# Patient Record
Sex: Female | Born: 1935 | Race: White | Hispanic: No | Smoking: Never smoker
Health system: Southern US, Community
[De-identification: ages and names within clinical notes are randomized; demographics above are authoritative.]

## PROBLEM LIST (undated history)

## (undated) DIAGNOSIS — E7800 Pure hypercholesterolemia, unspecified: Secondary | ICD-10-CM

## (undated) DIAGNOSIS — C801 Malignant (primary) neoplasm, unspecified: Secondary | ICD-10-CM

## (undated) DIAGNOSIS — M810 Age-related osteoporosis without current pathological fracture: Secondary | ICD-10-CM

## (undated) DIAGNOSIS — I10 Essential (primary) hypertension: Secondary | ICD-10-CM

## (undated) DIAGNOSIS — E119 Type 2 diabetes mellitus without complications: Secondary | ICD-10-CM

## (undated) HISTORY — PX: ABDOMINAL HYSTERECTOMY: SHX81

## (undated) HISTORY — PX: MASTECTOMY: SHX3

## (undated) HISTORY — PX: JOINT REPLACEMENT: SHX530

## (undated) HISTORY — PX: WRIST SURGERY: SHX841

## (undated) HISTORY — DX: Age-related osteoporosis without current pathological fracture: M81.0

---

## 2017-05-24 LAB — HM DIABETES EYE EXAM

## 2017-06-28 ENCOUNTER — Emergency Department (HOSPITAL_COMMUNITY): Payer: Medicare Other

## 2017-06-28 ENCOUNTER — Inpatient Hospital Stay (HOSPITAL_COMMUNITY)
Admission: EM | Admit: 2017-06-28 | Discharge: 2017-07-04 | DRG: 510 | Disposition: A | Discharge status: Skilled Nursing Facility | Payer: Medicare Other | Attending: Internal Medicine | Admitting: Internal Medicine

## 2017-06-28 ENCOUNTER — Encounter (HOSPITAL_COMMUNITY): Payer: Self-pay | Admitting: Emergency Medicine

## 2017-06-28 ENCOUNTER — Other Ambulatory Visit: Payer: Self-pay

## 2017-06-28 DIAGNOSIS — Z9221 Personal history of antineoplastic chemotherapy: Secondary | ICD-10-CM | POA: Diagnosis not present

## 2017-06-28 DIAGNOSIS — E785 Hyperlipidemia, unspecified: Secondary | ICD-10-CM | POA: Diagnosis not present

## 2017-06-28 DIAGNOSIS — T148XXA Other injury of unspecified body region, initial encounter: Secondary | ICD-10-CM | POA: Diagnosis not present

## 2017-06-28 DIAGNOSIS — Z01818 Encounter for other preprocedural examination: Secondary | ICD-10-CM

## 2017-06-28 DIAGNOSIS — S72112A Displaced fracture of greater trochanter of left femur, initial encounter for closed fracture: Secondary | ICD-10-CM | POA: Diagnosis not present

## 2017-06-28 DIAGNOSIS — Z853 Personal history of malignant neoplasm of breast: Secondary | ICD-10-CM

## 2017-06-28 DIAGNOSIS — Z419 Encounter for procedure for purposes other than remedying health state, unspecified: Secondary | ICD-10-CM

## 2017-06-28 DIAGNOSIS — N179 Acute kidney failure, unspecified: Secondary | ICD-10-CM | POA: Diagnosis not present

## 2017-06-28 DIAGNOSIS — D62 Acute posthemorrhagic anemia: Secondary | ICD-10-CM | POA: Diagnosis not present

## 2017-06-28 DIAGNOSIS — S72002A Fracture of unspecified part of neck of left femur, initial encounter for closed fracture: Secondary | ICD-10-CM

## 2017-06-28 DIAGNOSIS — I10 Essential (primary) hypertension: Secondary | ICD-10-CM | POA: Diagnosis present

## 2017-06-28 DIAGNOSIS — Y92009 Unspecified place in unspecified non-institutional (private) residence as the place of occurrence of the external cause: Secondary | ICD-10-CM

## 2017-06-28 DIAGNOSIS — F419 Anxiety disorder, unspecified: Secondary | ICD-10-CM | POA: Diagnosis present

## 2017-06-28 DIAGNOSIS — S72115A Nondisplaced fracture of greater trochanter of left femur, initial encounter for closed fracture: Secondary | ICD-10-CM | POA: Diagnosis present

## 2017-06-28 DIAGNOSIS — M9702XA Periprosthetic fracture around internal prosthetic left hip joint, initial encounter: Secondary | ICD-10-CM | POA: Diagnosis present

## 2017-06-28 DIAGNOSIS — S52022G Displaced fracture of olecranon process without intraarticular extension of left ulna, subsequent encounter for closed fracture with delayed healing: Secondary | ICD-10-CM | POA: Diagnosis not present

## 2017-06-28 DIAGNOSIS — Z9010 Acquired absence of unspecified breast and nipple: Secondary | ICD-10-CM

## 2017-06-28 DIAGNOSIS — S52022A Displaced fracture of olecranon process without intraarticular extension of left ulna, initial encounter for closed fracture: Secondary | ICD-10-CM | POA: Diagnosis not present

## 2017-06-28 DIAGNOSIS — W010XXA Fall on same level from slipping, tripping and stumbling without subsequent striking against object, initial encounter: Secondary | ICD-10-CM | POA: Diagnosis present

## 2017-06-28 DIAGNOSIS — Z7982 Long term (current) use of aspirin: Secondary | ICD-10-CM

## 2017-06-28 DIAGNOSIS — D649 Anemia, unspecified: Secondary | ICD-10-CM | POA: Diagnosis not present

## 2017-06-28 DIAGNOSIS — E119 Type 2 diabetes mellitus without complications: Secondary | ICD-10-CM | POA: Diagnosis not present

## 2017-06-28 DIAGNOSIS — F329 Major depressive disorder, single episode, unspecified: Secondary | ICD-10-CM | POA: Diagnosis present

## 2017-06-28 DIAGNOSIS — E782 Mixed hyperlipidemia: Secondary | ICD-10-CM

## 2017-06-28 DIAGNOSIS — Z7984 Long term (current) use of oral hypoglycemic drugs: Secondary | ICD-10-CM

## 2017-06-28 HISTORY — DX: Pure hypercholesterolemia, unspecified: E78.00

## 2017-06-28 HISTORY — DX: Type 2 diabetes mellitus without complications: E11.9

## 2017-06-28 HISTORY — DX: Malignant (primary) neoplasm, unspecified: C80.1

## 2017-06-28 HISTORY — DX: Essential (primary) hypertension: I10

## 2017-06-28 LAB — CBC WITH DIFFERENTIAL/PLATELET
BASOS ABS: 0 10*3/uL (ref 0.0–0.1)
Basophils Relative: 0 %
EOS PCT: 2 %
Eosinophils Absolute: 0.1 10*3/uL (ref 0.0–0.7)
HCT: 32.5 % — ABNORMAL LOW (ref 36.0–46.0)
Hemoglobin: 10.2 g/dL — ABNORMAL LOW (ref 12.0–15.0)
LYMPHS PCT: 10 %
Lymphs Abs: 0.7 10*3/uL (ref 0.7–4.0)
MCH: 29.2 pg (ref 26.0–34.0)
MCHC: 31.4 g/dL (ref 30.0–36.0)
MCV: 93.1 fL (ref 78.0–100.0)
MONO ABS: 0.5 10*3/uL (ref 0.1–1.0)
MONOS PCT: 7 %
Neutro Abs: 5.5 10*3/uL (ref 1.7–7.7)
Neutrophils Relative %: 81 %
PLATELETS: 180 10*3/uL (ref 150–400)
RBC: 3.49 MIL/uL — ABNORMAL LOW (ref 3.87–5.11)
RDW: 13.7 % (ref 11.5–15.5)
WBC: 6.8 10*3/uL (ref 4.0–10.5)

## 2017-06-28 LAB — BASIC METABOLIC PANEL
ANION GAP: 11 (ref 5–15)
BUN: 34 mg/dL — AB (ref 6–20)
CALCIUM: 8.7 mg/dL — AB (ref 8.9–10.3)
CO2: 26 mmol/L (ref 22–32)
CREATININE: 0.99 mg/dL (ref 0.44–1.00)
Chloride: 101 mmol/L (ref 101–111)
GFR calc Af Amer: >60 mL/min (ref >60)
GFR, EST NON AFRICAN AMERICAN: 53 mL/min — AB (ref >60)
GLUCOSE: 257 mg/dL — AB (ref 65–99)
Potassium: 4.1 mmol/L (ref 3.5–5.1)
Sodium: 138 mmol/L (ref 135–145)

## 2017-06-28 LAB — CBG MONITORING, ED: Glucose-Capillary: 225 mg/dL — ABNORMAL HIGH (ref 65–99)

## 2017-06-28 LAB — URINALYSIS, ROUTINE W REFLEX MICROSCOPIC
Bilirubin Urine: NEGATIVE
Glucose, UA: >=500 mg/dL — AB
Hgb urine dipstick: NEGATIVE
KETONES UR: NEGATIVE mg/dL
Nitrite: NEGATIVE
PROTEIN: NEGATIVE mg/dL
Specific Gravity, Urine: 1.02 (ref 1.005–1.030)
pH: 5 (ref 5.0–8.0)

## 2017-06-28 LAB — TYPE AND SCREEN
ABO/RH(D): O POS
Antibody Screen: NEGATIVE

## 2017-06-28 MED ORDER — SENNOSIDES-DOCUSATE SODIUM 8.6-50 MG PO TABS: 1.0000 | ORAL_TABLET | Freq: Every evening | ORAL | Status: DC | PRN

## 2017-06-28 MED ORDER — FENTANYL CITRATE (PF) 100 MCG/2ML IJ SOLN
50.0000 ug | Freq: Once | INTRAMUSCULAR | Status: AC
  Administered 2017-06-28: 50 ug via INTRAVENOUS
  Filled 2017-06-28: qty 2

## 2017-06-28 MED ORDER — INSULIN ASPART 100 UNIT/ML ~~LOC~~ SOLN
0.0000 [IU] | SUBCUTANEOUS | Status: DC
  Administered 2017-06-29 (×5): 3 [IU] via SUBCUTANEOUS
  Administered 2017-06-30: 5 [IU] via SUBCUTANEOUS
  Administered 2017-06-30 (×2): 2 [IU] via SUBCUTANEOUS
  Administered 2017-06-30: 3 [IU] via SUBCUTANEOUS
  Administered 2017-07-01: 8 [IU] via SUBCUTANEOUS
  Administered 2017-07-01: 3 [IU] via SUBCUTANEOUS
  Administered 2017-07-01 – 2017-07-02 (×3): 5 [IU] via SUBCUTANEOUS
  Administered 2017-07-02 (×3): 2 [IU] via SUBCUTANEOUS
  Administered 2017-07-03 (×3): 3 [IU] via SUBCUTANEOUS
  Administered 2017-07-03: 2 [IU] via SUBCUTANEOUS
  Administered 2017-07-03: 3 [IU] via SUBCUTANEOUS
  Administered 2017-07-03 – 2017-07-04 (×2): 2 [IU] via SUBCUTANEOUS
  Administered 2017-07-04: 3 [IU] via SUBCUTANEOUS

## 2017-06-28 MED ORDER — ESCITALOPRAM OXALATE 10 MG PO TABS
10.0000 mg | ORAL_TABLET | Freq: Every day | ORAL | Status: DC
  Administered 2017-06-29 – 2017-07-03 (×6): 10 mg via ORAL
  Filled 2017-06-28 (×6): qty 1

## 2017-06-28 MED ORDER — SODIUM CHLORIDE 0.9 % IV SOLN
INTRAVENOUS | Status: AC
  Administered 2017-06-29: 02:00:00 via INTRAVENOUS

## 2017-06-28 MED ORDER — FENTANYL CITRATE (PF) 100 MCG/2ML IJ SOLN
25.0000 ug | INTRAMUSCULAR | Status: AC | PRN
  Administered 2017-06-28 – 2017-06-29 (×3): 25 ug via INTRAVENOUS
  Filled 2017-06-28 (×3): qty 2

## 2017-06-28 MED ORDER — ADULT MULTIVITAMIN W/MINERALS CH
1.0000 | ORAL_TABLET | Freq: Every day | ORAL | Status: DC
  Administered 2017-06-30 – 2017-07-04 (×4): 1 via ORAL
  Filled 2017-06-28 (×4): qty 1

## 2017-06-28 MED ORDER — LORAZEPAM 0.5 MG PO TABS
0.5000 mg | ORAL_TABLET | Freq: Four times a day (QID) | ORAL | Status: DC | PRN
  Administered 2017-06-29 – 2017-07-04 (×5): 0.5 mg via ORAL
  Filled 2017-06-28 (×5): qty 1

## 2017-06-28 MED ORDER — HYDROCODONE-ACETAMINOPHEN 5-325 MG PO TABS
1.0000 | ORAL_TABLET | Freq: Four times a day (QID) | ORAL | Status: DC | PRN
  Administered 2017-06-29 – 2017-07-04 (×16): 2 via ORAL
  Filled 2017-06-28 (×17): qty 2

## 2017-06-28 MED ORDER — SIMVASTATIN 10 MG PO TABS
10.0000 mg | ORAL_TABLET | Freq: Every day | ORAL | Status: DC
  Administered 2017-06-29 – 2017-07-03 (×5): 10 mg via ORAL
  Filled 2017-06-28 (×5): qty 1

## 2017-06-28 MED ORDER — MORPHINE SULFATE (PF) 2 MG/ML IV SOLN
0.5000 mg | INTRAVENOUS | Status: DC | PRN
  Administered 2017-06-29 – 2017-07-02 (×4): 1 mg via INTRAVENOUS
  Filled 2017-06-28 (×4): qty 1

## 2017-06-28 MED ORDER — HYDRALAZINE HCL 20 MG/ML IJ SOLN: 10.0000 mg | INTRAMUSCULAR | Status: DC | PRN

## 2017-06-28 MED ORDER — BISACODYL 5 MG PO TBEC
5.0000 mg | DELAYED_RELEASE_TABLET | Freq: Every day | ORAL | Status: DC | PRN
  Administered 2017-07-03: 5 mg via ORAL
  Filled 2017-06-28: qty 1

## 2017-06-28 NOTE — ED Provider Notes (Signed)
[facility] EMERGENCY DEPARTMENT Provider Note   CSN: [id number] Arrival date & time: 06/28/17  1815     History   Chief Complaint Chief Complaint  Patient presents with  . Fall    HPI Aleesa Sweigert is a 81 y.o. female.  HPI  The patient is a 81 year old female with a known history of breast cancer diabetes high blood cholesterol and high blood pressure.  She is currently taking a daily aspirin.  She presents with family after she had a fall at home when she was walking on an uneven surface and fell forward striking the left side of her body including her left elbow, left hip and the left periorbital region.  This occurred approximately 1-1/2 hours ago, her symptoms were acute in onset, persistent, worse with movement of the left elbow and not associated with any loss of consciousness, headache, blurred vision, nausea, vomiting, changes in vision, chest pain shortness of breath abdominal pain.  She did suffer a slight abrasion to the knuckles of her left hand.  She was brought immediately to the hospital by her family members.  Past Medical History:  Diagnosis Date  . Cancer ([place])    breast  . Diabetes mellitus without complication ([place])   . High blood cholesterol   . Hypertension     Patient Active Problem List   Diagnosis Date Noted  . Diabetes mellitus without complication ([place]) 37/62/8315  . Hypertension 06/28/2017  . Closed left hip fracture, initial encounter ([place]) 06/28/2017  . Olecranon fracture, left, closed, initial encounter 06/28/2017    Past Surgical History:  Procedure Laterality Date  . ABDOMINAL HYSTERECTOMY    . JOINT REPLACEMENT    . MASTECTOMY    . WRIST SURGERY      OB History    No data available       Home Medications    Prior to Admission medications   Medication Sig Start Date End Date Taking? Authorizing Provider  aspirin EC 81 MG tablet Take 81 mg by mouth daily.   Yes [provider]  escitalopram (LEXAPRO) 10 MG tablet  Take 10 mg by mouth at bedtime.   Yes [provider]  glipiZIDE (GLUCOTROL) 10 MG tablet Take 10 mg by mouth 2 (two) times daily.   Yes [provider]  lisinopril-hydrochlorothiazide (PRINZIDE,ZESTORETIC) 20-12.5 MG tablet Take 1 tablet by mouth daily.   Yes [provider]  LORazepam (ATIVAN) 0.5 MG tablet Take 0.5 mg by mouth daily.   Yes [provider]  metFORMIN (GLUCOPHAGE) 1000 MG tablet Take 1,000 mg by mouth 2 (two) times daily.   Yes [provider]  Multiple Vitamin (MULTIVITAMIN WITH MINERALS) TABS tablet Take 1 tablet by mouth daily.   Yes [provider]  Multiple Vitamins-Minerals (PRESERVISION AREDS 2+MULTI VIT) CAPS Take 2 capsules by mouth daily.   Yes [provider]  pioglitazone (ACTOS) 30 MG tablet Take 30 mg by mouth daily.   Yes [provider]  simvastatin (ZOCOR) 10 MG tablet Take 10 mg by mouth daily.   Yes [provider]    Family History History reviewed. No pertinent family history.  Social History Social History   Tobacco Use  . Smoking status: Never Smoker  Substance Use Topics  . Alcohol use: No    Frequency: Never  . Drug use: No     Allergies   Penicillins   Review of Systems Review of Systems  All other systems reviewed and are negative.    Physical  Exam Updated Vital Signs BP (!) 157/79 (BP Location: Right Arm)   Pulse 81   Temp 98.2 F (36.8 C) (Oral)   Resp 20   Ht 5\' 7"  (1.702 m)   Wt 61.2 kg (135 lb)   SpO2 98%   BMI 21.14 kg/m   Physical Exam  Constitutional: She appears well-developed and well-nourished. No distress.  HENT:  Head: Normocephalic.  Mouth/Throat: Oropharynx is clear and moist. No oropharyngeal exudate.  Small hematoma to the left superior lateral periorbital region.  No malocclusion, no raccoon eyes, no battle sign  Eyes: Conjunctivae and EOM are normal. Pupils are equal, round, and reactive to light. Right eye exhibits no  discharge. Left eye exhibits no discharge. No scleral icterus.  Neck: Normal range of motion. Neck supple. No JVD present. No thyromegaly present.  Cardiovascular: Normal rate, regular rhythm, normal heart sounds and intact distal pulses. Exam reveals no gallop and no friction rub.  No murmur heard. Pulmonary/Chest: Effort normal and breath sounds normal. No respiratory distress. She has no wheezes. She has no rales.  Abdominal: Soft. Bowel sounds are normal. She exhibits no distension and no mass. There is no tenderness.  Musculoskeletal: Normal range of motion. She exhibits edema ( Bilateral mild symmetrical edema of the lower extremities), tenderness ( Tender to palpation over the left elbow and the left hip) and deformity ( Left elbow swelling and tenderness especially over the olecranon).  Minimal pain with pronation and supination of the elbow, there is no pain around the left shoulder, no pain at the left hand or wrist, there is tenderness with flexion and extension of the elbow.  She has normal range of motion of the right lower extremity, she has some difficulty flexing at the left hip and some pain with internal rotation though there is no leg length discrepancies.  Lymphadenopathy:    She has no cervical adenopathy.  Neurological: She is alert. Coordination normal.  No numbness or weakness.  Skin: Skin is warm and dry. No rash noted. No erythema.  Abrasion noted to knuckles, no other skin tears or lacerations  Psychiatric: She has a normal mood and affect. Her behavior is normal.  Nursing note and vitals reviewed.    ED Treatments / Results  Labs (all labs ordered are listed, but only abnormal results are displayed) Labs Reviewed  CBC WITH DIFFERENTIAL/PLATELET - Abnormal; Notable for the following components:      Result Value   RBC 3.49 (*)    Hemoglobin 10.2 (*)    HCT 32.5 (*)    All other components within normal limits  BASIC METABOLIC PANEL - Abnormal; Notable for the  following components:   Glucose, Bld 257 (*)    BUN 34 (*)    Calcium 8.7 (*)    GFR calc non Af Amer 53 (*)    All other components within normal limits  CBG MONITORING, ED - Abnormal; Notable for the following components:   Glucose-Capillary 225 (*)    All other components within normal limits  URINALYSIS, ROUTINE W REFLEX MICROSCOPIC  TYPE AND SCREEN     Radiology Dg Elbow Complete Left (3+view)  Result Date: 06/28/2017 CLINICAL DATA:  Fall with left hip and elbow pain. Initial encounter. EXAM: LEFT ELBOW - COMPLETE 3+ VIEW COMPARISON:  None. FINDINGS: Acute fracture of the olecranon process of the proximal left ulna present which is displaced significantly. The olecranon fragment is rotated and posteriorly displaced by nearly 2 cm. Associated significant overlying soft tissue swelling and  joint effusion present. No dislocation. No other fractures are identified. IMPRESSION: Acute fracture of the olecranon process of the proximal left ulna with significant displacement of the olecranon fragment. Electronically Signed   By: Aletta Edouard M.D.   On: 06/28/2017 19:26   Ct Head Wo Contrast  Result Date: 06/28/2017 CLINICAL DATA:  Status post.  Left-sided EXAM: CT HEAD WITHOUT CONTRAST TECHNIQUE: Contiguous axial images were obtained from the base of the skull through the vertex without intravenous contrast. COMPARISON:  None. FINDINGS: Brain: No evidence of acute infarction, hemorrhage, extra-axial collection, ventriculomegaly, or mass effect. Generalized cerebral atrophy. Periventricular white matter low attenuation likely secondary to microangiopathy. Vascular: Cerebrovascular atherosclerotic calcifications are noted. Skull: Negative for fracture or focal lesion. Sinuses/Orbits: Visualized portions of the orbits are unremarkable. Visualized portions of the paranasal sinuses and mastoid air cells are unremarkable. Other: None. IMPRESSION: No acute intracranial pathology. Electronically  Signed   By: Kathreen Devoid   On: 06/28/2017 19:13   Dg Hip Unilat W Or Wo Pelvis 2-3 Views Left  Result Date: 06/28/2017 CLINICAL DATA:  Fall with left hip and elbow pain. Initial encounter. EXAM: DG HIP (WITH OR WITHOUT PELVIS) 2-3V LEFT COMPARISON:  None. FINDINGS: Bipolar arthroplasty of the left hip shows normal alignment. There is a probable nondisplaced fracture extending through the adjacent greater trochanter. The bony pelvis appears intact without fracture or diastasis. IMPRESSION: Suspect nondisplaced fracture of the left greater trochanter adjacent to an indwelling bipolar arthroplasty which shows normal alignment. Electronically Signed   By: Aletta Edouard M.D.   On: 06/28/2017 19:24    Procedures Procedures (including critical care time)  Medications Ordered in ED Medications  fentaNYL (SUBLIMAZE) injection 50 mcg (50 mcg Intravenous Given 06/28/17 1949)     Initial Impression / Assessment and Plan / ED Course  I have reviewed the triage vital signs and the nursing notes.  Pertinent labs & imaging results that were available during my care of the patient were reviewed by me and considered in my medical decision making (see chart for details).     The patient ultimately will need evaluation for fractures of the hip elbow and potential intracranial injury given the hematoma on chronic anticoagulant therapy with aspirin.  She has no cervical spine tenderness, pain medications ordered, check blood sugar as she is a diabetic and has had very little to eat today  D/w Dr. Doreatha Martin on call for Ortho at 7:45 PM - recommends outpt surgery if able - but with 2 extremity injuries, may need inpatient rehab, mobilization and placement with surgery while inpatient if can't ambulate safely.  Greater troch frx to be treated conservatively avoiding abduction  Patient is agreeable to be admitted that she was unable to ambulate safely.  She has minimal hyperglycemia but no acidosis, blood counts  show mild anemia but no leukocytosis and her x-rays confirmed the fractures as above.  Discussed with orthopedics who will consult tomorrow morning, discussed with hospitalist, Dr. Noberto Retort, will coordinate the transfer to [facility].  Final Clinical Impressions(s) / ED Diagnoses   Final diagnoses:  Closed fracture of olecranon process of left ulna, initial encounter  Closed displaced fracture of greater trochanter of left femur, initial encounter [facility])    ED Discharge Orders    None       Noemi Chapel, MD 06/28/17 2053

## 2017-06-28 NOTE — H&P (Addendum)
History and Physical    Saul Dorsi HCW:[id number] DOB: 12/26/1935 DOA: 06/28/2017  PCP: Alanda Amass, MD   Patient coming from: Home  Chief Complaint: Fall with severe left hip and left elbow pain   HPI: Melanie Hughes is a 81 y.o. female with medical history significant for hypertension, type 2 diabetes mellitus, and history of breast cancer, now presenting to the emergency department with severe left hip and elbow pain after a ground-level mechanical fall.  She had been in her usual state of health and was having an uneventful day until she walked over an uneven surface, tripped, falling onto her left side, and experiencing immediate pain at the left elbow, hip, and periorbital region.  There was no loss of consciousness and she denies headache, vision change, nausea, vomiting, or focal numbness or weakness.  No recent chest pain, dyspnea, or cough.  She was brought into the ED by family.  ED Course: Upon arrival to the ED, patient is found to be afebrile, saturating well on room air, and with stable blood pressure.  EKG features a sinus rhythm, chest x-ray is negative for acute cardiopulmonary disease, and noncontrast head CT is negative for acute intracranial abnormality.  Radiographs of the left hip are suspicious for a nondisplaced fracture of the left greater trochanter adjacent to a bipolar density which remains intact.  Radiographs of the left elbow demonstrate an acute fracture of the left olecranon process with significant displacement of the olecranon fragment.  Chemistry panel is notable for a glucose of 257 and elevated BUN to creatinine ratio.  CBC reveals a normocytic 2 and no prior CBC for comparison.  Patient was treated with fentanyl in the ED and orthopedic surgery was consulted.  Orthopedic surgery recommends medical admission to [facility] where they will consult on the patient for possible operative repair.  Review of Systems:  All other systems reviewed and  apart from HPI, are negative.  Past Medical History:  Diagnosis Date  . Cancer ([place])    breast  . Diabetes mellitus without complication ([place])   . High blood cholesterol   . Hypertension     Past Surgical History:  Procedure Laterality Date  . ABDOMINAL HYSTERECTOMY    . JOINT REPLACEMENT    . MASTECTOMY    . WRIST SURGERY       reports that  has never smoked. she has never used smokeless tobacco. She reports that she does not drink alcohol or use drugs.  Allergies  Allergen Reactions  . Penicillins Itching    Family History  Problem Relation Age of Onset  . Obesity Grandchild      Prior to Admission medications   Medication Sig Start Date End Date Taking? Authorizing Provider  aspirin EC 81 MG tablet Take 81 mg by mouth daily.   Yes [provider]  escitalopram (LEXAPRO) 10 MG tablet Take 10 mg by mouth at bedtime.   Yes [provider]  glipiZIDE (GLUCOTROL) 10 MG tablet Take 10 mg by mouth 2 (two) times daily.   Yes [provider]  lisinopril-hydrochlorothiazide (PRINZIDE,ZESTORETIC) 20-12.5 MG tablet Take 1 tablet by mouth daily.   Yes [provider]  LORazepam (ATIVAN) 0.5 MG tablet Take 0.5 mg by mouth daily.   Yes [provider]  metFORMIN (GLUCOPHAGE) 1000 MG tablet Take 1,000 mg by mouth 2 (two) times daily.   Yes [provider]  Multiple Vitamin (MULTIVITAMIN WITH MINERALS) TABS tablet Take 1 tablet by mouth daily.   Yes  [provider]  Multiple Vitamins-Minerals (PRESERVISION AREDS 2+MULTI VIT) CAPS Take 2 capsules by mouth daily.   Yes [provider]  pioglitazone (ACTOS) 30 MG tablet Take 30 mg by mouth daily.   Yes [provider]  simvastatin (ZOCOR) 10 MG tablet Take 10 mg by mouth daily.   Yes [provider]    Physical Exam: Vitals:   06/28/17 1825 06/28/17 2000 06/28/17 2136  BP: (!) 157/79 (!) 118/58 112/63  Pulse: 81 76 71  Resp: 20  (!) 28  Temp:  98.2 F (36.8 C)    TempSrc: Oral    SpO2: 98% 95% 98%  Weight: 61.2 kg (135 lb)    Height: 5\' 7"  (1.702 m)        Constitutional: NAD, calm, in apparent discomfort Eyes: PERTLA, lids and conjunctivae normal ENMT: Mucous membranes are moist. Posterior pharynx clear of any exudate or lesions.   Neck: normal, supple, no masses, no thyromegaly Respiratory: clear to auscultation bilaterally, no wheezing, no crackles. Normal respiratory effort.   Cardiovascular: S1 & S2 heard, regular rate and rhythm. No extremity edema. 2+ pedal pulses. No significant JVD. Abdomen: No distension, no tenderness, no masses palpated. Bowel sounds normal.  Musculoskeletal: Left hip and elbow exquisitely tender and NVI distally. Normal muscle tone.  Skin: Left periorbital ecchymosis. Skin is otherwise warm, dry, well-perfused. Neurologic: CN 2-12 grossly intact. Sensation intact. Strength 5/5 in all 4 limbs.  Psychiatric: Alert and oriented x 3. Pleasant and cooperative.     Labs on Admission: I have personally reviewed following labs and imaging studies  CBC: Recent Labs  Lab 06/28/17 1956  WBC 6.8  NEUTROABS 5.5  HGB 10.2*  HCT 32.5*  MCV 93.1  PLT 299   Basic Metabolic Panel: Recent Labs  Lab 06/28/17 1956  NA 138  K 4.1  CL 101  CO2 26  GLUCOSE 257*  BUN 34*  CREATININE 0.99  CALCIUM 8.7*   GFR: Estimated Creatinine Clearance: 44.5 mL/min (by C-G formula based on SCr of 0.99 mg/dL). Liver Function Tests: No results for input(s): AST, ALT, ALKPHOS, BILITOT, PROT, ALBUMIN in the last 168 hours. No results for input(s): LIPASE, AMYLASE in the last 168 hours. No results for input(s): AMMONIA in the last 168 hours. Coagulation Profile: No results for input(s): INR, PROTIME in the last 168 hours. Cardiac Enzymes: No results for input(s): CKTOTAL, CKMB, CKMBINDEX, TROPONINI in the last 168 hours. BNP (last 3 results) No results for input(s): PROBNP in the last 8760 hours. HbA1C: No  results for input(s): HGBA1C in the last 72 hours. CBG: Recent Labs  Lab 06/28/17 2003  GLUCAP 225*   Lipid Profile: No results for input(s): CHOL, HDL, LDLCALC, TRIG, CHOLHDL, LDLDIRECT in the last 72 hours. Thyroid Function Tests: No results for input(s): TSH, T4TOTAL, FREET4, T3FREE, THYROIDAB in the last 72 hours. Anemia Panel: No results for input(s): VITAMINB12, FOLATE, FERRITIN, TIBC, IRON, RETICCTPCT in the last 72 hours. Urine analysis:    Component Value Date/Time   COLORURINE YELLOW 06/28/2017 2046   APPEARANCEUR HAZY (A) 06/28/2017 2046   LABSPEC 1.020 06/28/2017 2046   PHURINE 5.0 06/28/2017 2046   GLUCOSEU >=500 (A) 06/28/2017 2046   HGBUR NEGATIVE 06/28/2017 2046   BILIRUBINUR NEGATIVE 06/28/2017 2046   KETONESUR NEGATIVE 06/28/2017 2046   PROTEINUR NEGATIVE 06/28/2017 2046   NITRITE NEGATIVE 06/28/2017 2046   LEUKOCYTESUR MODERATE (A) 06/28/2017 2046   Sepsis Labs: @LABRCNTIP (procalcitonin:4,lacticidven:4) )No results found for this or any previous visit (from the past  240 hour(s)).   Radiological Exams on Admission: Dg Elbow Complete Left (3+view)  Result Date: 06/28/2017 CLINICAL DATA:  Fall with left hip and elbow pain. Initial encounter. EXAM: LEFT ELBOW - COMPLETE 3+ VIEW COMPARISON:  None. FINDINGS: Acute fracture of the olecranon process of the proximal left ulna present which is displaced significantly. The olecranon fragment is rotated and posteriorly displaced by nearly 2 cm. Associated significant overlying soft tissue swelling and joint effusion present. No dislocation. No other fractures are identified. IMPRESSION: Acute fracture of the olecranon process of the proximal left ulna with significant displacement of the olecranon fragment. Electronically Signed   By: Aletta Edouard M.D.   On: 06/28/2017 19:26   Ct Head Wo Contrast  Result Date: 06/28/2017 CLINICAL DATA:  Status post.  Left-sided EXAM: CT HEAD WITHOUT CONTRAST TECHNIQUE: Contiguous  axial images were obtained from the base of the skull through the vertex without intravenous contrast. COMPARISON:  None. FINDINGS: Brain: No evidence of acute infarction, hemorrhage, extra-axial collection, ventriculomegaly, or mass effect. Generalized cerebral atrophy. Periventricular white matter low attenuation likely secondary to microangiopathy. Vascular: Cerebrovascular atherosclerotic calcifications are noted. Skull: Negative for fracture or focal lesion. Sinuses/Orbits: Visualized portions of the orbits are unremarkable. Visualized portions of the paranasal sinuses and mastoid air cells are unremarkable. Other: None. IMPRESSION: No acute intracranial pathology. Electronically Signed   By: Kathreen Devoid   On: 06/28/2017 19:13   Dg Chest Port 1 View  Result Date: 06/28/2017 CLINICAL DATA:  Hip fracture. EXAM: PORTABLE CHEST 1 VIEW COMPARISON:  None. FINDINGS: Mild cardiomegaly is noted. No acute pulmonary disease is noted. No pneumothorax or pleural effusion is noted. Status post left shoulder arthroplasty. IMPRESSION: No acute cardiopulmonary abnormality seen. Electronically Signed   By: Marijo Conception, M.D.   On: 06/28/2017 21:19   Dg Hip Unilat W Or Wo Pelvis 2-3 Views Left  Result Date: 06/28/2017 CLINICAL DATA:  Fall with left hip and elbow pain. Initial encounter. EXAM: DG HIP (WITH OR WITHOUT PELVIS) 2-3V LEFT COMPARISON:  None. FINDINGS: Bipolar arthroplasty of the left hip shows normal alignment. There is a probable nondisplaced fracture extending through the adjacent greater trochanter. The bony pelvis appears intact without fracture or diastasis. IMPRESSION: Suspect nondisplaced fracture of the left greater trochanter adjacent to an indwelling bipolar arthroplasty which shows normal alignment. Electronically Signed   By: Aletta Edouard M.D.   On: 06/28/2017 19:24    EKG: Independently reviewed. Sinus rhythm.   Assessment/Plan  1. Closed left greater trochanter and left olecranon  fractures - Presents with severe pain in left hip and left elbow after a ground-level mechanical fall at home - Radiographs demonstrate fractures involving left olecranon process and left greater trochanter; head CT negative  - She is unable to bear weight in ED  - Orthopedic surgery is consulting and much appreciated  - Pt typically enjoys good health, is active and independent, and ascends a flight of stairs without difficulty prior to injury - Based on available data, Ms. Clyne presents an estimated 0.3% risk probability for perioperative MI or cardiac arrest per Melburn Hake al; this is low-risk and no further testing recommended prior to surgery   - Plan to keep NPO after midnight, provide IVF hydration, prn analgesia, supportive care   2. Type II DM  - No A1c on file  - Managed at home with glipizide, metformin, and Actos, held on admission  - Check CBG's and start a SSI with Novolog   3. Hypertension  -  BP at goal  - Hold lisinopril and HCTZ, use hydralazine IVP's prn overnight   4. Anxiety  - Stable  - Continue Lexapro and Ativan    5. Anemia  - Hgb 10.2 on admission with no prior CBC on record  - No bleeding is evident  - Type and screen, repeat CBC in am    6. Breast cancer  - Status-post remote mastectomy and chemotherapy  - Continues follow-up with no recurrence    DVT prophylaxis: SCD's Code Status: Full  Family Communication: Family updated at bedside Disposition Plan: Admit to med-surg at [facility] Consults called: Orthopedic surgery Admission status: Inpatient    Vianne Bulls, MD [facility] Pager [phone]  If 7PM-7AM, please contact night-coverage [email or web address] Password [facility]  06/28/2017, 11:25 PM

## 2017-06-28 NOTE — ED Notes (Signed)
Attempted to ambulate pt with [place], Therapist, sports. Pt did not tolerate ambulation. MD Aware.

## 2017-06-28 NOTE — ED Notes (Signed)
Pt transported to xray/CT

## 2017-06-28 NOTE — Progress Notes (Signed)
Case discussed with Dr. Sabra Heck. Recommend nonoperative treatment ofleft greater trochanter fracture. She may be WBAT with limited abduction. Will need definitive ORIF of her olecranon which can be done on outpatient basis. I am concerned regarding polyextremity injuries in elderly patient and ability to mobilize in ER to discharge. Discussed my concerns with Dr. Sabra Heck and patient will be mobilized in ER and if unable to adequately do this she will be admitted and I will likely perform her ORIF during her hospitalization. If she discharges, I recommend follow up in my office for discussion of ORIF.  Shona Needles, MD [facility] [phone] (phone)

## 2017-06-28 NOTE — ED Triage Notes (Signed)
Pt stepped off of the sidewalk at family dinner and fell on left side in grass.  C/o left leg/hip pain, left elbow pain, and hematoma to left eye.

## 2017-06-29 DIAGNOSIS — E785 Hyperlipidemia, unspecified: Secondary | ICD-10-CM

## 2017-06-29 DIAGNOSIS — E782 Mixed hyperlipidemia: Secondary | ICD-10-CM

## 2017-06-29 DIAGNOSIS — Z853 Personal history of malignant neoplasm of breast: Secondary | ICD-10-CM

## 2017-06-29 DIAGNOSIS — D649 Anemia, unspecified: Secondary | ICD-10-CM

## 2017-06-29 DIAGNOSIS — S52022G Displaced fracture of olecranon process without intraarticular extension of left ulna, subsequent encounter for closed fracture with delayed healing: Secondary | ICD-10-CM

## 2017-06-29 LAB — MAGNESIUM: MAGNESIUM: 1.5 mg/dL — AB (ref 1.7–2.4)

## 2017-06-29 LAB — COMPREHENSIVE METABOLIC PANEL
ALK PHOS: 50 U/L (ref 38–126)
ALT: 11 U/L — ABNORMAL LOW (ref 14–54)
ANION GAP: 10 (ref 5–15)
AST: 16 U/L (ref 15–41)
Albumin: 3.1 g/dL — ABNORMAL LOW (ref 3.5–5.0)
BILIRUBIN TOTAL: 0.8 mg/dL (ref 0.3–1.2)
BUN: 25 mg/dL — ABNORMAL HIGH (ref 6–20)
CALCIUM: 8.4 mg/dL — AB (ref 8.9–10.3)
CO2: 27 mmol/L (ref 22–32)
Chloride: 103 mmol/L (ref 101–111)
Creatinine, Ser: 0.74 mg/dL (ref 0.44–1.00)
GFR calc Af Amer: >60 mL/min (ref >60)
Glucose, Bld: 92 mg/dL (ref 65–99)
POTASSIUM: 3.7 mmol/L (ref 3.5–5.1)
Sodium: 140 mmol/L (ref 135–145)
TOTAL PROTEIN: 5.7 g/dL — AB (ref 6.5–8.1)

## 2017-06-29 LAB — GLUCOSE, CAPILLARY
GLUCOSE-CAPILLARY: 165 mg/dL — AB (ref 65–99)
GLUCOSE-CAPILLARY: 199 mg/dL — AB (ref 65–99)
GLUCOSE-CAPILLARY: 108 mg/dL — AB (ref 65–99)
Glucose-Capillary: 101 mg/dL — ABNORMAL HIGH (ref 65–99)
Glucose-Capillary: 168 mg/dL — ABNORMAL HIGH (ref 65–99)
Glucose-Capillary: 175 mg/dL — ABNORMAL HIGH (ref 65–99)

## 2017-06-29 LAB — CBC WITH DIFFERENTIAL/PLATELET
Basophils Absolute: 0 10*3/uL (ref 0.0–0.1)
Basophils Relative: 0 %
Eosinophils Absolute: 0 10*3/uL (ref 0.0–0.7)
Eosinophils Relative: 0 %
HEMATOCRIT: 27.8 % — AB (ref 36.0–46.0)
Hemoglobin: 8.9 g/dL — ABNORMAL LOW (ref 12.0–15.0)
LYMPHS ABS: 0.7 10*3/uL (ref 0.7–4.0)
LYMPHS PCT: 10 %
MCH: 29.3 pg (ref 26.0–34.0)
MCHC: 32 g/dL (ref 30.0–36.0)
MCV: 91.4 fL (ref 78.0–100.0)
MONO ABS: 0.7 10*3/uL (ref 0.1–1.0)
MONOS PCT: 9 %
NEUTROS ABS: 5.8 10*3/uL (ref 1.7–7.7)
Neutrophils Relative %: 81 %
Platelets: 156 10*3/uL (ref 150–400)
RBC: 3.04 MIL/uL — ABNORMAL LOW (ref 3.87–5.11)
RDW: 14.3 % (ref 11.5–15.5)
WBC: 7.3 10*3/uL (ref 4.0–10.5)

## 2017-06-29 LAB — PHOSPHORUS: Phosphorus: 3.6 mg/dL (ref 2.5–4.6)

## 2017-06-29 LAB — SURGICAL PCR SCREEN
MRSA, PCR: NEGATIVE
STAPHYLOCOCCUS AUREUS: NEGATIVE

## 2017-06-29 MED ORDER — ASPIRIN EC 81 MG PO TBEC
81.0000 mg | DELAYED_RELEASE_TABLET | Freq: Every day | ORAL | Status: DC
  Administered 2017-06-29 – 2017-07-04 (×5): 81 mg via ORAL
  Filled 2017-06-29 (×5): qty 1

## 2017-06-29 MED ORDER — MAGNESIUM SULFATE 2 GM/50ML IV SOLN
2.0000 g | Freq: Once | INTRAVENOUS | Status: AC
  Administered 2017-06-29: 2 g via INTRAVENOUS
  Filled 2017-06-29: qty 50

## 2017-06-29 NOTE — Progress Notes (Signed)
PROGRESS NOTE    Melanie Hughes  KXF:[id number] DOB: Jun 06, 1936 DOA: 06/28/2017 PCP: Alanda Amass, MD   Brief Narrative:  Melanie Hughes is a 81 y.o. female with medical history significant for Hypertension, Type 2 Diabetes Mellitus, History of Breast Cancer and other comorbids, now presenting to the emergency department with severe left hip and elbow pain after a ground-level mechanical fall.  She had been in her usual state of health and was having an uneventful day until she walked over an uneven surface, tripped, falling onto her left side, and experiencing immediate pain at the left elbow, hip, and periorbital region.  There was no loss of consciousness and she denies headache, vision change, nausea, vomiting, or focal numbness or weakness. She was brought to the ED  For evaluation and upon arrival to the ED, patient was found to be afebrile, saturating well on room air, and with stable blood pressure.  EKG featured a sinus rhythm, chest x-ray is negative for acute cardiopulmonary disease, and noncontrast head CT is negative for acute intracranial abnormality.    Radiographs of the left hip were suspicious for a nondisplaced fracture of the left greater trochanter adjacent to a bipolar density which remains intact.  Radiographs of the left elbow demonstrated an acute fracture of the left olecranon process with significant displacement of the olecranon fragment. Patient was treated with fentanyl in the ED and Orthopedic Surgery was consulted.  Orthopedic surgery recommended medical admission to [facility] and patient to undergo ORIF of Left Olecranon Fx on 07/01/17 by Dr. Doreatha Martin.  Assessment & Plan:   Principal Problem:   Closed left hip fracture, initial encounter [facility]) Active Problems:   Diabetes mellitus without complication ([place])   Hypertension   Closed fracture of left olecranon process   Anxiety   Normocytic anemia   Hx of breast cancer   Hypomagnesemia    Hyperlipidemia  Closed Left Greater Trochanter and Left Olecranon Fractures - Presented with severe pain in left hip and left elbow after a ground-level mechanical fall at home - Radiographs demonstrate fractures involving left olecranon process and left greater trochanter; Hhead CT negative  - She is unable to bear weight in ED  - Orthopedic surgery is consulting and appreciate additional Recc's; Patient to under go ORIF for Left Olecranon Fx on 07/01/17 and will need to be NPO after Midnight Thursday and NWB and Sling -Per Ortho Left Greater Trochanter Fx does not need intervention and recommends non-operative treatment and can be WBAT with no active Abduction - Pt typically enjoys good health, is active and independent, and ascends a flight of stairs without difficulty prior to injury - Based on available data, Ms. Klonowski presents an estimated 0.3% risk probability for perioperative MI or cardiac arrest per Melburn Hake al; this is low-risk and no further testing recommended prior to surgery   - Plan to keep NPO after midnight after Thursday but interm allow patient a Carb Modified Diet - Analgesia and Pain control with Hydro-codone-Acetaminophen 1-2 tab po q6hprn Moderate Pain and with IV Morphine 0.5-1 mg q2hprn Severe Pain -Bowel Regimen with Senna-Docusate 1 tab po qHSprn for mild Constipation and Bisacodyl EC tablet 5 mg po Dailyprn Moderate Constipation  -Defer VTE Prophylaxis to Orthopedics.   Type II DM  -No A1c on file; Check HbA1c in AM  -Managed at home with Glipizide, Metformin, and Actos, held on admission  -C/w Moderate Novolog Insulin SSI q4h -CBG's ranging from 101-168  Hypertension  -BP at goal at 125/88 -Hold  Home Llisinopril and HCTZ,  -Continue to use Hydralazine 10 mg IV q4hprn for SBP >165 or DBP >90  Depression and Anxiety  -Stable  -Continue Escitalopram 10 mg po qHS and Lorazepam 0.5 mg po q6hprn Anxiety/Sleep   Normocytic Anemia  - Hgb 10.2 on admission  with no prior CBC on record  - No bleeding is evident; Continue to Monitor for S/Sx of Bleeding -Hb/Hct went from 10.2/32.5 -> 8.9/27.8 -Type and screen,  -Repeat CBC in am    Hx of Breast Cancer  -Status-post remote mastectomy and chemotherapy  -Continues follow-up with no recurrence   Hypomagnesemia -Patient's Mag Level was 1.5 -Replete with IV Mag Sulfate 2 grams -Continue to Monitor and Replete as Necessary -Repeat Mag Level in AM  HLD -Check FLP in AM -C/w Simvastatin 10 mg po Daily  DVT prophylaxis: SCDs Code Status: FULL CODE Family Communication: Discussed with son at bedside Disposition Plan: Remain Inpatient for ORIF on 07/01/17  Consultants:  Orthopedic Surgery Dr. Doreatha Martin  Procedures:  None   Antimicrobials:  Anti-infectives (From admission, onward)   None     Subjective: Seen and examined at bedside and states pain was ok. Was hungry and wanted to eat. No CP or SOB. No other concerns or complaints at this time.   Objective: Vitals:   06/28/17 2300 06/28/17 2330 06/29/17 0000 06/29/17 0128  BP: (!) 107/57 (!) 141/67 110/67 125/88  Pulse: 70 76 73 74  Resp: (!) 29 (!) 23 (!) 22 20  Temp:    98.1 F (36.7 C)  TempSrc:    Oral  SpO2: 98% 100% 97% 99%  Weight:      Height:        Intake/Output Summary (Last 24 hours) at 06/29/2017 1235 Last data filed at 06/29/2017 0500 Gross per 24 hour  Intake 435 ml  Output -  Net 435 ml   Filed Weights   06/28/17 1825  Weight: 61.2 kg (135 lb)   Examination: Physical Exam:  Constitutional: WN/WD Caucasian female in NAD and appears calm Eyes: Has a bruise on Left eye. Sclerae anicteric ENMT: External Ears, Nose appear normal. Grossly normal hearing. Mucous membranes are moist.   Neck: Appears normal, supple, no cervical masses, normal ROM, no appreciable thyromegaly Respiratory: Clear to auscultation bilaterally, no wheezing, rales, rhonchi or crackles. Normal respiratory effort and patient is not  tachypenic. No accessory muscle use.  Cardiovascular: RRR, no murmurs / rubs / gallops. S1 and S2 auscultated.  carotid bruits.  Abdomen: Soft, non-tender, non-distended. No masses palpated. No appreciable hepatosplenomegaly. Bowel sounds positive x4.  GU: Deferred. Musculoskeletal: No clubbing / cyanosis of digits/nails. Wearing a Sling in Left Arm.  Skin: No rashes, lesions, ulcers on a limited skin eval. No induration; Warm and dry.  Neurologic: CN 2-12 grossly intact with no focal deficits. Sensation intact in all 4 Extremities. Romberg sign and cerebellar reflexes not assessed.  Psychiatric: Normal judgment and insight. Alert and oriented x 3. Normal mood and appropriate affect.   Data Reviewed: I have personally reviewed following labs and imaging studies  CBC: Recent Labs  Lab 06/28/17 1956 06/29/17 0852  WBC 6.8 7.3  NEUTROABS 5.5 5.8  HGB 10.2* 8.9*  HCT 32.5* 27.8*  MCV 93.1 91.4  PLT 180 244   Basic Metabolic Panel: Recent Labs  Lab 06/28/17 1956 06/29/17 0852  NA 138 140  K 4.1 3.7  CL 101 103  CO2 26 27  GLUCOSE 257* 92  BUN 34* 25*  CREATININE 0.99 0.74  CALCIUM 8.7* 8.4*  MG  --  1.5*  PHOS  --  3.6   GFR: Estimated Creatinine Clearance: 55.1 mL/min (by C-G formula based on SCr of 0.74 mg/dL). Liver Function Tests: Recent Labs  Lab 06/29/17 0852  AST 16  ALT 11*  ALKPHOS 50  BILITOT 0.8  PROT 5.7*  ALBUMIN 3.1*   No results for input(s): LIPASE, AMYLASE in the last 168 hours. No results for input(s): AMMONIA in the last 168 hours. Coagulation Profile: No results for input(s): INR, PROTIME in the last 168 hours. Cardiac Enzymes: No results for input(s): CKTOTAL, CKMB, CKMBINDEX, TROPONINI in the last 168 hours. BNP (last 3 results) No results for input(s): PROBNP in the last 8760 hours. HbA1C: No results for input(s): HGBA1C in the last 72 hours. CBG: Recent Labs  Lab 06/28/17 2003 06/29/17 0518 06/29/17 1106 06/29/17 1214  GLUCAP  225* 168* 101* 165*   Lipid Profile: No results for input(s): CHOL, HDL, LDLCALC, TRIG, CHOLHDL, LDLDIRECT in the last 72 hours. Thyroid Function Tests: No results for input(s): TSH, T4TOTAL, FREET4, T3FREE, THYROIDAB in the last 72 hours. Anemia Panel: No results for input(s): VITAMINB12, FOLATE, FERRITIN, TIBC, IRON, RETICCTPCT in the last 72 hours. Sepsis Labs: No results for input(s): PROCALCITON, LATICACIDVEN in the last 168 hours.  Recent Results (from the past 240 hour(s))  Surgical pcr screen     Status: None   Collection Time: 06/29/17  1:36 AM  Result Value Ref Range Status   MRSA, PCR NEGATIVE NEGATIVE Final   Staphylococcus aureus NEGATIVE NEGATIVE Final    Comment: (NOTE) The Xpert SA Assay (FDA approved for NASAL specimens in patients 3 years of age and older), is one component of a comprehensive surveillance program. It is not intended to diagnose infection nor to guide or monitor treatment.     Radiology Studies: Dg Elbow Complete Left (3+view)  Result Date: 06/28/2017 CLINICAL DATA:  Fall with left hip and elbow pain. Initial encounter. EXAM: LEFT ELBOW - COMPLETE 3+ VIEW COMPARISON:  None. FINDINGS: Acute fracture of the olecranon process of the proximal left ulna present which is displaced significantly. The olecranon fragment is rotated and posteriorly displaced by nearly 2 cm. Associated significant overlying soft tissue swelling and joint effusion present. No dislocation. No other fractures are identified. IMPRESSION: Acute fracture of the olecranon process of the proximal left ulna with significant displacement of the olecranon fragment. Electronically Signed   By: Aletta Edouard M.D.   On: 06/28/2017 19:26   Ct Head Wo Contrast  Result Date: 06/28/2017 CLINICAL DATA:  Status post.  Left-sided EXAM: CT HEAD WITHOUT CONTRAST TECHNIQUE: Contiguous axial images were obtained from the base of the skull through the vertex without intravenous contrast. COMPARISON:   None. FINDINGS: Brain: No evidence of acute infarction, hemorrhage, extra-axial collection, ventriculomegaly, or mass effect. Generalized cerebral atrophy. Periventricular white matter low attenuation likely secondary to microangiopathy. Vascular: Cerebrovascular atherosclerotic calcifications are noted. Skull: Negative for fracture or focal lesion. Sinuses/Orbits: Visualized portions of the orbits are unremarkable. Visualized portions of the paranasal sinuses and mastoid air cells are unremarkable. Other: None. IMPRESSION: No acute intracranial pathology. Electronically Signed   By: Kathreen Devoid   On: 06/28/2017 19:13   Dg Chest Port 1 View  Result Date: 06/28/2017 CLINICAL DATA:  Hip fracture. EXAM: PORTABLE CHEST 1 VIEW COMPARISON:  None. FINDINGS: Mild cardiomegaly is noted. No acute pulmonary disease is noted. No pneumothorax or pleural effusion is noted. Status post left shoulder arthroplasty. IMPRESSION: No acute cardiopulmonary  abnormality seen. Electronically Signed   By: Marijo Conception, M.D.   On: 06/28/2017 21:19   Dg Hip Unilat W Or Wo Pelvis 2-3 Views Left  Result Date: 06/28/2017 CLINICAL DATA:  Fall with left hip and elbow pain. Initial encounter. EXAM: DG HIP (WITH OR WITHOUT PELVIS) 2-3V LEFT COMPARISON:  None. FINDINGS: Bipolar arthroplasty of the left hip shows normal alignment. There is a probable nondisplaced fracture extending through the adjacent greater trochanter. The bony pelvis appears intact without fracture or diastasis. IMPRESSION: Suspect nondisplaced fracture of the left greater trochanter adjacent to an indwelling bipolar arthroplasty which shows normal alignment. Electronically Signed   By: Aletta Edouard M.D.   On: 06/28/2017 19:24   Scheduled Meds: . aspirin EC  81 mg Oral Daily  . escitalopram  10 mg Oral QHS  . insulin aspart  0-15 Units Subcutaneous Q4H  . multivitamin with minerals  1 tablet Oral Daily  . simvastatin  10 mg Oral q1800   Continuous  Infusions: . magnesium sulfate 1 - 4 g bolus IVPB      LOS: 1 day   Kerney Elbe, DO [facility] Pager [phone]  If 7PM-7AM, please contact night-coverage [email or web address] Password [facility] 06/29/2017, 12:35 PM

## 2017-06-29 NOTE — H&P (View-Only) (Signed)
Reason for Consult:Left elbow/hip fxs Referring Physician: [name] is an 81 y.o. female with DM, HTN, and hx/o breast ca. HPI: Melanie Hughes was outside yesterday when she stepped off the driveway and fell, landing on her left side. She had immediate elbow and hip pain and could not get up. She was brought to the ED where x-rays showed a left olecranon fx and left greater troch fx around an arthroplasty. Orthopedic surgery was consulted and recommended transfer to [facility]. She is RHD.  Past Medical History:  Diagnosis Date  . Cancer ([place])    breast  . Diabetes mellitus without complication ([place])   . High blood cholesterol   . Hypertension     Past Surgical History:  Procedure Laterality Date  . ABDOMINAL HYSTERECTOMY    . JOINT REPLACEMENT    . MASTECTOMY    . WRIST SURGERY      Family History  Problem Relation Age of Onset  . Obesity Grandchild     Social History:  reports that  has never smoked. she has never used smokeless tobacco. She reports that she does not drink alcohol or use drugs.  Allergies:  Allergies  Allergen Reactions  . Penicillins Itching    Medications: I have reviewed the patient's current medications.  Results for orders placed or performed during the hospital encounter of 06/28/17 (from the past 48 hour(s))  CBC with Differential/Platelet     Status: Abnormal   Collection Time: 06/28/17  7:56 PM  Result Value Ref Range   WBC 6.8 4.0 - 10.5 K/uL   RBC 3.49 (L) 3.87 - 5.11 MIL/uL   Hemoglobin 10.2 (L) 12.0 - 15.0 g/dL   HCT 32.5 (L) 36.0 - 46.0 %   MCV 93.1 78.0 - 100.0 fL   MCH 29.2 26.0 - 34.0 pg   MCHC 31.4 30.0 - 36.0 g/dL   RDW 13.7 11.5 - 15.5 %   Platelets 180 150 - 400 K/uL   Neutrophils Relative % 81 %   Neutro Abs 5.5 1.7 - 7.7 K/uL   Lymphocytes Relative 10 %   Lymphs Abs 0.7 0.7 - 4.0 K/uL   Monocytes Relative 7 %   Monocytes Absolute 0.5 0.1 - 1.0 K/uL   Eosinophils Relative 2 %   Eosinophils Absolute 0.1 0.0 - 0.7  K/uL   Basophils Relative 0 %   Basophils Absolute 0.0 0.0 - 0.1 K/uL  Basic metabolic panel     Status: Abnormal   Collection Time: 06/28/17  7:56 PM  Result Value Ref Range   Sodium 138 135 - 145 mmol/L   Potassium 4.1 3.5 - 5.1 mmol/L   Chloride 101 101 - 111 mmol/L   CO2 26 22 - 32 mmol/L   Glucose, Bld 257 (H) 65 - 99 mg/dL   BUN 34 (H) 6 - 20 mg/dL   Creatinine, Ser 0.99 0.44 - 1.00 mg/dL   Calcium 8.7 (L) 8.9 - 10.3 mg/dL   GFR calc non Af Amer 53 (L) >60 mL/min   GFR calc Af Amer >60 >60 mL/min    Comment: (NOTE) The eGFR has been calculated using the CKD EPI equation. This calculation has not been validated in all clinical situations. eGFR's persistently <60 mL/min signify possible Chronic Kidney Disease.    Anion gap 11 5 - 15  Type and screen     Status: None   Collection Time: 06/28/17  7:56 PM  Result Value Ref Range   ABO/RH(D) O POS  Antibody Screen NEG    Sample Expiration 07/01/2017   CBG monitoring, ED     Status: Abnormal   Collection Time: 06/28/17  8:03 PM  Result Value Ref Range   Glucose-Capillary 225 (H) 65 - 99 mg/dL   Comment 1 Notify RN    Comment 2 Document in Chart   Urinalysis, Routine w reflex microscopic     Status: Abnormal   Collection Time: 06/28/17  8:46 PM  Result Value Ref Range   Color, Urine YELLOW YELLOW   APPearance HAZY (A) CLEAR   Specific Gravity, Urine 1.020 1.005 - 1.030   pH 5.0 5.0 - 8.0   Glucose, UA >=500 (A) NEGATIVE mg/dL   Hgb urine dipstick NEGATIVE NEGATIVE   Bilirubin Urine NEGATIVE NEGATIVE   Ketones, ur NEGATIVE NEGATIVE mg/dL   Protein, ur NEGATIVE NEGATIVE mg/dL   Nitrite NEGATIVE NEGATIVE   Leukocytes, UA MODERATE (A) NEGATIVE   RBC / HPF 0-5 0 - 5 RBC/hpf   WBC, UA 6-30 0 - 5 WBC/hpf   Bacteria, UA RARE (A) NONE SEEN   Squamous Epithelial / LPF 0-5 (A) NONE SEEN  Surgical pcr screen     Status: None   Collection Time: 06/29/17  1:36 AM  Result Value Ref Range   MRSA, PCR NEGATIVE NEGATIVE    Staphylococcus aureus NEGATIVE NEGATIVE    Comment: (NOTE) The Xpert SA Assay (FDA approved for NASAL specimens in patients 13 years of age and older), is one component of a comprehensive surveillance program. It is not intended to diagnose infection nor to guide or monitor treatment.   Glucose, capillary     Status: Abnormal   Collection Time: 06/29/17  5:18 AM  Result Value Ref Range   Glucose-Capillary 168 (H) 65 - 99 mg/dL    Dg Elbow Complete Left (3+view)  Result Date: 06/28/2017 CLINICAL DATA:  Fall with left hip and elbow pain. Initial encounter. EXAM: LEFT ELBOW - COMPLETE 3+ VIEW COMPARISON:  None. FINDINGS: Acute fracture of the olecranon process of the proximal left ulna present which is displaced significantly. The olecranon fragment is rotated and posteriorly displaced by nearly 2 cm. Associated significant overlying soft tissue swelling and joint effusion present. No dislocation. No other fractures are identified. IMPRESSION: Acute fracture of the olecranon process of the proximal left ulna with significant displacement of the olecranon fragment. Electronically Signed   By: Aletta Edouard M.D.   On: 06/28/2017 19:26   Ct Head Wo Contrast  Result Date: 06/28/2017 CLINICAL DATA:  Status post.  Left-sided EXAM: CT HEAD WITHOUT CONTRAST TECHNIQUE: Contiguous axial images were obtained from the base of the skull through the vertex without intravenous contrast. COMPARISON:  None. FINDINGS: Brain: No evidence of acute infarction, hemorrhage, extra-axial collection, ventriculomegaly, or mass effect. Generalized cerebral atrophy. Periventricular white matter low attenuation likely secondary to microangiopathy. Vascular: Cerebrovascular atherosclerotic calcifications are noted. Skull: Negative for fracture or focal lesion. Sinuses/Orbits: Visualized portions of the orbits are unremarkable. Visualized portions of the paranasal sinuses and mastoid air cells are unremarkable. Other: None.  IMPRESSION: No acute intracranial pathology. Electronically Signed   By: Kathreen Devoid   On: 06/28/2017 19:13   Dg Chest Port 1 View  Result Date: 06/28/2017 CLINICAL DATA:  Hip fracture. EXAM: PORTABLE CHEST 1 VIEW COMPARISON:  None. FINDINGS: Mild cardiomegaly is noted. No acute pulmonary disease is noted. No pneumothorax or pleural effusion is noted. Status post left shoulder arthroplasty. IMPRESSION: No acute cardiopulmonary abnormality seen. Electronically Signed   By: Jeneen Rinks  Murlean Caller, M.D.   On: 06/28/2017 21:19   Dg Hip Unilat W Or Wo Pelvis 2-3 Views Left  Result Date: 06/28/2017 CLINICAL DATA:  Fall with left hip and elbow pain. Initial encounter. EXAM: DG HIP (WITH OR WITHOUT PELVIS) 2-3V LEFT COMPARISON:  None. FINDINGS: Bipolar arthroplasty of the left hip shows normal alignment. There is a probable nondisplaced fracture extending through the adjacent greater trochanter. The bony pelvis appears intact without fracture or diastasis. IMPRESSION: Suspect nondisplaced fracture of the left greater trochanter adjacent to an indwelling bipolar arthroplasty which shows normal alignment. Electronically Signed   By: Aletta Edouard M.D.   On: 06/28/2017 19:24    Review of Systems  Constitutional: Negative for weight loss.  HENT: Negative for ear discharge, ear pain, hearing loss and tinnitus.   Eyes: Negative for blurred vision, double vision, photophobia and pain.  Respiratory: Negative for cough, sputum production and shortness of breath.   Cardiovascular: Negative for chest pain.  Gastrointestinal: Negative for abdominal pain, nausea and vomiting.  Genitourinary: Negative for dysuria, flank pain, frequency and urgency.  Musculoskeletal: Positive for joint pain (Left elbow/hand and left hip/thigh). Negative for back pain, falls, myalgias and neck pain.  Neurological: Negative for dizziness, tingling, sensory change, focal weakness, loss of consciousness and headaches.  Endo/Heme/Allergies:  Does not bruise/bleed easily.  Psychiatric/Behavioral: Negative for depression, memory loss and substance abuse. The patient is not nervous/anxious.    Blood pressure 125/88, pulse 74, temperature 98.1 F (36.7 C), temperature source Oral, resp. rate 20, height _0  (1.702 m), weight 61.2 kg (135 lb), SpO2 99 %. Physical Exam  Constitutional: She appears well-developed and well-nourished. No distress.  HENT:  Head: Normocephalic.  Eyes: Conjunctivae are normal. Right eye exhibits no discharge. Left eye exhibits no discharge. No scleral icterus.  Neck: Normal range of motion.  Cardiovascular: Normal rate and regular rhythm.  Respiratory: Effort normal. No respiratory distress.  Musculoskeletal:  Left shoulder, elbow, wrist, digits- no skin wounds, TTP elbow, in sling  Sens  Ax/R/M/U intact  Mot   Ax/ R/ PIN/ M/ AIN/ U intact  Rad 2+  LLE No traumatic wounds, ecchymosis, or rash  TTP hip  No knee or ankle effusion  Knee stable to varus/ valgus and anterior/posterior stress  Sens DPN, SPN, TN intact  Motor EHL, ext, flex, evers 5/5  DP 2+, PT 2+, No significant edema  Neurological: She is alert.  Skin: Skin is warm and dry. She is not diaphoretic.  Psychiatric: She has a normal mood and affect. Her behavior is normal.    Assessment/Plan: Fall Left olecranon fx -- For ORIF Friday by Dr. Doreatha Martin. NPO after MN. NWB and sling. Left greater trochanter fx -- Can be WBAT with no active abduction Multiple medical problems -- per primary team    Lisette Abu, PA-C Orthopedic Surgery [phone] 06/29/2017, 9:20 AM

## 2017-06-29 NOTE — Progress Notes (Signed)
Orthopedic Tech Progress Note Patient Details:  Melanie Hughes 03/22/1936 [id number]  Ortho Devices Type of Ortho Device: Ace wrap, Long arm splint Ortho Device/Splint Location: LUE Ortho Device/Splint Interventions: Ordered, Application   Post Interventions Patient Tolerated: Well Instructions Provided: Care of device   Braulio Bosch 06/29/2017, 5:10 PM

## 2017-06-29 NOTE — Progress Notes (Signed)
Initial Nutrition Assessment  DOCUMENTATION CODES:   Not applicable  INTERVENTION:   Provide snacks BID  Encouraged adequate PO intake   NUTRITION DIAGNOSIS:   Increased nutrient needs related to hip fracture as evidenced by estimated needs.  GOAL:   Patient will meet greater than or equal to 90% of their needs  MONITOR:   PO intake, Supplement acceptance, Weight trends, I & O's  REASON FOR ASSESSMENT:   Consult Hip fracture protocol  ASSESSMENT:   Pt with PMH of DM, HTN, hx of breast cancer presents with closed left hip fracture and left olecranon fracture. Pt for ORIF Friday 12/28   Spoke with pt and her son at bedside.   Pt reports a fair to good appetite at home consuming 3 meals per day. Will typically consuming cereal with whole milk and toast with a spread for breakfast, corn-dog or sandwich for lunch, and her son often times brings her dinner consisting of meat and sides.   Pt consumed 100% of breakfast this morning consisting of eggs, bacon and toast.   Pt reports intentionally trying to lose weight for ~3 years stating she weighed 185 in 2015. No recent weight history available in chart.   Pt denies any nutritional supplements but is amenable to snacks during admission.   Labs reviewed; CBG 101-225, BUN 25, Magnesium 1.5, Albumin 3.1, Hemoglobin 8.9 Medication reviewed; sliding scale insulin, multivitamin, magnesium sulfate  NUTRITION - FOCUSED PHYSICAL EXAM:    Most Recent Value  Orbital Region  Mild depletion  Upper Arm Region  No depletion  Thoracic and Lumbar Region  No depletion  Buccal Region  No depletion  Temple Region  Mild depletion  Clavicle Bone Region  Moderate depletion  Clavicle and Acromion Bone Region  Moderate depletion  Scapular Bone Region  No depletion  Dorsal Hand  Moderate depletion  Patellar Region  No depletion  Anterior Thigh Region  No depletion  Posterior Calf Region  No depletion  Edema (RD Assessment)  Mild       Diet Order:  Diet Carb Modified Fluid consistency: Thin; Room service appropriate? Yes  EDUCATION NEEDS:   No education needs have been identified at this time  Skin:  Skin Assessment: Reviewed RN Assessment  Last BM:  Unknown BM date  Height:   Ht Readings from Last 1 Encounters:  06/28/17 5\' 7"  (1.702 m)    Weight:   Wt Readings from Last 1 Encounters:  06/28/17 135 lb (61.2 kg)    Ideal Body Weight:  61.4 kg  BMI:  Body mass index is 21.14 kg/m.  Estimated Nutritional Needs:    Kcal:  1550-1750  Protein:  75-85 grams  Fluid:  >/= 1.5 L/d  Parks Ranger, MS, RDN, LDN 06/29/2017 12:38 PM

## 2017-06-29 NOTE — Consult Note (Signed)
Reason for Consult:Left elbow/hip fxs Referring Physician: [name] is an 81 y.o. female with DM, HTN, and hx/o breast ca. HPI: Melanie Hughes was outside yesterday when she stepped off the driveway and fell, landing on her left side. She had immediate elbow and hip pain and could not get up. She was brought to the ED where x-rays showed a left olecranon fx and left greater troch fx around an arthroplasty. Orthopedic surgery was consulted and recommended transfer to [facility]. She is RHD.  Past Medical History:  Diagnosis Date  . Cancer ([place])    breast  . Diabetes mellitus without complication ([place])   . High blood cholesterol   . Hypertension     Past Surgical History:  Procedure Laterality Date  . ABDOMINAL HYSTERECTOMY    . JOINT REPLACEMENT    . MASTECTOMY    . WRIST SURGERY      Family History  Problem Relation Age of Onset  . Obesity Grandchild     Social History:  reports that  has never smoked. she has never used smokeless tobacco. She reports that she does not drink alcohol or use drugs.  Allergies:  Allergies  Allergen Reactions  . Penicillins Itching    Medications: I have reviewed the patient's current medications.  Results for orders placed or performed during the hospital encounter of 06/28/17 (from the past 48 hour(s))  CBC with Differential/Platelet     Status: Abnormal   Collection Time: 06/28/17  7:56 PM  Result Value Ref Range   WBC 6.8 4.0 - 10.5 K/uL   RBC 3.49 (L) 3.87 - 5.11 MIL/uL   Hemoglobin 10.2 (L) 12.0 - 15.0 g/dL   HCT 32.5 (L) 36.0 - 46.0 %   MCV 93.1 78.0 - 100.0 fL   MCH 29.2 26.0 - 34.0 pg   MCHC 31.4 30.0 - 36.0 g/dL   RDW 13.7 11.5 - 15.5 %   Platelets 180 150 - 400 K/uL   Neutrophils Relative % 81 %   Neutro Abs 5.5 1.7 - 7.7 K/uL   Lymphocytes Relative 10 %   Lymphs Abs 0.7 0.7 - 4.0 K/uL   Monocytes Relative 7 %   Monocytes Absolute 0.5 0.1 - 1.0 K/uL   Eosinophils Relative 2 %   Eosinophils Absolute 0.1 0.0 - 0.7  K/uL   Basophils Relative 0 %   Basophils Absolute 0.0 0.0 - 0.1 K/uL  Basic metabolic panel     Status: Abnormal   Collection Time: 06/28/17  7:56 PM  Result Value Ref Range   Sodium 138 135 - 145 mmol/L   Potassium 4.1 3.5 - 5.1 mmol/L   Chloride 101 101 - 111 mmol/L   CO2 26 22 - 32 mmol/L   Glucose, Bld 257 (H) 65 - 99 mg/dL   BUN 34 (H) 6 - 20 mg/dL   Creatinine, Ser 0.99 0.44 - 1.00 mg/dL   Calcium 8.7 (L) 8.9 - 10.3 mg/dL   GFR calc non Af Amer 53 (L) >60 mL/min   GFR calc Af Amer >60 >60 mL/min    Comment: (NOTE) The eGFR has been calculated using the CKD EPI equation. This calculation has not been validated in all clinical situations. eGFR's persistently <60 mL/min signify possible Chronic Kidney Disease.    Anion gap 11 5 - 15  Type and screen     Status: None   Collection Time: 06/28/17  7:56 PM  Result Value Ref Range   ABO/RH(D) O POS  Antibody Screen NEG    Sample Expiration 07/01/2017   CBG monitoring, ED     Status: Abnormal   Collection Time: 06/28/17  8:03 PM  Result Value Ref Range   Glucose-Capillary 225 (H) 65 - 99 mg/dL   Comment 1 Notify RN    Comment 2 Document in Chart   Urinalysis, Routine w reflex microscopic     Status: Abnormal   Collection Time: 06/28/17  8:46 PM  Result Value Ref Range   Color, Urine YELLOW YELLOW   APPearance HAZY (A) CLEAR   Specific Gravity, Urine 1.020 1.005 - 1.030   pH 5.0 5.0 - 8.0   Glucose, UA >=500 (A) NEGATIVE mg/dL   Hgb urine dipstick NEGATIVE NEGATIVE   Bilirubin Urine NEGATIVE NEGATIVE   Ketones, ur NEGATIVE NEGATIVE mg/dL   Protein, ur NEGATIVE NEGATIVE mg/dL   Nitrite NEGATIVE NEGATIVE   Leukocytes, UA MODERATE (A) NEGATIVE   RBC / HPF 0-5 0 - 5 RBC/hpf   WBC, UA 6-30 0 - 5 WBC/hpf   Bacteria, UA RARE (A) NONE SEEN   Squamous Epithelial / LPF 0-5 (A) NONE SEEN  Surgical pcr screen     Status: None   Collection Time: 06/29/17  1:36 AM  Result Value Ref Range   MRSA, PCR NEGATIVE NEGATIVE    Staphylococcus aureus NEGATIVE NEGATIVE    Comment: (NOTE) The Xpert SA Assay (FDA approved for NASAL specimens in patients 13 years of age and older), is one component of a comprehensive surveillance program. It is not intended to diagnose infection nor to guide or monitor treatment.   Glucose, capillary     Status: Abnormal   Collection Time: 06/29/17  5:18 AM  Result Value Ref Range   Glucose-Capillary 168 (H) 65 - 99 mg/dL    Dg Elbow Complete Left (3+view)  Result Date: 06/28/2017 CLINICAL DATA:  Fall with left hip and elbow pain. Initial encounter. EXAM: LEFT ELBOW - COMPLETE 3+ VIEW COMPARISON:  None. FINDINGS: Acute fracture of the olecranon process of the proximal left ulna present which is displaced significantly. The olecranon fragment is rotated and posteriorly displaced by nearly 2 cm. Associated significant overlying soft tissue swelling and joint effusion present. No dislocation. No other fractures are identified. IMPRESSION: Acute fracture of the olecranon process of the proximal left ulna with significant displacement of the olecranon fragment. Electronically Signed   By: Aletta Edouard M.D.   On: 06/28/2017 19:26   Ct Head Wo Contrast  Result Date: 06/28/2017 CLINICAL DATA:  Status post.  Left-sided EXAM: CT HEAD WITHOUT CONTRAST TECHNIQUE: Contiguous axial images were obtained from the base of the skull through the vertex without intravenous contrast. COMPARISON:  None. FINDINGS: Brain: No evidence of acute infarction, hemorrhage, extra-axial collection, ventriculomegaly, or mass effect. Generalized cerebral atrophy. Periventricular white matter low attenuation likely secondary to microangiopathy. Vascular: Cerebrovascular atherosclerotic calcifications are noted. Skull: Negative for fracture or focal lesion. Sinuses/Orbits: Visualized portions of the orbits are unremarkable. Visualized portions of the paranasal sinuses and mastoid air cells are unremarkable. Other: None.  IMPRESSION: No acute intracranial pathology. Electronically Signed   By: Kathreen Devoid   On: 06/28/2017 19:13   Dg Chest Port 1 View  Result Date: 06/28/2017 CLINICAL DATA:  Hip fracture. EXAM: PORTABLE CHEST 1 VIEW COMPARISON:  None. FINDINGS: Mild cardiomegaly is noted. No acute pulmonary disease is noted. No pneumothorax or pleural effusion is noted. Status post left shoulder arthroplasty. IMPRESSION: No acute cardiopulmonary abnormality seen. Electronically Signed   By: Jeneen Rinks  Murlean Caller, M.D.   On: 06/28/2017 21:19   Dg Hip Unilat W Or Wo Pelvis 2-3 Views Left  Result Date: 06/28/2017 CLINICAL DATA:  Fall with left hip and elbow pain. Initial encounter. EXAM: DG HIP (WITH OR WITHOUT PELVIS) 2-3V LEFT COMPARISON:  None. FINDINGS: Bipolar arthroplasty of the left hip shows normal alignment. There is a probable nondisplaced fracture extending through the adjacent greater trochanter. The bony pelvis appears intact without fracture or diastasis. IMPRESSION: Suspect nondisplaced fracture of the left greater trochanter adjacent to an indwelling bipolar arthroplasty which shows normal alignment. Electronically Signed   By: Aletta Edouard M.D.   On: 06/28/2017 19:24    Review of Systems  Constitutional: Negative for weight loss.  HENT: Negative for ear discharge, ear pain, hearing loss and tinnitus.   Eyes: Negative for blurred vision, double vision, photophobia and pain.  Respiratory: Negative for cough, sputum production and shortness of breath.   Cardiovascular: Negative for chest pain.  Gastrointestinal: Negative for abdominal pain, nausea and vomiting.  Genitourinary: Negative for dysuria, flank pain, frequency and urgency.  Musculoskeletal: Positive for joint pain (Left elbow/hand and left hip/thigh). Negative for back pain, falls, myalgias and neck pain.  Neurological: Negative for dizziness, tingling, sensory change, focal weakness, loss of consciousness and headaches.  Endo/Heme/Allergies:  Does not bruise/bleed easily.  Psychiatric/Behavioral: Negative for depression, memory loss and substance abuse. The patient is not nervous/anxious.    Blood pressure 125/88, pulse 74, temperature 98.1 F (36.7 C), temperature source Oral, resp. rate 20, height _0  (1.702 m), weight 61.2 kg (135 lb), SpO2 99 %. Physical Exam  Constitutional: She appears well-developed and well-nourished. No distress.  HENT:  Head: Normocephalic.  Eyes: Conjunctivae are normal. Right eye exhibits no discharge. Left eye exhibits no discharge. No scleral icterus.  Neck: Normal range of motion.  Cardiovascular: Normal rate and regular rhythm.  Respiratory: Effort normal. No respiratory distress.  Musculoskeletal:  Left shoulder, elbow, wrist, digits- no skin wounds, TTP elbow, in sling  Sens  Ax/R/M/U intact  Mot   Ax/ R/ PIN/ M/ AIN/ U intact  Rad 2+  LLE No traumatic wounds, ecchymosis, or rash  TTP hip  No knee or ankle effusion  Knee stable to varus/ valgus and anterior/posterior stress  Sens DPN, SPN, TN intact  Motor EHL, ext, flex, evers 5/5  DP 2+, PT 2+, No significant edema  Neurological: She is alert.  Skin: Skin is warm and dry. She is not diaphoretic.  Psychiatric: She has a normal mood and affect. Her behavior is normal.    Assessment/Plan: Fall Left olecranon fx -- For ORIF Friday by Dr. Doreatha Martin. NPO after MN. NWB and sling. Left greater trochanter fx -- Can be WBAT with no active abduction Multiple medical problems -- per primary team    Lisette Abu, PA-C Orthopedic Surgery [phone] 06/29/2017, 9:20 AM

## 2017-06-30 DIAGNOSIS — Z01818 Encounter for other preprocedural examination: Secondary | ICD-10-CM

## 2017-06-30 DIAGNOSIS — N179 Acute kidney failure, unspecified: Secondary | ICD-10-CM

## 2017-06-30 LAB — COMPREHENSIVE METABOLIC PANEL
ALBUMIN: 3.3 g/dL — AB (ref 3.5–5.0)
ALT: 11 U/L — ABNORMAL LOW (ref 14–54)
AST: 16 U/L (ref 15–41)
Alkaline Phosphatase: 56 U/L (ref 38–126)
Anion gap: 8 (ref 5–15)
BILIRUBIN TOTAL: 0.9 mg/dL (ref 0.3–1.2)
BUN: 30 mg/dL — AB (ref 6–20)
CO2: 26 mmol/L (ref 22–32)
Calcium: 8.5 mg/dL — ABNORMAL LOW (ref 8.9–10.3)
Chloride: 104 mmol/L (ref 101–111)
Creatinine, Ser: 1.03 mg/dL — ABNORMAL HIGH (ref 0.44–1.00)
GFR calc Af Amer: 58 mL/min — ABNORMAL LOW (ref >60)
GFR calc non Af Amer: 50 mL/min — ABNORMAL LOW (ref >60)
GLUCOSE: 156 mg/dL — AB (ref 65–99)
POTASSIUM: 3.7 mmol/L (ref 3.5–5.1)
Sodium: 138 mmol/L (ref 135–145)
TOTAL PROTEIN: 5.8 g/dL — AB (ref 6.5–8.1)

## 2017-06-30 LAB — CBC WITH DIFFERENTIAL/PLATELET
BASOS ABS: 0 10*3/uL (ref 0.0–0.1)
BASOS PCT: 0 %
Eosinophils Absolute: 0.1 10*3/uL (ref 0.0–0.7)
Eosinophils Relative: 1 %
HEMATOCRIT: 31.2 % — AB (ref 36.0–46.0)
HEMOGLOBIN: 9.9 g/dL — AB (ref 12.0–15.0)
Lymphocytes Relative: 7 %
Lymphs Abs: 0.6 10*3/uL — ABNORMAL LOW (ref 0.7–4.0)
MCH: 29 pg (ref 26.0–34.0)
MCHC: 31.7 g/dL (ref 30.0–36.0)
MCV: 91.5 fL (ref 78.0–100.0)
Monocytes Absolute: 0.8 10*3/uL (ref 0.1–1.0)
Monocytes Relative: 9 %
NEUTROS ABS: 7.4 10*3/uL (ref 1.7–7.7)
NEUTROS PCT: 83 %
Platelets: 171 10*3/uL (ref 150–400)
RBC: 3.41 MIL/uL — AB (ref 3.87–5.11)
RDW: 14.4 % (ref 11.5–15.5)
WBC: 8.9 10*3/uL (ref 4.0–10.5)

## 2017-06-30 LAB — PHOSPHORUS: Phosphorus: 3.7 mg/dL (ref 2.5–4.6)

## 2017-06-30 LAB — LIPID PANEL
CHOL/HDL RATIO: 2.1 ratio
CHOLESTEROL: 139 mg/dL (ref 0–200)
HDL: 67 mg/dL (ref >40)
LDL Cholesterol: 60 mg/dL (ref 0–99)
Triglycerides: 59 mg/dL (ref <150)
VLDL: 12 mg/dL (ref 0–40)

## 2017-06-30 LAB — GLUCOSE, CAPILLARY
GLUCOSE-CAPILLARY: 128 mg/dL — AB (ref 65–99)
GLUCOSE-CAPILLARY: 112 mg/dL — AB (ref 65–99)
GLUCOSE-CAPILLARY: 169 mg/dL — AB (ref 65–99)
GLUCOSE-CAPILLARY: 236 mg/dL — AB (ref 65–99)
Glucose-Capillary: 145 mg/dL — ABNORMAL HIGH (ref 65–99)

## 2017-06-30 LAB — HEMOGLOBIN A1C
Hgb A1c MFr Bld: 6.7 % — ABNORMAL HIGH (ref 4.8–5.6)
MEAN PLASMA GLUCOSE: 145.59 mg/dL

## 2017-06-30 LAB — MAGNESIUM: Magnesium: 2 mg/dL (ref 1.7–2.4)

## 2017-06-30 MED ORDER — CHLORHEXIDINE GLUCONATE 4 % EX LIQD
60.0000 mL | Freq: Once | CUTANEOUS | Status: AC
  Administered 2017-07-01: 4 via TOPICAL
  Filled 2017-06-30: qty 15

## 2017-06-30 MED ORDER — SODIUM CHLORIDE 0.9 % IV SOLN
INTRAVENOUS | Status: DC
Start: 2017-06-30 — End: 2017-07-01
  Administered 2017-06-30: 18:00:00 via INTRAVENOUS

## 2017-06-30 MED ORDER — CEFAZOLIN SODIUM-DEXTROSE 2-4 GM/100ML-% IV SOLN
2.0000 g | INTRAVENOUS | Status: AC
  Administered 2017-07-01: 2 g via INTRAVENOUS
  Filled 2017-06-30 (×2): qty 100

## 2017-06-30 MED ORDER — POVIDONE-IODINE 10 % EX SWAB: 2.0000 "application " | Freq: Once | CUTANEOUS | Status: DC

## 2017-06-30 NOTE — Progress Notes (Signed)
Physical Therapy Treatment Patient Details Name: Melanie Hughes MRN: [id number] DOB: 09-09-1935 Today's Date: 06/30/2017    History of Present Illness 81 year old female with fall and nondisplaced left greater trochanteric fracture around arthroplasty.  She also has a displaced left olecranon fracture, scheduled for ORIF 12/28. Pertinent PMH includes: DM, HTN, Breast CA, L TKA    PT Comments    Patient is s/p above surgery resulting in functional limitations due to the deficits listed below (see PT Problem List). PTA, pt was independent with all mobility and living alone in 1 story home with ramped entrance. Currently patient presents with pain and generalized weakness limiting mobility. Supervision level for bed mobility, mod A for OOB mobiliy and able to successfully transfer into bedside chair. Given presentation and hx of falling, Discussed short term SNF recommendations, patient and son on board. Patient will benefit from skilled PT to increase their independence and safety with mobility to allow discharge to the venue listed below.      Follow Up Recommendations  SNF;Supervision/Assistance - 24 hour     Equipment Recommendations  None recommended by PT    Recommendations for Other Services       Precautions / Restrictions Precautions Precautions: Fall Precaution Comments: hx of falls, discussed no LLE abduction ROM and NWB LUE Restrictions Weight Bearing Restrictions: Yes LUE Weight Bearing: Non weight bearing LLE Weight Bearing: Weight bearing as tolerated    Mobility  Bed Mobility Overal bed mobility: Needs Assistance Bed Mobility: Supine to Sit     Supine to sit: Supervision     General bed mobility comments: increased time and effort, able to supine to sit without physical assisatnce.Cues for hand placement with bed rail, HOB elevated.  Transfers Overall transfer level: Needs assistance Equipment used: Rolling walker (2 wheeled) Transfers: Sit to/from  [facility] Sit to Stand: Mod assist Stand pivot transfers: Min assist       General transfer comment: Mod A to assist power up into RW. cues for hand placement and WB status.   Ambulation/Gait             General Gait Details: unable to ambulate today.    Stairs            Wheelchair Mobility    Modified Rankin (Stroke Patients Only)       Balance Overall balance assessment: History of Falls                                          Cognition Arousal/Alertness: Awake/alert Behavior During Therapy: WFL for tasks assessed/performed Overall Cognitive Status: Within Functional Limits for tasks assessed                                        Exercises General Exercises - Lower Extremity Ankle Circles/Pumps: AROM;Both;20 reps Quad Sets: AROM;Both;10 reps Long Arc Quad: AROM;Both;10 reps    General Comments        Pertinent Vitals/Pain Pain Assessment: No/denies pain    Home Living Family/patient expects to be discharged to:: Private residence(has preference for [facility]) Living Arrangements: Alone Available Help at Discharge: Neighbor;Family(son and sister retired and avail for 24/7 support ) Type of Home: House Home Access: Stairs to enter;Ramped entrance Entrance Stairs-Rails: Can reach both Home Layout: One level Home Equipment:  Walker - 2 wheels;Tub bench;Grab bars - toilet;Grab bars - tub/shower      Prior Function Level of Independence: Independent      Comments: independent with ADLs   PT Goals (current goals can now be found in the care plan section) Acute Rehab PT Goals Patient Stated Goal: return home but open to short term SNF first. PT Goal Formulation: With patient Time For Goal Achievement: 07/07/17 Potential to Achieve Goals: Good    Frequency    Min 3X/week      PT Plan      Co-evaluation              AM-PAC PT "6 Clicks" Daily Activity  Outcome Measure   Difficulty turning over in bed (including adjusting bedclothes, sheets and blankets)?: A Little Difficulty moving from lying on back to sitting on the side of the bed? : A Little Difficulty sitting down on and standing up from a chair with arms (e.g., wheelchair, bedside commode, etc,.)?: Unable Help needed moving to and from a bed to chair (including a wheelchair)?: A Lot Help needed walking in hospital room?: A Lot Help needed climbing 3-5 steps with a railing? : Total 6 Click Score: 12    End of Session Equipment Utilized During Treatment: Gait belt Activity Tolerance: Patient limited by fatigue Patient left: in chair;with call bell/phone within reach;with family/visitor present Nurse Communication: Mobility status PT Visit Diagnosis: Unsteadiness on feet (R26.81);Other abnormalities of gait and mobility (R26.89);History of falling (Z91.81);Pain Pain - Right/Left: Left Pain - part of body: Arm;Hip     Time: 2876-8115 PT Time Calculation (min) (ACUTE ONLY): 39 min  Charges:  $Therapeutic Activity: 8-22 mins $Self Care/Home Management: 8-22                    G Codes:      Reinaldo Berber, PT, DPT Acute Rehab Services Pager: [phone]     Reinaldo Berber 06/30/2017, 11:03 AM

## 2017-06-30 NOTE — Anesthesia Preprocedure Evaluation (Addendum)
Anesthesia Evaluation  Patient identified by MRN, date of birth, ID band Patient awake    Reviewed: Allergy & Precautions, NPO status , Patient's Chart, lab work & pertinent test results  Airway Mallampati: III  TM Distance: >3 FB Neck ROM: Full    Dental  (+) Partial Lower   Pulmonary neg pulmonary ROS,    Pulmonary exam normal breath sounds clear to auscultation       Cardiovascular hypertension, Pt. on medications Normal cardiovascular exam Rhythm:Regular Rate:Normal  ECG: SR, rate 73   Neuro/Psych Anxiety negative neurological ROS     GI/Hepatic negative GI ROS, Neg liver ROS,   Endo/Other  diabetes, Poorly Controlled, Oral Hypoglycemic Agents  Renal/GU negative Renal ROS     Musculoskeletal negative musculoskeletal ROS (+)   Abdominal   Peds  Hematology  (+) anemia , HLD   Anesthesia Other Findings Motor and sensation intact to left hand  Reproductive/Obstetrics                            Anesthesia Physical Anesthesia Plan  ASA: II  Anesthesia Plan: General   Post-op Pain Management: GA combined w/ Regional for post-op pain   Induction: Intravenous  PONV Risk Score and Plan: 3 and Dexamethasone, Ondansetron and Treatment may vary due to age or medical condition  Airway Management Planned: Oral ETT  Additional Equipment:   Intra-op Plan:   Post-operative Plan: Extubation in OR  Informed Consent: I have reviewed the patients History and Physical, chart, labs and discussed the procedure including the risks, benefits and alternatives for the proposed anesthesia with the patient or authorized representative who has indicated his/her understanding and acceptance.   Dental advisory given  Plan Discussed with: CRNA  Anesthesia Plan Comments: (Possible post-op regional anesthesia discussed)       Anesthesia Quick Evaluation

## 2017-06-30 NOTE — Progress Notes (Signed)
PROGRESS NOTE    Melanie Hughes  ZOX:[id number] DOB: 1935-11-14 DOA: 06/28/2017 PCP: Alanda Amass, MD   Brief Narrative:  Melanie Hughes is a 81 y.o. female with medical history significant for Hypertension, Type 2 Diabetes Mellitus, History of Breast Cancer and other comorbids, now presenting to the emergency department with severe left hip and elbow pain after a ground-level mechanical fall.  She had been in her usual state of health and was having an uneventful day until she walked over an uneven surface, tripped, falling onto her left side, and experiencing immediate pain at the left elbow, hip, and periorbital region.  There was no loss of consciousness and she denies headache, vision change, nausea, vomiting, or focal numbness or weakness. She was brought to the ED  For evaluation and upon arrival to the ED, patient was found to be afebrile, saturating well on room air, and with stable blood pressure.  EKG featured a sinus rhythm, chest x-ray is negative for acute cardiopulmonary disease, and noncontrast head CT is negative for acute intracranial abnormality.    Radiographs of the left hip were suspicious for a nondisplaced fracture of the left greater trochanter adjacent to a bipolar density which remains intact.  Radiographs of the left elbow demonstrated an acute fracture of the left olecranon process with significant displacement of the olecranon fragment. Patient was treated with fentanyl in the ED and Orthopedic Surgery was consulted.  Orthopedic surgery recommended medical admission to [facility] and patient to undergo ORIF of Left Olecranon Fx on 07/01/17 by Dr. Doreatha Martin.  Assessment & Plan:   Principal Problem:   Closed left hip fracture, initial encounter [facility]) Active Problems:   Diabetes mellitus without complication ([place])   Hypertension   Closed fracture of left olecranon process   Anxiety   Normocytic anemia   Hx of breast cancer   Hypomagnesemia    Hyperlipidemia  Closed Left Greater Trochanter and Left Olecranon Fractures - Presented with severe pain in left hip and left elbow after a ground-level mechanical fall at home - Radiographs demonstrate fractures involving left olecranon process and left greater trochanter; Hhead CT negative  - She is unable to bear weight in ED  - Orthopedic surgery is consulting and appreciate additional Recc's; Patient to under go ORIF for Left Olecranon Fx on 07/01/17 and will need to be NPO after Midnight Thursday and NWB and Sling; Had Arm Splint last night  -Per Ortho Left Greater Trochanter Fx does not need intervention and recommends non-operative treatment and can be WBAT with no active Abduction - Pt typically enjoys good health, is active and independent, and ascends a flight of stairs without difficulty prior to injury - Based on available data, Ms. Vanriper presents an estimated 0.3% risk probability for perioperative MI or cardiac arrest per Melburn Hake al; this is low-risk and no further testing recommended prior to surgery   - Plan to keep NPO after midnight tonight  - Analgesia and Pain control with Hydro-codone-Acetaminophen 1-2 tab po q6hprn Moderate Pain and with IV Morphine 0.5-1 mg q2hprn Severe Pain -Bowel Regimen with Senna-Docusate 1 tab po qHSprn for mild Constipation and Bisacodyl EC tablet 5 mg po Dailyprn Moderate Constipation  -Defer VTE Prophylaxis to Orthopedics.  -PT/OT Recommending SNF; Social Work consulted for Placement   Type II DM  -No A1c on file; Checked HbA1c and was 6.7 -Managed at home with Glipizide, Metformin, and Actos, held on admission  -C/w Moderate Novolog Insulin SSI q4h -CBG's ranging from 112-169  Hypertension  -  BP at goal at 116/55 -[place] and HCTZ,  -Continue to use Hydralazine 10 mg IV q4hprn for SBP >165 or DBP >90  Depression and Anxiety  -Stable  -Continue Escitalopram 10 mg po qHS and Lorazepam 0.5 mg po q6hprn Anxiety/Sleep    Normocytic Anemia  - Hgb 10.2 on admission with no prior CBC on record  - No bleeding is evident; Continue to Monitor for S/Sx of Bleeding -Hb/Hct went from 10.2/32.5 -> 8.9/27.8 -> 9.9/31.2 -Type and Screen,  -Repeat CBC in am    Hx of Breast Cancer  -Status-post remote mastectomy and chemotherapy  -Continues follow-up with no recurrence   Hypomagnesemia -Patient's Mag Level was 1.5 and improved to 2.0 -Replete with IV Mag Sulfate 2 grams yesterday -Continue to Monitor and Replete as Necessary -Repeat Mag Level in AM  HLD -Lipid Panel showed Cholesterol of 139, HDL of 67, LDL of 60, TG of 59, VLDL of 12 -C/w Simvastatin 10 mg po Daily  Mild AKI -BUN/Cr went from 25/0.74 -> 30/1.03 -Restart Gentle IVF Rehydration with NS at 50 mL/hr -Avoid Nephrotoxic Medications  -Repeat CMP   DVT prophylaxis: SCDs Code Status: FULL CODE Family Communication: Discussed with son at bedside Disposition Plan: Remain Inpatient for ORIF on 07/01/17  Consultants:  Orthopedic Surgery Dr. Doreatha Martin  Procedures:  None   Antimicrobials:  Anti-infectives (From admission, onward)   Start     Dose/Rate Route Frequency Ordered Stop   07/01/17 0600  ceFAZolin (ANCEF) IVPB 2g/100 mL premix     2 g 200 mL/hr over 30 Minutes Intravenous On call to O.R. 06/30/17 1246 07/02/17 0559     Subjective: Seen and examined and patient was sitting in chair bedside. No complaints today. Denied any nausea or vomiting. Wanting SNF in [place].    Objective: Vitals:   06/29/17 0128 06/29/17 1300 06/29/17 1900 06/30/17 1531  BP: 125/88 (!) 108/56 102/60 (!) 116/55  Pulse: 74 70 70 75  Resp: 20 20 20 20   Temp: 98.1 F (36.7 C) 98.2 F (36.8 C) 98.1 F (36.7 C) 98.2 F (36.8 C)  TempSrc: Oral Oral Oral Oral  SpO2: 99% 99% 96% 97%  Weight:      Height:        Intake/Output Summary (Last 24 hours) at 06/30/2017 1725 Last data filed at 06/30/2017 0530 Gross per 24 hour  Intake -  Output 700 ml   Net -700 ml   Filed Weights   06/28/17 1825  Weight: 61.2 kg (135 lb)   Examination: Physical Exam:  Constitutional: WN/WD Caucasian female in NAD appears calm and comfortable sitting in chair near bedside Eyes: Sclerae anicteric. Has a bruise over Left Eye from fall ENMT: External Ears and nose appear normal Neck: Supple with no JVD Respiratory: Diminished to auscultation. No wheezing/rales/rhonchi.  Cardiovascular: RRR; No appreciable m/r/g. No extremity edema appreciated Abdomen: Soft, NT, ND. Bowel sounds present. GU: Deferred Musculoskeletal: Left Arm splinted and in Sling Skin: No rashes or lesions on a limited skin eval Neurologic: CN 2-12 grossly intact. No appreciable focal deficits; Sensation intact in all 4 extremities. Romberg Sign and Cerebellar reflexes not assessed Psychiatric: Normal mood and affect. Intact judgement and insight  Data Reviewed: I have personally reviewed following labs and imaging studies  CBC: Recent Labs  Lab 06/28/17 1956 06/29/17 0852 06/30/17 1010  WBC 6.8 7.3 8.9  NEUTROABS 5.5 5.8 7.4  HGB 10.2* 8.9* 9.9*  HCT 32.5* 27.8* 31.2*  MCV 93.1 91.4 91.5  PLT 180 156 171  Basic Metabolic Panel: Recent Labs  Lab 06/28/17 1956 06/29/17 0852 06/30/17 1010  NA 138 140 138  K 4.1 3.7 3.7  CL 101 103 104  CO2 26 27 26   GLUCOSE 257* 92 156*  BUN 34* 25* 30*  CREATININE 0.99 0.74 1.03*  CALCIUM 8.7* 8.4* 8.5*  MG  --  1.5* 2.0  PHOS  --  3.6 3.7   GFR: Estimated Creatinine Clearance: 42.8 mL/min (A) (by C-G formula based on SCr of 1.03 mg/dL (H)). Liver Function Tests: Recent Labs  Lab 06/29/17 0852 06/30/17 1010  AST 16 16  ALT 11* 11*  ALKPHOS 50 56  BILITOT 0.8 0.9  PROT 5.7* 5.8*  ALBUMIN 3.1* 3.3*   No results for input(s): LIPASE, AMYLASE in the last 168 hours. No results for input(s): AMMONIA in the last 168 hours. Coagulation Profile: No results for input(s): INR, PROTIME in the last 168 hours. Cardiac  Enzymes: No results for input(s): CKTOTAL, CKMB, CKMBINDEX, TROPONINI in the last 168 hours. BNP (last 3 results) No results for input(s): PROBNP in the last 8760 hours. HbA1C: Recent Labs    06/30/17 1010  HGBA1C 6.7*   CBG: Recent Labs  Lab 06/29/17 2338 06/30/17 0452 06/30/17 0908 06/30/17 1210 06/30/17 1640  GLUCAP 175* 128* 112* 145* 169*   Lipid Profile: Recent Labs    06/30/17 1010  CHOL 139  HDL 67  LDLCALC 60  TRIG 59  CHOLHDL 2.1   Thyroid Function Tests: No results for input(s): TSH, T4TOTAL, FREET4, T3FREE, THYROIDAB in the last 72 hours. Anemia Panel: No results for input(s): VITAMINB12, FOLATE, FERRITIN, TIBC, IRON, RETICCTPCT in the last 72 hours. Sepsis Labs: No results for input(s): PROCALCITON, LATICACIDVEN in the last 168 hours.  Recent Results (from the past 240 hour(s))  Surgical pcr screen     Status: None   Collection Time: 06/29/17  1:36 AM  Result Value Ref Range Status   MRSA, PCR NEGATIVE NEGATIVE Final   Staphylococcus aureus NEGATIVE NEGATIVE Final    Comment: (NOTE) The Xpert SA Assay (FDA approved for NASAL specimens in patients 88 years of age and older), is one component of a comprehensive surveillance program. It is not intended to diagnose infection nor to guide or monitor treatment.     Radiology Studies: Dg Elbow Complete Left (3+view)  Result Date: 06/28/2017 CLINICAL DATA:  Fall with left hip and elbow pain. Initial encounter. EXAM: LEFT ELBOW - COMPLETE 3+ VIEW COMPARISON:  None. FINDINGS: Acute fracture of the olecranon process of the proximal left ulna present which is displaced significantly. The olecranon fragment is rotated and posteriorly displaced by nearly 2 cm. Associated significant overlying soft tissue swelling and joint effusion present. No dislocation. No other fractures are identified. IMPRESSION: Acute fracture of the olecranon process of the proximal left ulna with significant displacement of the olecranon  fragment. Electronically Signed   By: Aletta Edouard M.D.   On: 06/28/2017 19:26   Ct Head Wo Contrast  Result Date: 06/28/2017 CLINICAL DATA:  Status post.  Left-sided EXAM: CT HEAD WITHOUT CONTRAST TECHNIQUE: Contiguous axial images were obtained from the base of the skull through the vertex without intravenous contrast. COMPARISON:  None. FINDINGS: Brain: No evidence of acute infarction, hemorrhage, extra-axial collection, ventriculomegaly, or mass effect. Generalized cerebral atrophy. Periventricular white matter low attenuation likely secondary to microangiopathy. Vascular: Cerebrovascular atherosclerotic calcifications are noted. Skull: Negative for fracture or focal lesion. Sinuses/Orbits: Visualized portions of the orbits are unremarkable. Visualized portions of the paranasal sinuses and  mastoid air cells are unremarkable. Other: None. IMPRESSION: No acute intracranial pathology. Electronically Signed   By: Kathreen Devoid   On: 06/28/2017 19:13   Dg Chest Port 1 View  Result Date: 06/28/2017 CLINICAL DATA:  Hip fracture. EXAM: PORTABLE CHEST 1 VIEW COMPARISON:  None. FINDINGS: Mild cardiomegaly is noted. No acute pulmonary disease is noted. No pneumothorax or pleural effusion is noted. Status post left shoulder arthroplasty. IMPRESSION: No acute cardiopulmonary abnormality seen. Electronically Signed   By: Marijo Conception, M.D.   On: 06/28/2017 21:19   Dg Hip Unilat W Or Wo Pelvis 2-3 Views Left  Result Date: 06/28/2017 CLINICAL DATA:  Fall with left hip and elbow pain. Initial encounter. EXAM: DG HIP (WITH OR WITHOUT PELVIS) 2-3V LEFT COMPARISON:  None. FINDINGS: Bipolar arthroplasty of the left hip shows normal alignment. There is a probable nondisplaced fracture extending through the adjacent greater trochanter. The bony pelvis appears intact without fracture or diastasis. IMPRESSION: Suspect nondisplaced fracture of the left greater trochanter adjacent to an indwelling bipolar arthroplasty  which shows normal alignment. Electronically Signed   By: Aletta Edouard M.D.   On: 06/28/2017 19:24   Scheduled Meds: . aspirin EC  81 mg Oral Daily  . [START ON 07/01/2017] chlorhexidine  60 mL Topical Once  . escitalopram  10 mg Oral QHS  . insulin aspart  0-15 Units Subcutaneous Q4H  . multivitamin with minerals  1 tablet Oral Daily  . [START ON 07/01/2017] povidone-iodine  2 application Topical Once  . simvastatin  10 mg Oral q1800   Continuous Infusions: . [START ON 07/01/2017]  ceFAZolin (ANCEF) IV      LOS: 2 days   Kerney Elbe, DO [facility] Pager [phone]  If 7PM-7AM, please contact night-coverage [email or web address] Password [email or web address] 06/30/2017, 5:25 PM

## 2017-06-30 NOTE — Evaluation (Signed)
Occupational Therapy Evaluation Patient Details Name: Melanie Hughes MRN: [id number] DOB: 08/15/1935 Today's Date: 06/30/2017    History of Present Illness 81 year old female with fall and nondisplaced left greater trochanteric fracture around arthroplasty.  She also has a displaced left olecranon fracture, scheduled for ORIF 12/28. Pertinent PMH includes: DM, HTN, Breast CA, L TKA   Clinical Impression   Pt with decline in function and safety with ADLs and ADL mobility. Pt would benefit from acute OT services to maximize level of function and safety    Follow Up Recommendations  SNF    Equipment Recommendations  None recommended by OT    Recommendations for Other Services       Precautions / Restrictions Precautions Precautions: Fall Precaution Comments: hx of falls, discussed no LLE abduction ROM and NWB LUE Restrictions Weight Bearing Restrictions: Yes LUE Weight Bearing: Non weight bearing LLE Weight Bearing: Weight bearing as tolerated      Mobility Bed Mobility               General bed mobility comments: pt up in recliner upon OT arrival  Transfers Overall transfer level: Needs assistance Equipment used: Rolling walker (2 wheeled) Transfers: Sit to/from [facility] Sit to Stand: Mod assist Stand pivot transfers: Min assist       General transfer comment: Mod A to assist power up into RW. cues for hand placement and WB status.     Balance Overall balance assessment: History of Falls                                         ADL either performed or assessed with clinical judgement   ADL Overall ADL's : Needs assistance/impaired Eating/Feeding: Set up;Sitting   Grooming: Sitting;Min guard   Upper Body Bathing: Moderate assistance   Lower Body Bathing: Moderate assistance   Upper Body Dressing : Moderate assistance   Lower Body Dressing: Moderate assistance   Toilet Transfer: Moderate assistance;Minimal  assistance;BSC;Stand-pivot;RW   Toileting- Clothing Manipulation and Hygiene: Total assistance       Functional mobility during ADLs: Moderate assistance;Minimal assistance;Rolling walker;Cueing for safety;Cueing for sequencing       Vision Baseline Vision/History: Wears glasses Wears Glasses: Reading only Patient Visual Report: No change from baseline       Perception     Praxis      Pertinent Vitals/Pain Pain Assessment: 0-10 Pain Score: 2  Pain Descriptors / Indicators: Sore Pain Intervention(s): Monitored during session;Repositioned     Hand Dominance Right   Extremity/Trunk Assessment Upper Extremity Assessment Upper Extremity Assessment: Generalized weakness;LUE deficits/detail LUE: Unable to fully assess due to immobilization   Lower Extremity Assessment Lower Extremity Assessment: Defer to PT evaluation       Communication Communication Communication: No difficulties   Cognition Arousal/Alertness: Awake/alert Behavior During Therapy: WFL for tasks assessed/performed Overall Cognitive Status: Within Functional Limits for tasks assessed                                     General Comments       Exercises     Shoulder Instructions      Home Living Family/patient expects to be discharged to:: Private residence Living Arrangements: Alone Available Help at Discharge: Neighbor;Family Type of Home: House Home Access: Stairs to enter;Ramped entrance   Entrance  Stairs-Rails: Can reach both Home Layout: One level     Bathroom Shower/Tub: Tub only   Biochemist, clinical: Standard Bathroom Accessibility: Yes   Home Equipment: Walker - 2 wheels;Tub bench;Grab bars - toilet;Grab bars - tub/shower          Prior Functioning/Environment Level of Independence: Independent        Comments: independent with ADLs        OT Problem List: Decreased strength;Decreased activity tolerance;Decreased knowledge of use of DME or AE;Impaired UE  functional use;Pain;Decreased coordination;Impaired balance (sitting and/or standing)      OT Treatment/Interventions: Self-care/ADL training;DME and/or AE instruction;Therapeutic activities;Therapeutic exercise;Patient/family education    OT Goals(Current goals can be found in the care plan section) Acute Rehab OT Goals Patient Stated Goal: return home but open to short term SNF first. OT Goal Formulation: With patient/family Time For Goal Achievement: 07/14/17 Potential to Achieve Goals: Good ADL Goals Pt Will Perform Grooming: with supervision;with set-up;sitting Pt Will Perform Upper Body Bathing: with min assist;sitting Pt Will Perform Lower Body Bathing: with min assist;sitting/lateral leans Pt Will Perform Upper Body Dressing: with min assist;sitting Pt Will Transfer to Toilet: with min assist;with min guard assist;bedside commode Pt Will Perform Toileting - Clothing Manipulation and hygiene: with mod assist  OT Frequency: Min 2X/week   Barriers to D/C: Decreased caregiver support          Co-evaluation              AM-PAC PT "6 Clicks" Daily Activity     Outcome Measure Help from another person eating meals?: None Help from another person taking care of personal grooming?: A Little Help from another person toileting, which includes using toliet, bedpan, or urinal?: A Lot Help from another person bathing (including washing, rinsing, drying)?: A Lot Help from another person to put on and taking off regular upper body clothing?: A Lot Help from another person to put on and taking off regular lower body clothing?: A Lot 6 Click Score: 15   End of Session Equipment Utilized During Treatment: Gait belt;Rolling walker;Other (comment)(BSC)  Activity Tolerance: Patient tolerated treatment well Patient left: in chair;with call bell/phone within reach  OT Visit Diagnosis: History of falling (Z91.81);Unsteadiness on feet (R26.81);Muscle weakness (generalized)  (M62.81);Pain Pain - Right/Left: Left Pain - part of body: Arm;Hip                Time: 2595-6387 OT Time Calculation (min): 32 min Charges:  OT General Charges $OT Visit: 1 Visit OT Evaluation $OT Eval Moderate Complexity: 1 Mod OT Treatments $Therapeutic Activity: 8-22 mins G-Codes: OT G-codes **NOT FOR INPATIENT CLASS** Functional Assessment Tool Used: AM-PAC 6 Clicks Daily Activity     Britt Bottom 06/30/2017, 2:33 PM

## 2017-06-30 NOTE — Progress Notes (Signed)
Orthopaedic Trauma Progress Note  S: Patient doing well pain well controlled.  Splint was placed to the left upper extremity yesterday.  O:  Vitals:   06/29/17 1300 06/29/17 1900  BP: (!) 108/56 102/60  Pulse: 70 70  Resp: 20 20  Temp: 98.2 F (36.8 C) 98.1 F (36.7 C)  SpO2: 99% 96%   Left upper extremity: Splint is in place clean dry and intact sling is in place as well.  She has intact motor and sensory function.  Compartments are soft and compressible.  Left lower extremity: Reveals skin without lesions.  Painless range of motion some discomfort in the hip.  Neurovascularly intact  Labs:  CBC    Component Value Date/Time   WBC 7.3 06/29/2017 0852   RBC 3.04 (L) 06/29/2017 0852   HGB 8.9 (L) 06/29/2017 0852   HCT 27.8 (L) 06/29/2017 0852   PLT 156 06/29/2017 0852   MCV 91.4 06/29/2017 0852   MCH 29.3 06/29/2017 0852   MCHC 32.0 06/29/2017 0852   RDW 14.3 06/29/2017 0852   LYMPHSABS 0.7 06/29/2017 0852   MONOABS 0.7 06/29/2017 0852   EOSABS 0.0 06/29/2017 0852   BASOSABS 0.0 06/29/2017 08539    A/P: 81 year old female status post fall with nondisplaced left periprosthetic greater trochanter fracture and left olecranon fracture  -Weightbearing as tolerated to left lower extremity with no active abduction, nonweightbearing left upper extremity -Plan for surgery tomorrow morning -N.p.o. after midnight, consent for olecranon ORIF -We will likely need placement after hospitalization. -PT OT today  Shona Needles, MD [facility] [phone] (phone)

## 2017-07-01 ENCOUNTER — Encounter (HOSPITAL_COMMUNITY)
Admission: EM | Disposition: A | Discharge status: Skilled Nursing Facility | Payer: Self-pay | Source: Home / Self Care | Attending: Internal Medicine

## 2017-07-01 ENCOUNTER — Inpatient Hospital Stay (HOSPITAL_COMMUNITY): Payer: Medicare Other | Admitting: Anesthesiology

## 2017-07-01 ENCOUNTER — Inpatient Hospital Stay (HOSPITAL_COMMUNITY): Payer: Medicare Other

## 2017-07-01 ENCOUNTER — Encounter (HOSPITAL_COMMUNITY): Payer: Self-pay | Admitting: Student

## 2017-07-01 DIAGNOSIS — T148XXA Other injury of unspecified body region, initial encounter: Secondary | ICD-10-CM

## 2017-07-01 DIAGNOSIS — Z419 Encounter for procedure for purposes other than remedying health state, unspecified: Secondary | ICD-10-CM

## 2017-07-01 HISTORY — PX: ORIF ELBOW FRACTURE: SHX5031

## 2017-07-01 LAB — CBC WITH DIFFERENTIAL/PLATELET
Basophils Absolute: 0 10*3/uL (ref 0.0–0.1)
Basophils Relative: 0 %
EOS PCT: 3 %
Eosinophils Absolute: 0.2 10*3/uL (ref 0.0–0.7)
HEMATOCRIT: 28.8 % — AB (ref 36.0–46.0)
Hemoglobin: 9.1 g/dL — ABNORMAL LOW (ref 12.0–15.0)
LYMPHS ABS: 1 10*3/uL (ref 0.7–4.0)
Lymphocytes Relative: 15 %
MCH: 28.9 pg (ref 26.0–34.0)
MCHC: 31.6 g/dL (ref 30.0–36.0)
MCV: 91.4 fL (ref 78.0–100.0)
MONO ABS: 0.5 10*3/uL (ref 0.1–1.0)
MONOS PCT: 8 %
NEUTROS ABS: 5 10*3/uL (ref 1.7–7.7)
Neutrophils Relative %: 74 %
PLATELETS: 151 10*3/uL (ref 150–400)
RBC: 3.15 MIL/uL — ABNORMAL LOW (ref 3.87–5.11)
RDW: 14.4 % (ref 11.5–15.5)
WBC: 6.7 10*3/uL (ref 4.0–10.5)

## 2017-07-01 LAB — COMPREHENSIVE METABOLIC PANEL
ALT: 11 U/L — ABNORMAL LOW (ref 14–54)
ANION GAP: 9 (ref 5–15)
AST: 21 U/L (ref 15–41)
Albumin: 3.1 g/dL — ABNORMAL LOW (ref 3.5–5.0)
Alkaline Phosphatase: 50 U/L (ref 38–126)
BILIRUBIN TOTAL: 0.8 mg/dL (ref 0.3–1.2)
BUN: 31 mg/dL — AB (ref 6–20)
CO2: 25 mmol/L (ref 22–32)
Calcium: 8.6 mg/dL — ABNORMAL LOW (ref 8.9–10.3)
Chloride: 104 mmol/L (ref 101–111)
Creatinine, Ser: 0.93 mg/dL (ref 0.44–1.00)
GFR, EST NON AFRICAN AMERICAN: 57 mL/min — AB (ref >60)
Glucose, Bld: 158 mg/dL — ABNORMAL HIGH (ref 65–99)
POTASSIUM: 4.2 mmol/L (ref 3.5–5.1)
Sodium: 138 mmol/L (ref 135–145)
TOTAL PROTEIN: 5.6 g/dL — AB (ref 6.5–8.1)

## 2017-07-01 LAB — PHOSPHORUS: PHOSPHORUS: 3.6 mg/dL (ref 2.5–4.6)

## 2017-07-01 LAB — GLUCOSE, CAPILLARY
GLUCOSE-CAPILLARY: 63 mg/dL — AB (ref 65–99)
GLUCOSE-CAPILLARY: 143 mg/dL — AB (ref 65–99)
GLUCOSE-CAPILLARY: 167 mg/dL — AB (ref 65–99)
GLUCOSE-CAPILLARY: 267 mg/dL — AB (ref 65–99)
Glucose-Capillary: 137 mg/dL — ABNORMAL HIGH (ref 65–99)
Glucose-Capillary: 246 mg/dL — ABNORMAL HIGH (ref 65–99)

## 2017-07-01 LAB — MAGNESIUM: MAGNESIUM: 1.9 mg/dL (ref 1.7–2.4)

## 2017-07-01 SURGERY — OPEN REDUCTION INTERNAL FIXATION (ORIF) ELBOW/OLECRANON FRACTURE
Anesthesia: General | Site: Elbow | Laterality: Left

## 2017-07-01 MED ORDER — DEXAMETHASONE SODIUM PHOSPHATE 10 MG/ML IJ SOLN
INTRAMUSCULAR | Status: DC | PRN
  Administered 2017-07-01: 5 mg via INTRAVENOUS

## 2017-07-01 MED ORDER — GUAIFENESIN ER 600 MG PO TB12
1200.0000 mg | ORAL_TABLET | Freq: Two times a day (BID) | ORAL | Status: DC
  Administered 2017-07-01 – 2017-07-04 (×7): 1200 mg via ORAL
  Filled 2017-07-01 (×7): qty 2

## 2017-07-01 MED ORDER — VANCOMYCIN HCL 1000 MG IV SOLR
INTRAVENOUS | Status: DC | PRN
  Administered 2017-07-01: 1000 mg via TOPICAL

## 2017-07-01 MED ORDER — ONDANSETRON HCL 4 MG/2ML IJ SOLN: 4.0000 mg | Freq: Once | INTRAMUSCULAR | Status: DC | PRN

## 2017-07-01 MED ORDER — CHLORHEXIDINE GLUCONATE 4 % EX LIQD
CUTANEOUS | Status: AC
  Administered 2017-07-01: 4 via TOPICAL
  Filled 2017-07-01: qty 45

## 2017-07-01 MED ORDER — FENTANYL CITRATE (PF) 250 MCG/5ML IJ SOLN
INTRAMUSCULAR | Status: DC | PRN
  Administered 2017-07-01: 50 ug via INTRAVENOUS

## 2017-07-01 MED ORDER — FENTANYL CITRATE (PF) 250 MCG/5ML IJ SOLN
INTRAMUSCULAR | Status: AC
  Filled 2017-07-01: qty 5

## 2017-07-01 MED ORDER — ONDANSETRON HCL 4 MG/2ML IJ SOLN
INTRAMUSCULAR | Status: DC | PRN
  Administered 2017-07-01: 4 mg via INTRAVENOUS

## 2017-07-01 MED ORDER — PHENYLEPHRINE 40 MCG/ML (10ML) SYRINGE FOR IV PUSH (FOR BLOOD PRESSURE SUPPORT)
PREFILLED_SYRINGE | INTRAVENOUS | Status: AC
  Filled 2017-07-01: qty 10

## 2017-07-01 MED ORDER — CEFAZOLIN SODIUM-DEXTROSE 2-4 GM/100ML-% IV SOLN
2.0000 g | Freq: Three times a day (TID) | INTRAVENOUS | Status: AC
  Administered 2017-07-01 – 2017-07-02 (×3): 2 g via INTRAVENOUS
  Filled 2017-07-01 (×3): qty 100

## 2017-07-01 MED ORDER — ROCURONIUM BROMIDE 10 MG/ML (PF) SYRINGE
PREFILLED_SYRINGE | INTRAVENOUS | Status: DC | PRN
  Administered 2017-07-01: 20 mg via INTRAVENOUS
  Administered 2017-07-01: 50 mg via INTRAVENOUS

## 2017-07-01 MED ORDER — SUGAMMADEX SODIUM 200 MG/2ML IV SOLN
INTRAVENOUS | Status: DC | PRN
  Administered 2017-07-01: 122.4 mg via INTRAVENOUS

## 2017-07-01 MED ORDER — DEXAMETHASONE SODIUM PHOSPHATE 10 MG/ML IJ SOLN
INTRAMUSCULAR | Status: AC
  Filled 2017-07-01: qty 1

## 2017-07-01 MED ORDER — MIDAZOLAM HCL 2 MG/2ML IJ SOLN
INTRAMUSCULAR | Status: AC
  Filled 2017-07-01: qty 2

## 2017-07-01 MED ORDER — LIDOCAINE 2% (20 MG/ML) 5 ML SYRINGE
INTRAMUSCULAR | Status: DC | PRN
  Administered 2017-07-01: 60 mg via INTRAVENOUS

## 2017-07-01 MED ORDER — FENTANYL CITRATE (PF) 100 MCG/2ML IJ SOLN
INTRAMUSCULAR | Status: AC
  Administered 2017-07-01: 50 ug via INTRAVENOUS
  Filled 2017-07-01: qty 2

## 2017-07-01 MED ORDER — PHENOL 1.4 % MT LIQD
1.0000 | OROMUCOSAL | Status: DC | PRN
  Administered 2017-07-01: 1 via OROMUCOSAL

## 2017-07-01 MED ORDER — PROPOFOL 10 MG/ML IV BOLUS
INTRAVENOUS | Status: AC
  Filled 2017-07-01: qty 20

## 2017-07-01 MED ORDER — ENOXAPARIN SODIUM 40 MG/0.4ML ~~LOC~~ SOLN: 40.0000 mg | SUBCUTANEOUS | Status: DC

## 2017-07-01 MED ORDER — LACTATED RINGERS IV SOLN
INTRAVENOUS | Status: DC | PRN
  Administered 2017-07-01 (×2): via INTRAVENOUS

## 2017-07-01 MED ORDER — VANCOMYCIN HCL 1000 MG IV SOLR
INTRAVENOUS | Status: AC
  Filled 2017-07-01: qty 1000

## 2017-07-01 MED ORDER — ENOXAPARIN SODIUM 40 MG/0.4ML ~~LOC~~ SOLN
40.0000 mg | SUBCUTANEOUS | Status: DC
  Administered 2017-07-01 – 2017-07-03 (×3): 40 mg via SUBCUTANEOUS
  Filled 2017-07-01 (×3): qty 0.4

## 2017-07-01 MED ORDER — PROPOFOL 10 MG/ML IV BOLUS
INTRAVENOUS | Status: DC | PRN
  Administered 2017-07-01: 80 mg via INTRAVENOUS
  Administered 2017-07-01: 50 mg via INTRAVENOUS

## 2017-07-01 MED ORDER — 0.9 % SODIUM CHLORIDE (POUR BTL) OPTIME
TOPICAL | Status: DC | PRN
  Administered 2017-07-01: 1000 mL

## 2017-07-01 MED ORDER — ONDANSETRON HCL 4 MG/2ML IJ SOLN
INTRAMUSCULAR | Status: AC
  Filled 2017-07-01: qty 2

## 2017-07-01 MED ORDER — LIDOCAINE 2% (20 MG/ML) 5 ML SYRINGE
INTRAMUSCULAR | Status: AC
  Filled 2017-07-01: qty 5

## 2017-07-01 MED ORDER — ROCURONIUM BROMIDE 10 MG/ML (PF) SYRINGE
PREFILLED_SYRINGE | INTRAVENOUS | Status: AC
  Filled 2017-07-01: qty 5

## 2017-07-01 MED ORDER — PHENYLEPHRINE 40 MCG/ML (10ML) SYRINGE FOR IV PUSH (FOR BLOOD PRESSURE SUPPORT)
PREFILLED_SYRINGE | INTRAVENOUS | Status: DC | PRN
  Administered 2017-07-01: 40 ug via INTRAVENOUS
  Administered 2017-07-01: 80 ug via INTRAVENOUS
  Administered 2017-07-01: 120 ug via INTRAVENOUS

## 2017-07-01 MED ORDER — FENTANYL CITRATE (PF) 100 MCG/2ML IJ SOLN
25.0000 ug | INTRAMUSCULAR | Status: DC | PRN
  Administered 2017-07-01: 50 ug via INTRAVENOUS
  Administered 2017-07-01: 25 ug via INTRAVENOUS

## 2017-07-01 SURGICAL SUPPLY — 87 items
BANDAGE ACE 3X5.8 VEL STRL LF (GAUZE/BANDAGES/DRESSINGS) ×3 IMPLANT
BANDAGE ACE 4X5 VEL STRL LF (GAUZE/BANDAGES/DRESSINGS) ×3 IMPLANT
BANDAGE ACE 6X5 VEL STRL LF (GAUZE/BANDAGES/DRESSINGS) ×3 IMPLANT
BENZOIN TINCTURE PRP APPL 2/3 (GAUZE/BANDAGES/DRESSINGS) IMPLANT
BIT DRILL 2.5X110 QC LCP DISP (BIT) ×3 IMPLANT
BIT DRILL LCP QC 2X140 (BIT) ×3 IMPLANT
BIT DRILL PERC QC 2.8X200 100 (BIT) ×1 IMPLANT
BLADE AVERAGE 25MMX9MM (BLADE) ×1
BLADE AVERAGE 25X9 (BLADE) ×2 IMPLANT
BLADE CLIPPER SURG (BLADE) IMPLANT
BLADE SURG 10 STRL SS (BLADE) ×3 IMPLANT
BNDG COHESIVE 4X5 TAN STRL (GAUZE/BANDAGES/DRESSINGS) ×3 IMPLANT
BNDG ESMARK 4X9 LF (GAUZE/BANDAGES/DRESSINGS) ×3 IMPLANT
BNDG GAUZE ELAST 4 BULKY (GAUZE/BANDAGES/DRESSINGS) ×3 IMPLANT
BRUSH SCRUB SURG 4.25 DISP (MISCELLANEOUS) ×6 IMPLANT
CHLORAPREP W/TINT 26ML (MISCELLANEOUS) ×3 IMPLANT
CLEANER TIP ELECTROSURG 2X2 (MISCELLANEOUS) ×3 IMPLANT
CLOSURE WOUND 1/2 X4 (GAUZE/BANDAGES/DRESSINGS)
CONNECTOR 5 IN 1 STRAIGHT STRL (MISCELLANEOUS) ×3 IMPLANT
COVER SURGICAL LIGHT HANDLE (MISCELLANEOUS) ×6 IMPLANT
CUFF TOURNIQUET SINGLE 18IN (TOURNIQUET CUFF) IMPLANT
CUFF TOURNIQUET SINGLE 24IN (TOURNIQUET CUFF) IMPLANT
DECANTER SPIKE VIAL GLASS SM (MISCELLANEOUS) IMPLANT
DRAPE C-ARM 42X72 X-RAY (DRAPES) IMPLANT
DRAPE C-ARMOR (DRAPES) ×3 IMPLANT
DRAPE INCISE IOBAN 66X45 STRL (DRAPES) ×3 IMPLANT
DRAPE ORTHO SPLIT 87X125 STRL (DRAPES) ×3 IMPLANT
DRAPE U-SHAPE 47X51 STRL (DRAPES) ×3 IMPLANT
DRILL BIT QUICK COUP 2.8MM 100 (BIT) ×2
DRSG ADAPTIC 3X8 NADH LF (GAUZE/BANDAGES/DRESSINGS) IMPLANT
DRSG EMULSION OIL 3X3 NADH (GAUZE/BANDAGES/DRESSINGS) IMPLANT
DRSG MEPILEX BORDER 4X8 (GAUZE/BANDAGES/DRESSINGS) ×3 IMPLANT
ELECT REM PT RETURN 9FT ADLT (ELECTROSURGICAL) ×3
ELECTRODE REM PT RTRN 9FT ADLT (ELECTROSURGICAL) ×1 IMPLANT
FACESHIELD WRAPAROUND (MASK) IMPLANT
GAUZE SPONGE 4X4 12PLY STRL (GAUZE/BANDAGES/DRESSINGS) ×3 IMPLANT
GAUZE XEROFORM 1X8 LF (GAUZE/BANDAGES/DRESSINGS) ×3 IMPLANT
GAUZE XEROFORM 5X9 LF (GAUZE/BANDAGES/DRESSINGS) ×3 IMPLANT
GLOVE BIO SURGEON STRL SZ7.5 (GLOVE) ×12 IMPLANT
GLOVE BIOGEL PI IND STRL 7.5 (GLOVE) ×1 IMPLANT
GLOVE BIOGEL PI INDICATOR 7.5 (GLOVE) ×2
GOWN STRL REUS W/ TWL LRG LVL3 (GOWN DISPOSABLE) ×2 IMPLANT
GOWN STRL REUS W/TWL LRG LVL3 (GOWN DISPOSABLE) ×4
KIT BASIN OR (CUSTOM PROCEDURE TRAY) ×3 IMPLANT
KIT ROOM TURNOVER OR (KITS) ×3 IMPLANT
MANIFOLD NEPTUNE II (INSTRUMENTS) ×3 IMPLANT
NS IRRIG 1000ML POUR BTL (IV SOLUTION) ×3 IMPLANT
PACK ORTHO EXTREMITY (CUSTOM PROCEDURE TRAY) ×3 IMPLANT
PAD ARMBOARD 7.5X6 YLW CONV (MISCELLANEOUS) ×6 IMPLANT
PAD CAST 3X4 CTTN HI CHSV (CAST SUPPLIES) ×1 IMPLANT
PAD CAST 4YDX4 CTTN HI CHSV (CAST SUPPLIES) ×1 IMPLANT
PADDING CAST COTTON 3X4 STRL (CAST SUPPLIES) ×2
PADDING CAST COTTON 4X4 STRL (CAST SUPPLIES) ×2
PLATE ELBOW LT PROX 2.7/3.5 2H (Plate) ×3 IMPLANT
SCREW CORTEX 3.5 20MM (Screw) ×2 IMPLANT
SCREW LOCK CORT ST 3.5X20 (Screw) ×1 IMPLANT
SCREW LOCK T15 FT 22X3.5XST (Screw) ×1 IMPLANT
SCREW LOCK T8 24X2.7XSTVA (Screw) ×1 IMPLANT
SCREW LOCK VA ST 2.7X20 (Screw) ×6 IMPLANT
SCREW LOCKING 2.7X24MM (Screw) ×2 IMPLANT
SCREW LOCKING 2.7X44MM VA (Screw) ×3 IMPLANT
SCREW LOCKING 2.7X60MM (Screw) ×3 IMPLANT
SCREW LOCKING 3.5X22 (Screw) ×2 IMPLANT
SCREW LOCKING VA 2.7X50MM (Screw) ×3 IMPLANT
SPONGE LAP 18X18 X RAY DECT (DISPOSABLE) ×6 IMPLANT
STAPLER VISISTAT 35W (STAPLE) ×3 IMPLANT
STRIP CLOSURE SKIN 1/2X4 (GAUZE/BANDAGES/DRESSINGS) IMPLANT
SUCTION FRAZIER HANDLE 10FR (MISCELLANEOUS) ×2
SUCTION TUBE FRAZIER 10FR DISP (MISCELLANEOUS) ×1 IMPLANT
SUT MNCRL AB 3-0 PS2 18 (SUTURE) ×3 IMPLANT
SUT MON AB 2-0 CT1 36 (SUTURE) ×3 IMPLANT
SUT PDS AB 2-0 CT1 27 (SUTURE) IMPLANT
SUT PROLENE 0 CT (SUTURE) IMPLANT
SUT PROLENE 3 0 PS 2 (SUTURE) ×6 IMPLANT
SUT VIC AB 0 CT1 27 (SUTURE) ×4
SUT VIC AB 0 CT1 27XBRD ANBCTR (SUTURE) ×2 IMPLANT
SUT VIC AB 2-0 CT1 27 (SUTURE) ×4
SUT VIC AB 2-0 CT1 TAPERPNT 27 (SUTURE) ×2 IMPLANT
SUT VIC AB 2-0 CT3 27 (SUTURE) IMPLANT
SYR CONTROL 10ML LL (SYRINGE) ×3 IMPLANT
TOWEL OR 17X24 6PK STRL BLUE (TOWEL DISPOSABLE) IMPLANT
TOWEL OR 17X26 10 PK STRL BLUE (TOWEL DISPOSABLE) ×6 IMPLANT
TUBE CONNECTING 12'X1/4 (SUCTIONS) ×2
TUBE CONNECTING 12X1/4 (SUCTIONS) ×4 IMPLANT
UNDERPAD 30X30 (UNDERPADS AND DIAPERS) ×3 IMPLANT
WATER STERILE IRR 1000ML POUR (IV SOLUTION) ×3 IMPLANT
YANKAUER SUCT BULB TIP NO VENT (SUCTIONS) ×3 IMPLANT

## 2017-07-01 NOTE — Progress Notes (Signed)
PROGRESS NOTE    Melanie Hughes  EXB:[id number] DOB: [birth year] DOA: 06/28/2017 PCP: Melanie Amass, MD   Brief Narrative:  Melanie Hughes is a 81 y.o. female with medical history significant for Hypertension, Type 2 Diabetes Mellitus, History of Breast Cancer and other comorbids, now presenting to the emergency department with severe left hip and elbow pain after a ground-level mechanical fall.  She had been in her usual state of health and was having an uneventful day until she walked over an uneven surface, tripped, falling onto her left side, and experiencing immediate pain at the left elbow, hip, and periorbital region.  There was no loss of consciousness and she denies headache, vision change, nausea, vomiting, or focal numbness or weakness. She was brought to the ED  For evaluation and upon arrival to the ED, patient was found to be afebrile, saturating well on room air, and with stable blood pressure.  EKG featured a sinus rhythm, chest x-ray is negative for acute cardiopulmonary disease, and noncontrast head CT is negative for acute intracranial abnormality.    Radiographs of the left hip were suspicious for a nondisplaced fracture of the left greater trochanter adjacent to a bipolar density which remains intact.  Radiographs of the left elbow demonstrated an acute fracture of the left olecranon process with significant displacement of the olecranon fragment. Patient was treated with fentanyl in the ED and Orthopedic Surgery was consulted.  Orthopedic surgery recommended medical admission to [facility] and patient underwent ORIF of Left Olecranon Fx on 07/01/17 by Dr. Doreatha Hughes and is POD 0 and doing well.   Assessment & Plan:   Principal Problem:   Closed left hip fracture, initial encounter [facility]) Active Problems:   Diabetes mellitus without complication ([place])   Hypertension   Closed fracture of left olecranon process   Anxiety   Normocytic anemia   Hx of breast cancer  Hypomagnesemia   Hyperlipidemia  Closed Left Greater Trochanter Left Olecranon Fractures s/p ORIF POD1 - Presented with severe pain in left hip and left elbow after a ground-level mechanical fall at home - Radiographs demonstrate fractures involving left olecranon process and left greater trochanter; Hhead CT negative  - She was unable to bear weight in ED  - Orthopedic surgery is consulting and appreciate additional Recc's; Patient underwent ORIF for Left Olecranon Fx on 07/01/17 and is POD 0 -Per Ortho Left Greater Trochanter Fx does not need intervention and recommends non-operative treatment and can be WBAT with no active Abduction currently; Discussed with Orthopedics and ok to Abduct in 1 month  - Pt typically enjoys good health, is active and independent, and ascends a flight of stairs without difficulty prior to injury - Based on available data, Melanie Hughes presents an estimated 0.3% risk probability for perioperative MI or cardiac arrest per Melanie Hughes al; this is low-risk and no further testing recommended prior to surgery   - Analgesia and Pain control with Hydro-codone-Acetaminophen 1-2 tab po q6hprn Moderate Pain and with IV Morphine 0.5-1 mg q2hprn Severe Pain -Bowel Regimen with Senna-Docusate 1 tab po qHSprn for mild Constipation and Bisacodyl EC tablet 5 mg po Dailyprn Moderate Constipation  -Discussed VTE Prophylaxis with Orthopedics and will start Lovenox 40 mg sq q24h for 3 weeks -PT/OT Recommending SNF; Social Work consulted for Placement   Type II DM  -No A1c on file; Checked HbA1c and was 6.7 -Managed at home with Glipizide, Metformin, and Actos, held on admission  -C/w Moderate Novolog Insulin SSI q4h -CBG's ranging from  [phone]  Hypertension  -BP at goal at 119/52 -[facility] Llisinopril and HCTZ,  -Continue to use Hydralazine 10 mg IV q4hprn for SBP >165 or DBP >90  Depression and Anxiety  -Stable  -Continue Escitalopram 10 mg po qHS and Lorazepam 0.5 mg po  q6hprn Anxiety/Sleep   Normocytic Anemia  - Hgb 10.2 on admission with no prior CBC on record  - No bleeding is evident; Continue to Monitor for S/Sx of Bleeding -Hb/Hct went from 10.2/32.5 -> 8.9/27.8 -> 9.9/31.2 -> 9.1/28.8 -Type and Screen,  -Repeat CBC in am    Hx of Breast Cancer  -Status-post remote mastectomy and chemotherapy  -Continues follow-up with no recurrence   Hypomagnesemia -Patient's Mag Level was 1.5 and improved to 1.9 -Continue to Monitor and Replete as Necessary -Repeat Mag Level in AM  HLD -Lipid Panel showed Cholesterol of 139, HDL of 67, LDL of 60, TG of 59, VLDL of 12 -C/w Simvastatin 10 mg po Daily  Mild AKI -BUN/Cr went from 25/0.74 -> 30/1.03 -> 31/0.93 -Restarted Gentle IVF Rehydration with NS at 50 mL/hr yesterday but now discontinued  -Avoid Nephrotoxic Medications  -Repeat CMP   DVT prophylaxis: SCDs Code Status: FULL CODE Family Communication: Discussed with son at bedside Disposition Plan: Remain Inpatient for ORIF on 07/01/17  Consultants:  Orthopedic Surgery Dr. Doreatha Hughes  Procedures:  ORIF of Left Olecranon    Antimicrobials:  Anti-infectives (From admission, onward)   Start     Dose/Rate Route Frequency Ordered Stop   07/01/17 1600  ceFAZolin (ANCEF) IVPB 2g/100 mL premix     2 g 200 mL/hr over 30 Minutes Intravenous Every 8 hours 07/01/17 1051 07/02/17 1559   07/01/17 1101  vancomycin (VANCOCIN) powder  Status:  Discontinued       As needed 07/01/17 1102 07/01/17 1102   07/01/17 0600  ceFAZolin (ANCEF) IVPB 2g/100 mL premix     2 g 200 mL/hr over 30 Minutes Intravenous On call to O.R. 06/30/17 1246 07/01/17 0759     Subjective: Seen and examined after surgery and stated her throat was hurting along with her elbow. No CP or SOB. No other concerns or complaints at this time.  Objective: Vitals:   07/01/17 1037 07/01/17 1038 07/01/17 1059 07/01/17 1453  BP:   (!) 119/54 (!) 119/52  Pulse: 80  74 72  Resp: 17     Temp:   98.4 F (36.9 C) 98.4 F (36.9 C) 98.5 F (36.9 C)  TempSrc:   Oral Oral  SpO2: 99%  (!) 89% 96%  Weight:      Height:        Intake/Output Summary (Last 24 hours) at 07/01/2017 1628 Last data filed at 07/01/2017 1100 Gross per 24 hour  Intake 1200 ml  Output 525 ml  Net 675 ml   Filed Weights   06/28/17 1825  Weight: 61.2 kg (135 lb)   Examination: Physical Exam:  Constitutional: WN/WD Caucasian female in NAD appears calm  Eyes: Sclerae anicteric. Has Ecchymosis over left eye  ENMT: External ears and nose appear normal. MMM. Grossly normal hearing Neck: Supple with no JVD Respiratory: Diminished to auscultation. No appreciable wheezing/rales/rhonchi Cardiovascular: RRR; No appreciable m/r/g. No extremity edema Abdomen: Soft, NT, ND. Bowel sounds present GU: Deferred Musculoskeletal: Left arm in sling Skin: No rashes or lesions on a limited skin eval. Warm and dry Neurologic: CN 2-12 grossly intact. No appreciable focal deficits Psychiatric: Normal mood and affect. Intact judgement and insight  Data Reviewed: I have personally  reviewed following labs and imaging studies  CBC: Recent Labs  Lab 06/28/17 1956 06/29/17 0852 06/30/17 1010 07/01/17 0636  WBC 6.8 7.3 8.9 6.7  NEUTROABS 5.5 5.8 7.4 5.0  HGB 10.2* 8.9* 9.9* 9.1*  HCT 32.5* 27.8* 31.2* 28.8*  MCV 93.1 91.4 91.5 91.4  PLT 180 156 171 182   Basic Metabolic Panel: Recent Labs  Lab 06/28/17 1956 06/29/17 0852 06/30/17 1010 07/01/17 0636  NA 138 140 138 138  K 4.1 3.7 3.7 4.2  CL 101 103 104 104  CO2 26 27 26 25   GLUCOSE 257* 92 156* 158*  BUN 34* 25* 30* 31*  CREATININE 0.99 0.74 1.03* 0.93  CALCIUM 8.7* 8.4* 8.5* 8.6*  MG  --  1.5* 2.0 1.9  PHOS  --  3.6 3.7 3.6   GFR: Estimated Creatinine Clearance: 47.4 mL/min (by C-G formula based on SCr of 0.93 mg/dL). Liver Function Tests: Recent Labs  Lab 06/29/17 0852 06/30/17 1010 07/01/17 0636  AST 16 16 21   ALT 11* 11* 11*  ALKPHOS 50 56  50  BILITOT 0.8 0.9 0.8  PROT 5.7* 5.8* 5.6*  ALBUMIN 3.1* 3.3* 3.1*   No results for input(s): LIPASE, AMYLASE in the last 168 hours. No results for input(s): AMMONIA in the last 168 hours. Coagulation Profile: No results for input(s): INR, PROTIME in the last 168 hours. Cardiac Enzymes: No results for input(s): CKTOTAL, CKMB, CKMBINDEX, TROPONINI in the last 168 hours. BNP (last 3 results) No results for input(s): PROBNP in the last 8760 hours. HbA1C: Recent Labs    06/30/17 1010  HGBA1C 6.7*   CBG: Recent Labs  Lab 07/01/17 0107 07/01/17 0409 07/01/17 0927 07/01/17 1202 07/01/17 1626  GLUCAP 63* 143* 137* 167* 246*   Lipid Profile: Recent Labs    06/30/17 1010  CHOL 139  HDL 67  LDLCALC 60  TRIG 59  CHOLHDL 2.1   Thyroid Function Tests: No results for input(s): TSH, T4TOTAL, FREET4, T3FREE, THYROIDAB in the last 72 hours. Anemia Panel: No results for input(s): VITAMINB12, FOLATE, FERRITIN, TIBC, IRON, RETICCTPCT in the last 72 hours. Sepsis Labs: No results for input(s): PROCALCITON, LATICACIDVEN in the last 168 hours.  Recent Results (from the past 240 hour(s))  Surgical pcr screen     Status: None   Collection Time: 06/29/17  1:36 AM  Result Value Ref Range Status   MRSA, PCR NEGATIVE NEGATIVE Final   Staphylococcus aureus NEGATIVE NEGATIVE Final    Comment: (NOTE) The Xpert SA Assay (FDA approved for NASAL specimens in patients 33 years of age and older), is one component of a comprehensive surveillance program. It is not intended to diagnose infection nor to guide or monitor treatment.     Radiology Studies: Dg Elbow 2 Views Left  Result Date: 07/01/2017 CLINICAL DATA:  ORIF left elbow. EXAM: DG C-ARM 61-120 MIN; LEFT ELBOW - 2 VIEW COMPARISON:  06/28/2017. FINDINGS: ORIF left olecranon. Hardware intact. Anatomic alignment. No new abnormality identified. Prior ORIF proximal left humerus. IMPRESSION: ORIF left olecranon with anatomic alignment .  Electronically Signed   By: Marcello Moores  Register   On: 07/01/2017 13:12   Dg Elbow 2 Views Left  Result Date: 07/01/2017 CLINICAL DATA:  Fracture of the proximal ulna. EXAM: LEFT ELBOW - 2 VIEW COMPARISON:  Radiographs dated 06/28/2017 FINDINGS: The patient has undergone open reduction and internal fixation of the proximal ulna fracture. Alignment and position of the fracture fragments is anatomic. Hardware appears in good position. Distal humerus and radius appear intact.  IMPRESSION: Excellent alignment and position after open reduction and internal fixation of proximal ulna fracture. Electronically Signed   By: Lorriane Shire M.D.   On: 07/01/2017 10:18   Dg Pelvis Portable  Result Date: 07/01/2017 CLINICAL DATA:  Left hip pain secondary to recent fall. EXAM: PORTABLE PELVIS 1-2 VIEWS COMPARISON:  Radiographs dated 06/28/2017 FINDINGS: There is a comminuted nondisplaced fracture left greater trochanter. This appears unchanged. Pelvic bones are intact.  No dislocation of the left hip prosthesis. IMPRESSION: Stable appearance of the comminuted fracture of the left greater trochanter. Electronically Signed   By: Lorriane Shire M.D.   On: 07/01/2017 10:16   Dg C-arm 1-60 Min  Result Date: 07/01/2017 CLINICAL DATA:  ORIF left elbow. EXAM: DG C-ARM 61-120 MIN; LEFT ELBOW - 2 VIEW COMPARISON:  06/28/2017. FINDINGS: ORIF left olecranon. Hardware intact. Anatomic alignment. No new abnormality identified. Prior ORIF proximal left humerus. IMPRESSION: ORIF left olecranon with anatomic alignment . Electronically Signed   By: [place]   On: 07/01/2017 13:12   Scheduled Meds: . aspirin EC  81 mg Oral Daily  . escitalopram  10 mg Oral QHS  . guaiFENesin  1,200 mg Oral BID  . insulin aspart  0-15 Units Subcutaneous Q4H  . multivitamin with minerals  1 tablet Oral Daily  . simvastatin  10 mg Oral q1800   Continuous Infusions: .  ceFAZolin (ANCEF) IV      LOS: 3 days   Kerney Elbe, DO [facility] Pager [phone]  If 7PM-7AM, please contact night-coverage [email or web address] Password [facility] 07/01/2017, 4:28 PM

## 2017-07-01 NOTE — Progress Notes (Signed)
Patient returns to room 527 at this time from OR

## 2017-07-01 NOTE — Op Note (Signed)
OrthopaedicSurgeryOperativeNote (UKG:[id number]) Date of Surgery: 07/01/2017  Admit Date: 06/28/2017   Diagnoses: Pre-Op Diagnoses: Left displaced olecranon fracture  Post-Op Diagnosis: Same  Procedures: CPT 24685-ORIF of left olecranon  Surgeons: Primary: Shona Needles, MD   Assistant: Ainsley Spinner, PA-C  Location:[facility] OR ROOM 05   AnesthesiaGeneral   Antibiotics:Ancef 2g preop   Tourniquettime:None used.  [id number]:62 mL   Complications:None  Specimens:None  Implants: Implant Name Type Inv. Item Serial No. Manufacturer Lot No. LRB No. Used Action  PLATE ELBOW LT PROX 6.9/4.8 2H - [id number] Plate PLATE ELBOW LT PROX 0.9/3.8 2H  [employer]  Left 1 Implanted  SCREW LOCKING 2.7X44MM VA - [id number] Screw SCREW LOCKING 2.7X44MM VA  [employer]  Left 1 Implanted  SCREW LOCKING 2.7X20MM - [id number] Screw SCREW LOCKING 2.7X20MM  [employer]  Left 2 Implanted  SCREW LOCKING 2.7X24MM - [id number] Screw SCREW LOCKING 2.7X24MM  [employer]  Left 1 Implanted  SCREW LOCKING 2.7X60MM - [id number] Screw SCREW LOCKING 2.7X60MM  [employer]  Left 1 Implanted  SCREW LOCKING VA 2.7X50MM - [id number] Screw SCREW LOCKING VA 2.7X50MM  [employer]  Left 1 Implanted  SCREW LOCKING 3.5X22 - [id number] Screw SCREW LOCKING 3.5X22  [employer]  Left 1 Implanted  SCREW CORTEX 3.5 20MM - [id number] Screw SCREW CORTEX 3.5 20MM  [employer]  Left 1 Implanted    IndicationsforSurgery: This is a 81 year old female who sustained a ground-level fall.  She is fractured her left olecranon and sustained a nondisplaced left greater trochanteric periprosthetic fracture.  Felt that her greater trochanteric fracture could be treated nonoperatively.  However with the displacement of her olecranon I felt that it would be better to fix with open reduction internal fixation.  Risks and benefits were discussed with the patient and her son. Risks discussed included bleeding  requiring blood transfusion, bleeding causing a hematoma, infection, malunion, nonunion, damage to surrounding nerves and blood vessels, pain, hardware prominence or irritation, hardware failure, stiffness, post-traumatic arthritis, DVT/PE, compartment syndrome, and even death. The patient and their family agreed to proceed with surgery and consent was obtained.  Operative Findings: Displaced transverse olecranon fracture treated with open reduction internal fixation using [employer] 2-hole variable angle olecranon plate  Procedure: The patient was identified in the preoperative holding area. Consent was confirmed with the patient and their family and all questions were answered. The operative extremity was marked after confirmation with the patient. The patient was then brought back to the operating room by our anesthesia colleagues.  The patient was then placed under general anesthetic and carried over to a radiolucent flat top table.  Here they were placed in the lateral decubitus position with the operative side up.  The down arm was well padded and positioned out of the way of fluoroscopy.  The operative arm was then draped over a pile of blankets to have  easy access to the olecranon fracture. The operative extremity was then prepped and draped in usual sterile fashion. A preoperative timeout was performed to verify the patient, the procedure, and the extremity. Preoperative antibiotics were dosed.  A standard posterior approach to the olecranon was made.  It was carried down through skin and subcutaneous tissue.  The fracture with was exposed.  Periosteum was removed from the fracture edges.  The hematoma was cleaned out and irrigated.  The incision was extended between the FCU and ECU down to the ulna.  The periosteum was reflected off of the ulnar shaft to be able to place the olecranon  plate.  Once the fracture was cleaned out a towel clip was used to manipulate the proximal fragment with the triceps  attachment.  A 2.5 millimeter drill bit was used to place a drill hole into the ulnar shaft.  A reduction forceps was placed in this hole and the other tine was placed along the proximal end of the fracture fragment.  The fracture was reduced and then pinned in place with a number of 1.6 mm K wires.  A two-hole proximal ulna VA locking plate was then contoured to fit along the olecranon.  The triceps was split to allow the plate to be more flush to bone.  The reduction clamp was used to compress the plate down to the proximal segment.  A nonlocking plate screw was placed in the shaft to align the distal portion of the plate.  I then used the [place] guide to proceed to place locking screws both across the fracture and into the proximal segment.  I obtained a total of 3 Screws Crossing the fracture fragment through the plate and another screw going from distal to proximal gaining purchase in the fracture fragment.  A another locking screw was placed in the distal shaft through the plate.  Final fluoroscopic images were obtained.  The incision was thoroughly irrigated.  A gram of vancomycin powder was placed into the incision.  A 0 Vicryl was used to close the soft tissue over the plate.  The skin was then closed with 2-0 Vicryl and 3-0 nylon.  A sterile dressing consisting of Mepilex and Ace wraps was used to dress the arm.  She was then placed back in her sling.  She was placed supine, extubated and taken to the PACU in stable condition.  Post Op Plan/Instructions: The patient will be nonweightbearing to the left upper extremity.  She may start range of motion of the elbow right away.  She may continue with the sling as needed for comfort.  She will receive postoperative Ancef.  DVT prophylaxis will be at the discretion of the primary team.  I was present and performed the entire surgery.  Ainsley Spinner, PA-C did assist me throughout the case. An assistant was necessary given the difficulty in approach,  maintenance of reduction and ability to instrument the fracture.   Katha Hamming, MD [facility]

## 2017-07-01 NOTE — Anesthesia Procedure Notes (Signed)
Procedure Name: Intubation Date/Time: 07/01/2017 7:43 AM Performed by: Freddie Breech, CRNA Pre-anesthesia Checklist: Patient identified, Emergency Drugs available, Suction available and Patient being monitored Patient Re-evaluated:Patient Re-evaluated prior to induction Oxygen Delivery Method: Circle System Utilized Preoxygenation: Pre-oxygenation with 100% oxygen Induction Type: IV induction Ventilation: Mask ventilation without difficulty Laryngoscope Size: Mac and 3 Grade View: Grade III Tube type: Oral Tube size: 7.0 mm Number of attempts: 1 Airway Equipment and Method: Stylet and Oral airway Placement Confirmation: ETT inserted through vocal cords under direct vision,  positive ETCO2 and breath sounds checked- equal and bilateral Tube secured with: Tape Dental Injury: Teeth and Oropharynx as per pre-operative assessment

## 2017-07-01 NOTE — Clinical Social Work Note (Signed)
Clinical Social Work Assessment  Patient Details  Name: Melanie Hughes MRN: [id number] Date of Birth: 20-Jul-1935  Date of referral:  07/01/17               Reason for consult:  Facility Placement                Permission sought to share information with:  Facility Art therapist granted to share information::  Yes, Verbal Permission Granted  Name::     Melanie Hughes  Agency::  SNF  Relationship::   son  Contact Information:     Housing/Transportation Living arrangements for the past 2 months:  Single Family Home Source of Information:  Patient, Adult Children Patient Interpreter Needed:  None Criminal Activity/Legal Involvement Pertinent to Current Situation/Hospitalization:  No - Comment as needed Significant Relationships:  Adult Children, Other Family Members Lives with:  Self Do you feel safe going back to the place where you live?  No Need for family participation in patient care:  Yes (Comment)  Care giving concerns:  Pt resides home alone and was managing ADL's independently prior to impairment. Pt ambulates independently. Given new impairment, pt will be unable to return home at this time.  Social Worker assessment / plan:  To assist with disposition to facility in [place]. CSW will f/u with admission staff at [facility] as pt has experience at [facility].   Employment status:  Retired Nurse, adult PT Recommendations:  [place] / Referral to community resources:  [place]  Patient/Family's Response to care:  Patient and son at bedside and appreciative of CSW assisting with SNF placement process.   Patient/Family's Understanding of and Emotional Response to Diagnosis, Current Treatment, and Prognosis:  Patient and son understand the left arm and hip impairment and the limitations. Pt needs assistance and cannot get around at home on her own. Pt and son, hopes that she will have a  short stay at [facility] in [place] and return to her independence. No other issues or concerns identified.  Emotional Assessment Appearance:  Appears stated age Attitude/Demeanor/Rapport:  (Cooperative) Affect (typically observed):  Accepting, Appropriate Orientation:  Oriented to Situation, Oriented to  Time, Oriented to Place, Oriented to Self Alcohol / Substance use:  Not Applicable Psych involvement (Current and /or in the community):  No (Comment)  Discharge Needs  Concerns to be addressed:  Discharge Planning Concerns Readmission within the last 30 days:  No Current discharge risk:  Physical Impairment, Dependent with Mobility Barriers to Discharge:  No Barriers Identified   Melanie Baxter, LCSW 07/01/2017, 4:11 PM

## 2017-07-01 NOTE — Transfer of Care (Signed)
Immediate Anesthesia Transfer of Care Note  Patient: Melanie Hughes  Procedure(s) Performed: OPEN REDUCTION INTERNAL FIXATION (ORIF) ELBOW/OLECRANON FRACTURE (Left Elbow)  Patient Location: PACU  Anesthesia Type:General  Level of Consciousness: drowsy and patient cooperative  Airway & Oxygen Therapy: Patient Spontanous Breathing and Patient connected to face mask oxygen  Post-op Assessment: Report given to RN and Post -op Vital signs reviewed and stable  Post vital signs: Reviewed and stable  Last Vitals:  Vitals:   07/01/17 0404 07/01/17 0925  BP: (!) 122/58 135/69  Pulse: 67 80  Resp: 18 10  Temp: 37.1 C 36.9 C  SpO2: 98% 100%    Last Pain:  Vitals:   07/01/17 0404  TempSrc: Oral  PainSc:       Patients Stated Pain Goal: 2 (18/40/37 5436)  Complications: No apparent anesthesia complications

## 2017-07-01 NOTE — Social Work (Signed)
CSW called Robin/Casey at [facility] in [place] and they indicated that they are reviewing clinicals and will let CSW know.  CSW waiting to hear back before sending clinicals to [facility] as Insurance Auth will be needed for SNF placement.  Elissa Hefty, LCSW Clinical Social Worker [phone]

## 2017-07-01 NOTE — Plan of Care (Signed)
  Progressing Clinical Measurements: Will remain free from infection 07/01/2017 1332 - Progressing by Rance Muir, RN

## 2017-07-01 NOTE — Interval H&P Note (Signed)
History and Physical Interval Note:  07/01/2017 6:56 AM  Melanie Hughes  has presented today for surgery, with the diagnosis of LEFT  OLECRANON FRACTURE  The various methods of treatment have been discussed with the patient and family. After consideration of risks, benefits and other options for treatment, the patient has consented to  Procedure(s): OPEN REDUCTION INTERNAL FIXATION (ORIF) ELBOW/OLECRANON FRACTURE (Left) as a surgical intervention .  The patient's history has been reviewed, patient examined, no change in status, stable for surgery.  I have reviewed the patient's chart and labs.  Questions were answered to the patient's satisfaction.     Haddix, Thomasene Lot

## 2017-07-01 NOTE — Anesthesia Postprocedure Evaluation (Signed)
Anesthesia Post Note  Patient: Melanie Hughes  Procedure(s) Performed: OPEN REDUCTION INTERNAL FIXATION (ORIF) ELBOW/OLECRANON FRACTURE (Left Elbow)     Patient location during evaluation: PACU Anesthesia Type: General Level of consciousness: awake and alert Pain management: pain level controlled Vital Signs Assessment: post-procedure vital signs reviewed and stable Respiratory status: spontaneous breathing, nonlabored ventilation, respiratory function stable and patient connected to nasal cannula oxygen Cardiovascular status: blood pressure returned to baseline and stable Postop Assessment: no apparent nausea or vomiting Anesthetic complications: no    Last Vitals:  Vitals:   07/01/17 1059 07/01/17 1453  BP: (!) 119/54 (!) 119/52  Pulse: 74 72  Resp:    Temp: 36.9 C 36.9 C  SpO2: (!) 89% 96%    Last Pain:  Vitals:   07/01/17 1453  TempSrc: Oral  PainSc:                  Ryan P Ellender

## 2017-07-01 NOTE — NC FL2 (Signed)
[place] LEVEL OF CARE SCREENING TOOL     IDENTIFICATION  Patient Name: Melanie Hughes Birthdate: 1935-11-28 Sex: female Admission Date (Current Location): 06/28/2017  [facility] [place] Number:  Herbalist and Address:  [facility], [place] [address] [place], [place] [address]      Provider Number: [id number]  Attending Physician Name and Address:  Kerney Elbe, DO  Relative Name and Phone Number:  Stormey Wilborn, son, [phone]    Current Level of Care: Hospital Recommended Level of Care: [place] Prior Approval Number:    Date Approved/Denied:   PASRR Number:    Discharge Plan: SNF    Current Diagnoses: Patient Active Problem List   Diagnosis Date Noted  . Normocytic anemia 06/29/2017  . Hx of breast cancer 06/29/2017  . Hypomagnesemia 06/29/2017  . Hyperlipidemia 06/29/2017  . Diabetes mellitus without complication ([place]) 96/75/9163  . Hypertension 06/28/2017  . Closed left hip fracture, initial encounter ([place]) 06/28/2017  . Closed fracture of left olecranon process 06/28/2017  . Anxiety 06/28/2017  . Closed displaced fracture of greater trochanter of left femur ([facility])     Orientation RESPIRATION BLADDER Height & Weight     Self, Time, Situation, Place  Normal Continent Weight: 135 lb (61.2 kg) Height:  5\' 7"  (170.2 cm)  BEHAVIORAL SYMPTOMS/MOOD NEUROLOGICAL BOWEL NUTRITION STATUS      Continent Diet(See DC Summary)  AMBULATORY STATUS COMMUNICATION OF NEEDS Skin   Extensive Assist Verbally Surgical wounds                       Personal Care Assistance Level of Assistance  Bathing, Feeding, Dressing Bathing Assistance: Maximum assistance Feeding assistance: Limited assistance Dressing Assistance: Maximum assistance     Functional Limitations Info  Sight, Hearing, Speech Sight Info: Adequate Hearing Info: Adequate Speech Info: Adequate    SPECIAL CARE FACTORS FREQUENCY   PT (By licensed PT), OT (By licensed OT)     PT Frequency: 5x week OT Frequency: 5x week            Contractures Contractures Info: Not present    Additional Factors Info  Code Status, Allergies, Psychotropic, Insulin Sliding Scale Code Status Info: Full  Allergies Info: Penicillins Psychotropic Info: Lexapro Insulin Sliding Scale Info: [place] every 4 hours       Current Medications (07/01/2017):  This is the current hospital active medication list Current Facility-Administered Medications  Medication Dose Route Frequency Provider Last Rate Last Dose  . aspirin EC tablet 81 mg  81 mg Oral Daily Raiford Noble [place], DO   81 mg at 06/30/17 8466  . bisacodyl (DULCOLAX) EC tablet 5 mg  5 mg Oral Daily PRN Opyd, Ilene Qua, MD      . ceFAZolin (ANCEF) IVPB 2g/100 mL premix  2 g Intravenous Q8H Haddix, Thomasene Lot, MD      . escitalopram (LEXAPRO) tablet 10 mg  10 mg Oral QHS Opyd, Ilene Qua, MD   10 mg at 06/30/17 2208  . guaiFENesin (MUCINEX) 12 hr tablet 1,200 mg  1,200 mg Oral BID Raiford Noble [place], DO   1,200 mg at 07/01/17 1238  . hydrALAZINE (APRESOLINE) injection 10 mg  10 mg Intravenous Q4H PRN Opyd, Ilene Qua, MD      . HYDROcodone-acetaminophen (NORCO/VICODIN) 5-325 MG per tablet 1-2 tablet  1-2 tablet Oral Q6H PRN Opyd, Ilene Qua, MD   2 tablet at 07/01/17 1419  . insulin aspart (novoLOG)  injection 0-15 Units  0-15 Units Subcutaneous Q4H Opyd, Ilene Qua, MD   3 Units at 07/01/17 1238  . LORazepam (ATIVAN) tablet 0.5 mg  0.5 mg Oral Q6H PRN Opyd, Ilene Qua, MD   0.5 mg at 06/30/17 2208  . morphine 2 MG/ML injection 0.5-1 mg  0.5-1 mg Intravenous Q2H PRN Opyd, Ilene Qua, MD   1 mg at 07/01/17 1238  . multivitamin with minerals tablet 1 tablet  1 tablet Oral Daily Opyd, Ilene Qua, MD   1 tablet at 06/30/17 0947  . phenol (CHLORASEPTIC) mouth spray 1 spray  1 spray Mouth/Throat PRN Raiford Noble [place], DO   1 spray at 07/01/17 1239  . senna-docusate (Senokot-S) tablet 1 tablet   1 tablet Oral QHS PRN Opyd, Ilene Qua, MD      . simvastatin (ZOCOR) tablet 10 mg  10 mg Oral q1800 Opyd, Ilene Qua, MD   10 mg at 06/30/17 1708     Discharge Medications: Please see discharge summary for a list of discharge medications.  Relevant Imaging Results:  Relevant Lab Results:   Additional Information SS#: [id number]  [place], LCSW

## 2017-07-02 LAB — COMPREHENSIVE METABOLIC PANEL
ALT: 9 U/L — AB (ref 14–54)
ANION GAP: 5 (ref 5–15)
AST: 14 U/L — ABNORMAL LOW (ref 15–41)
Albumin: 2.7 g/dL — ABNORMAL LOW (ref 3.5–5.0)
Alkaline Phosphatase: 43 U/L (ref 38–126)
BUN: 23 mg/dL — ABNORMAL HIGH (ref 6–20)
CHLORIDE: 106 mmol/L (ref 101–111)
CO2: 25 mmol/L (ref 22–32)
Calcium: 8.1 mg/dL — ABNORMAL LOW (ref 8.9–10.3)
Creatinine, Ser: 0.82 mg/dL (ref 0.44–1.00)
Glucose, Bld: 122 mg/dL — ABNORMAL HIGH (ref 65–99)
POTASSIUM: 4.1 mmol/L (ref 3.5–5.1)
SODIUM: 136 mmol/L (ref 135–145)
Total Bilirubin: 0.7 mg/dL (ref 0.3–1.2)
Total Protein: 5 g/dL — ABNORMAL LOW (ref 6.5–8.1)

## 2017-07-02 LAB — CBC WITH DIFFERENTIAL/PLATELET
Basophils Absolute: 0 10*3/uL (ref 0.0–0.1)
Basophils Relative: 0 %
EOS ABS: 0.1 10*3/uL (ref 0.0–0.7)
EOS PCT: 1 %
HCT: 26.4 % — ABNORMAL LOW (ref 36.0–46.0)
Hemoglobin: 8.5 g/dL — ABNORMAL LOW (ref 12.0–15.0)
LYMPHS ABS: 1.2 10*3/uL (ref 0.7–4.0)
Lymphocytes Relative: 14 %
MCH: 29.2 pg (ref 26.0–34.0)
MCHC: 32.2 g/dL (ref 30.0–36.0)
MCV: 90.7 fL (ref 78.0–100.0)
MONO ABS: 0.7 10*3/uL (ref 0.1–1.0)
Monocytes Relative: 8 %
Neutro Abs: 6.4 10*3/uL (ref 1.7–7.7)
Neutrophils Relative %: 77 %
PLATELETS: 164 10*3/uL (ref 150–400)
RBC: 2.91 MIL/uL — AB (ref 3.87–5.11)
RDW: 13.8 % (ref 11.5–15.5)
WBC: 8.3 10*3/uL (ref 4.0–10.5)

## 2017-07-02 LAB — GLUCOSE, CAPILLARY
GLUCOSE-CAPILLARY: 122 mg/dL — AB (ref 65–99)
GLUCOSE-CAPILLARY: 145 mg/dL — AB (ref 65–99)
GLUCOSE-CAPILLARY: 201 mg/dL — AB (ref 65–99)
GLUCOSE-CAPILLARY: 80 mg/dL (ref 65–99)
Glucose-Capillary: 250 mg/dL — ABNORMAL HIGH (ref 65–99)
Glucose-Capillary: 132 mg/dL — ABNORMAL HIGH (ref 65–99)

## 2017-07-02 LAB — PHOSPHORUS: PHOSPHORUS: 3.4 mg/dL (ref 2.5–4.6)

## 2017-07-02 LAB — MAGNESIUM: MAGNESIUM: 1.6 mg/dL — AB (ref 1.7–2.4)

## 2017-07-02 MED ORDER — MAGNESIUM SULFATE 2 GM/50ML IV SOLN
2.0000 g | Freq: Once | INTRAVENOUS | Status: AC
  Administered 2017-07-02: 2 g via INTRAVENOUS
  Filled 2017-07-02: qty 50

## 2017-07-02 NOTE — Progress Notes (Signed)
    Subjective: Patient reports pain as moderate.  Tolerating diet.  Urinating.   No CP, SOB.   Objective:   VITALS:   Vitals:   07/01/17 1453 07/01/17 2046 07/02/17 0013 07/02/17 0413  BP: (!) 119/52 (!) 130/58 (!) 108/51 (!) 119/56  Pulse: 72 70 67 72  Resp:  16 12 13   Temp: 98.5 F (36.9 C) 98 F (36.7 C) 97.9 F (36.6 C) 97.8 F (36.6 C)  TempSrc: Oral Oral Oral Oral  SpO2: 96% 100% 96% 95%  Weight:      Height:       CBC Latest Ref Rng & Units 07/02/2017 07/01/2017 06/30/2017  WBC 4.0 - 10.5 K/uL 8.3 6.7 8.9  Hemoglobin 12.0 - 15.0 g/dL 8.5(L) 9.1(L) 9.9(L)  Hematocrit 36.0 - 46.0 % 26.4(L) 28.8(L) 31.2(L)  Platelets 150 - 400 K/uL 164 151 171   BMP Latest Ref Rng & Units 07/02/2017 07/01/2017 06/30/2017  Glucose 65 - 99 mg/dL 122(H) 158(H) 156(H)  BUN 6 - 20 mg/dL 23(H) 31(H) 30(H)  Creatinine 0.44 - 1.00 mg/dL 0.82 0.93 1.03(H)  Sodium 135 - 145 mmol/L 136 138 138  Potassium 3.5 - 5.1 mmol/L 4.1 4.2 3.7  Chloride 101 - 111 mmol/L 106 104 104  CO2 22 - 32 mmol/L 25 25 26   Calcium 8.9 - 10.3 mg/dL 8.1(L) 8.6(L) 8.5(L)   Intake/Output      12/28 0701 - 12/29 0700 12/29 0701 - 12/30 0700   I.V. (mL/kg) 1200 (19.6)    Total Intake(mL/kg) 1200 (19.6)    Urine (mL/kg/hr) 950 (0.6)    Blood 75    Total Output 1025    Net +175           Physical Exam: General: NAD.  Upright in bed.  Calm, conversant.  Family at bedside.  No increased wob  MSK LUE: Ace wrap in place.  Dressings clean and dry.  Hand warm with significant expected swelling.  NVI.  No tenderness in the hand.  Tolerating small early range of motion left elbow. Neurovascularly intact LLE: Mild tenderness over greater trochanter.  No lesions.  NVI distally.  Assessment / Plan: 1 Day Post-Op  S/P Procedure(s) (LRB): OPEN REDUCTION INTERNAL FIXATION (ORIF) ELBOW/OLECRANON FRACTURE (Left) by Dr. Doreatha Martin on 07/01/17  Principal Problem:   Closed left hip fracture, initial encounter [facility]) Active  Problems:   Diabetes mellitus without complication ([place])   Hypertension   Closed fracture of left olecranon process   Anxiety   Normocytic anemia   Hx of breast cancer   Hypomagnesemia   Hyperlipidemia   Left Olecranon Fracture S/P ORIF Non weight bearing.  No ROM Restrictions Dry Dressings PRN  Left Greater Trochanteric Fracture: Nonoperative management. Weightbearing as tolerated.  No abduction against resistance left hip.  Weight Bearing: NWB LUE.  WBAT LLE - No abduction against resistance left hip.  Dressings: Dry Dressings PRN VTE prophylaxis: per primary   Prudencio Burly III, PA-C 07/02/2017, 9:06 AM

## 2017-07-02 NOTE — Progress Notes (Signed)
Physical Therapy Treatment Patient Details Name: Melanie Hughes MRN: [id number] DOB: 12/13/1935 Today's Date: 07/02/2017    History of Present Illness 81 year old female with fall and nondisplaced left greater trochanteric fracture around arthroplasty.  She also has a displaced left olecranon fracture, now s/p ORIF 12/28. Pertinent PMH includes: DM, HTN, Breast CA, L TKA    PT Comments    Patient progressing very well with therapy. Now +1 for assistance, able to ambulate in room and demonstrating increased independence with all aspects of mobility. Session worked with gait training and transfers. Next PT visit will increase ambulation distance into hallway with chair follow. Patient eager to improve and will do fantastic at next venue of care before return home.      Follow Up Recommendations  SNF;Supervision/Assistance - 24 hour     Equipment Recommendations  None recommended by PT    Recommendations for Other Services       Precautions / Restrictions Restrictions Weight Bearing Restrictions: Yes LUE Weight Bearing: Non weight bearing LLE Weight Bearing: Weight bearing as tolerated Other Position/Activity Restrictions: no hip abduction against res    Mobility  Bed Mobility Overal bed mobility: Needs Assistance Bed Mobility: Supine to Sit     Supine to sit: Supervision     General bed mobility comments: able to supine to sit without physical assitance.  Transfers Overall transfer level: Needs assistance Equipment used: Rolling walker (2 wheeled) Transfers: Sit to/from [facility] Sit to Stand: Min assist Stand pivot transfers: Min assist       General transfer comment: Min A to power up, cues for hand placement and WB status.   Ambulation/Gait Ambulation/Gait assistance: Min assist Ambulation Distance (Feet): 20 Feet Assistive device: Rolling walker (2 wheeled) Gait Pattern/deviations: Step-to pattern;Antalgic Gait velocity: decreased    General Gait Details: patient ambulating in hospital room forward and backwards with min A utilizing RW. cues for Safety with RW, wb status, and sequencing with BLE   Stairs            Wheelchair Mobility    Modified Rankin (Stroke Patients Only)       Balance Overall balance assessment: History of Falls                                          Cognition Arousal/Alertness: Awake/alert Behavior During Therapy: WFL for tasks assessed/performed Overall Cognitive Status: Within Functional Limits for tasks assessed                                        Exercises General Exercises - Lower Extremity Ankle Circles/Pumps: AROM;Both;20 reps Quad Sets: AROM;Both;10 reps    General Comments        Pertinent Vitals/Pain Pain Assessment: Faces Faces Pain Scale: Hurts little more Pain Location: L arm Pain Descriptors / Indicators: Sore Pain Intervention(s): Limited activity within patient's tolerance;Monitored during session;Repositioned    Home Living                      Prior Function            PT Goals (current goals can now be found in the care plan section) Acute Rehab PT Goals Patient Stated Goal: return home but open to short term SNF first. PT Goal Formulation: With  patient Time For Goal Achievement: 07/07/17 Potential to Achieve Goals: Good Progress towards PT goals: Progressing toward goals    Frequency    Min 3X/week      PT Plan Current plan remains appropriate    Co-evaluation              AM-PAC PT "6 Clicks" Daily Activity  Outcome Measure  Difficulty turning over in bed (including adjusting bedclothes, sheets and blankets)?: A Little Difficulty moving from lying on back to sitting on the side of the bed? : A Little Difficulty sitting down on and standing up from a chair with arms (e.g., wheelchair, bedside commode, etc,.)?: A Little Help needed moving to and from a bed to chair  (including a wheelchair)?: A Little Help needed walking in hospital room?: A Little Help needed climbing 3-5 steps with a railing? : A Lot 6 Click Score: 17    End of Session Equipment Utilized During Treatment: Gait belt Activity Tolerance: Patient limited by fatigue Patient left: in chair;with call bell/phone within reach;with family/visitor present Nurse Communication: Mobility status PT Visit Diagnosis: Unsteadiness on feet (R26.81);Other abnormalities of gait and mobility (R26.89);History of falling (Z91.81);Pain Pain - Right/Left: Left Pain - part of body: Arm;Hip     Time: 1732-1800 PT Time Calculation (min) (ACUTE ONLY): 28 min  Charges:  $Gait Training: 8-22 mins $Therapeutic Activity: 8-22 mins                    G Codes:       Reinaldo Berber, PT, DPT Acute Rehab Services Pager: [phone]     Reinaldo Berber 07/02/2017, 6:09 PM

## 2017-07-02 NOTE — Progress Notes (Signed)
PROGRESS NOTE    Melanie Hughes  TFT:[id number] DOB: 03/30/1936 DOA: 06/28/2017 PCP: Alanda Amass, MD   Brief Narrative:  Melanie Hughes is a 81 y.o. female with medical history significant for Hypertension, Type 2 Diabetes Mellitus, History of Breast Cancer and other comorbids, now presenting to the emergency department with severe left hip and elbow pain after a ground-level mechanical fall.  She had been in her usual state of health and was having an uneventful day until she walked over an uneven surface, tripped, falling onto her left side, and experiencing immediate pain at the left elbow, hip, and periorbital region.  There was no loss of consciousness and she denies headache, vision change, nausea, vomiting, or focal numbness or weakness. She was brought to the ED  For evaluation and upon arrival to the ED, patient was found to be afebrile, saturating well on room air, and with stable blood pressure.  EKG featured a sinus rhythm, chest x-ray is negative for acute cardiopulmonary disease, and noncontrast head CT is negative for acute intracranial abnormality.    Radiographs of the left hip were suspicious for a nondisplaced fracture of the left greater trochanter adjacent to a bipolar density which remains intact.  Radiographs of the left elbow demonstrated an acute fracture of the left olecranon process with significant displacement of the olecranon fragment. Patient was treated with fentanyl in the ED and Orthopedic Surgery was consulted.  Orthopedic surgery recommended medical admission to [facility] and patient underwent ORIF of Left Olecranon Fx on 07/01/17 by Dr. Doreatha Martin and is POD 1 and doing well.   Assessment & Plan:   Principal Problem:   Closed left hip fracture, initial encounter [facility]) Active Problems:   Diabetes mellitus without complication ([place])   Hypertension   Closed fracture of left olecranon process   Anxiety   Normocytic anemia   Hx of breast cancer  Hypomagnesemia   Hyperlipidemia  Closed Left Greater Trochanter Left Olecranon Fractures s/p ORIF POD1 - Presented with severe pain in left hip and left elbow after a ground-level mechanical fall at home - Radiographs demonstrate fractures involving left olecranon process and left greater trochanter; [place] CT negative  - She was unable to bear weight in ED  - Orthopedic surgery is consulting and appreciate additional Recc's; Patient underwent ORIF for Left Olecranon Fx on 07/01/17 and is POD 1 -Per Ortho Left Olecranon Fx has Non-Weight Bearing and No ROM Restrictions; Recommending daily dressing changes PRN -Per Ortho Left Greater Trochanter Fx does not need intervention and recommends non-operative treatment and can be WBAT with no active Abduction currently; Discussed with Orthopedics and ok to Abduct in 1 month  - Pt typically enjoys good health, is active and independent, and ascends a flight of stairs without difficulty prior to injury - Based on available data, Melanie Hughes presents an estimated 0.3% risk probability for perioperative MI or cardiac arrest per Melburn Hake al; this is low-risk and no further testing recommended prior to surgery   - Analgesia and Pain control with Hydro-codone-Acetaminophen 1-2 tab po q6hprn Moderate Pain and with IV Morphine 0.5-1 mg q2hprn Severe Pain; Patient complaining of some pain last night  -Bowel Regimen with Senna-Docusate 1 tab po qHSprn for mild Constipation and Bisacodyl EC tablet 5 mg po Dailyprn Moderate Constipation  -Discussed VTE Prophylaxis with Orthopedics  started Lovenox 40 mg sq q24h for 3 weeks -PT/OT Recommending SNF; Social Work consulted for Placement; In the process of review per LCSW note  Type II DM  -  No A1c on file; Checked HbA1c and was 6.7 -Managed at home with Glipizide, Metformin, and Actos, held on admission  -C/w Moderate Novolog Insulin SSI q4h -CBG's ranging from 80-201  Hypertension  -BP at goal at 119/86 -[place] and HCTZ for now and can resume slowly  -Continue to use Hydralazine 10 mg IV q4hprn for SBP >165 or DBP >90  Depression and Anxiety  -Stable  -Continue Escitalopram 10 mg po qHS and Lorazepam 0.5 mg po q6hprn Anxiety/Sleep   Normocytic Anemia and ABLA in the setting of Surgery - Hgb 10.2 on admission with no prior CBC on record  - No bleeding is evident; Continue to Monitor for S/Sx of Bleeding -Hb/Hct went from 10.2/32.5 -> 8.5/26.4  -Expected Post Operative drop -Type and Screen,  -Repeat CBC in am    Hx of Breast Cancer  -Status-post remote mastectomy and chemotherapy  -Continues follow-up with no recurrence   Hypomagnesemia -Patient's Mag Level was 1.6 -Replete with IV Mag Sulfate 2 garms -Continue to Monitor and Replete as Necessary -Repeat Mag Level in AM  HLD -Lipid Panel showed Cholesterol of 139, HDL of 67, LDL of 60, TG of 59, VLDL of 12 -C/w Simvastatin 10 mg po Daily  Mild AKI -BUN/Cr went from 25/0.74 -> 30/1.03 -> 31/0.93 -> 23/0.82 -IVF now discontinued  -Avoid Nephrotoxic Medications  -Repeat CMP   DVT prophylaxis: SCDs; Enoxaparin 40 mg sq q24h Code Status: FULL CODE Family Communication: Discussed with family at Bedside Disposition Plan: SNF whenever bed is Available  Consultants:  Orthopedic Surgery Dr. Doreatha Martin  Procedures:  ORIF of Left Olecranon    Antimicrobials:  Anti-infectives (From admission, onward)   Start     Dose/Rate Route Frequency Ordered Stop   07/01/17 1600  ceFAZolin (ANCEF) IVPB 2g/100 mL premix     2 g 200 mL/hr over 30 Minutes Intravenous Every 8 hours 07/01/17 1051 07/02/17 1559   07/01/17 1101  vancomycin (VANCOCIN) powder  Status:  Discontinued       As needed 07/01/17 1102 07/01/17 1102   07/01/17 0600  ceFAZolin (ANCEF) IVPB 2g/100 mL premix     2 g 200 mL/hr over 30 Minutes Intravenous On call to O.R. 06/30/17 1246 07/01/17 0759     Subjective: Seen and examined and stated she had a rough  night with Pain. Did not like the morphine because she felt as if it made her disoriented. Had elbow pain but comfortable now. Has been having BM's. No other complaints.   Objective: Vitals:   07/01/17 1453 07/01/17 2046 07/02/17 0013 07/02/17 0413  BP: (!) 119/52 (!) 130/58 (!) 108/51 (!) 119/56  Pulse: 72 70 67 72  Resp:  16 12 13   Temp: 98.5 F (36.9 C) 98 F (36.7 C) 97.9 F (36.6 C) 97.8 F (36.6 C)  TempSrc: Oral Oral Oral Oral  SpO2: 96% 100% 96% 95%  Weight:      Height:        Intake/Output Summary (Last 24 hours) at 07/02/2017 9233 Last data filed at 07/01/2017 2046 Gross per 24 hour  Intake 1200 ml  Output 1025 ml  Net 175 ml   Filed Weights   06/28/17 1825  Weight: 61.2 kg (135 lb)   Examination: Physical Exam:  Constitutional: WN/WD Caucasian female in NAD appears calm  Eyes: Sclerae anicteric. Has bruise on left eye improving ENMT: External Ears and Nose appear normal. MMM; Grossly normal Hearing Neck: Supple with no JVD Respiratory: Diminished to auscultation; No appreciable wheezing/rales/rhonchi.  Unlabored breathing Cardiovascular: RRR; Slight murmur. No extremity edema Abdomen: Soft, NT, ND. Bowel sounds present GU: Deferred Musculoskeletal: Left Arm is wrapped in ACE bandage. No contractures Skin: Warm and Dry. Has a bruise on Left eye. Left Arm in Ace Bandage with some distal swelling.  Neurologic: CN 2-12 grossly intact. No appreciable focal deficits Psychiatric: Normal mood and affect. Intact judgement and insight  Data Reviewed: I have personally reviewed following labs and imaging studies  CBC: Recent Labs  Lab 06/28/17 1956 06/29/17 0852 06/30/17 1010 07/01/17 0636  WBC 6.8 7.3 8.9 6.7  NEUTROABS 5.5 5.8 7.4 5.0  HGB 10.2* 8.9* 9.9* 9.1*  HCT 32.5* 27.8* 31.2* 28.8*  MCV 93.1 91.4 91.5 91.4  PLT 180 156 171 542   Basic Metabolic Panel: Recent Labs  Lab 06/28/17 1956 06/29/17 0852 06/30/17 1010 07/01/17 0636  NA 138 140 138  138  K 4.1 3.7 3.7 4.2  CL 101 103 104 104  CO2 26 27 26 25   GLUCOSE 257* 92 156* 158*  BUN 34* 25* 30* 31*  CREATININE 0.99 0.74 1.03* 0.93  CALCIUM 8.7* 8.4* 8.5* 8.6*  MG  --  1.5* 2.0 1.9  PHOS  --  3.6 3.7 3.6   GFR: Estimated Creatinine Clearance: 47.4 mL/min (by C-G formula based on SCr of 0.93 mg/dL). Liver Function Tests: Recent Labs  Lab 06/29/17 0852 06/30/17 1010 07/01/17 0636  AST 16 16 21   ALT 11* 11* 11*  ALKPHOS 50 56 50  BILITOT 0.8 0.9 0.8  PROT 5.7* 5.8* 5.6*  ALBUMIN 3.1* 3.3* 3.1*   No results for input(s): LIPASE, AMYLASE in the last 168 hours. No results for input(s): AMMONIA in the last 168 hours. Coagulation Profile: No results for input(s): INR, PROTIME in the last 168 hours. Cardiac Enzymes: No results for input(s): CKTOTAL, CKMB, CKMBINDEX, TROPONINI in the last 168 hours. BNP (last 3 results) No results for input(s): PROBNP in the last 8760 hours. HbA1C: Recent Labs    06/30/17 1010  HGBA1C 6.7*   CBG: Recent Labs  Lab 07/01/17 1202 07/01/17 1626 07/01/17 2043 07/02/17 0009 07/02/17 0405  GLUCAP 167* 246* 267* 145* 80   Lipid Profile: Recent Labs    06/30/17 1010  CHOL 139  HDL 67  LDLCALC 60  TRIG 59  CHOLHDL 2.1   Thyroid Function Tests: No results for input(s): TSH, T4TOTAL, FREET4, T3FREE, THYROIDAB in the last 72 hours. Anemia Panel: No results for input(s): VITAMINB12, FOLATE, FERRITIN, TIBC, IRON, RETICCTPCT in the last 72 hours. Sepsis Labs: No results for input(s): PROCALCITON, LATICACIDVEN in the last 168 hours.  Recent Results (from the past 240 hour(s))  Surgical pcr screen     Status: None   Collection Time: 06/29/17  1:36 AM  Result Value Ref Range Status   MRSA, PCR NEGATIVE NEGATIVE Final   Staphylococcus aureus NEGATIVE NEGATIVE Final    Comment: (NOTE) The Xpert SA Assay (FDA approved for NASAL specimens in patients 12 years of age and older), is one component of a comprehensive surveillance  program. It is not intended to diagnose infection nor to guide or monitor treatment.     Radiology Studies: Dg Elbow 2 Views Left  Result Date: 07/01/2017 CLINICAL DATA:  ORIF left elbow. EXAM: DG C-ARM 61-120 MIN; LEFT ELBOW - 2 VIEW COMPARISON:  06/28/2017. FINDINGS: ORIF left olecranon. Hardware intact. Anatomic alignment. No new abnormality identified. Prior ORIF proximal left humerus. IMPRESSION: ORIF left olecranon with anatomic alignment . Electronically Signed   By: Marcello Moores  Register   On: 07/01/2017 13:12   Dg Elbow 2 Views Left  Result Date: 07/01/2017 CLINICAL DATA:  Fracture of the proximal ulna. EXAM: LEFT ELBOW - 2 VIEW COMPARISON:  Radiographs dated 06/28/2017 FINDINGS: The patient has undergone open reduction and internal fixation of the proximal ulna fracture. Alignment and position of the fracture fragments is anatomic. Hardware appears in good position. Distal humerus and radius appear intact. IMPRESSION: Excellent alignment and position after open reduction and internal fixation of proximal ulna fracture. Electronically Signed   By: Lorriane Shire M.D.   On: 07/01/2017 10:18   Dg Pelvis Portable  Result Date: 07/01/2017 CLINICAL DATA:  Left hip pain secondary to recent fall. EXAM: PORTABLE PELVIS 1-2 VIEWS COMPARISON:  Radiographs dated 06/28/2017 FINDINGS: There is a comminuted nondisplaced fracture left greater trochanter. This appears unchanged. Pelvic bones are intact.  No dislocation of the left hip prosthesis. IMPRESSION: Stable appearance of the comminuted fracture of the left greater trochanter. Electronically Signed   By: Lorriane Shire M.D.   On: 07/01/2017 10:16   Dg C-arm 1-60 Min  Result Date: 07/01/2017 CLINICAL DATA:  ORIF left elbow. EXAM: DG C-ARM 61-120 MIN; LEFT ELBOW - 2 VIEW COMPARISON:  06/28/2017. FINDINGS: ORIF left olecranon. Hardware intact. Anatomic alignment. No new abnormality identified. Prior ORIF proximal left humerus. IMPRESSION: ORIF left  olecranon with anatomic alignment . Electronically Signed   By: Marcello Moores  Register   On: 07/01/2017 13:12   Scheduled Meds: . aspirin EC  81 mg Oral Daily  . enoxaparin (LOVENOX) injection  40 mg Subcutaneous Q24H  . escitalopram  10 mg Oral QHS  . guaiFENesin  1,200 mg Oral BID  . insulin aspart  0-15 Units Subcutaneous Q4H  . multivitamin with minerals  1 tablet Oral Daily  . simvastatin  10 mg Oral q1800   Continuous Infusions: .  ceFAZolin (ANCEF) IV Stopped (07/02/17 0132)    LOS: 4 days   Kerney Elbe, DO [facility] Pager [phone]  If 7PM-7AM, please contact night-coverage [email or web address] Password [facility] 07/02/2017, 8:19 AM

## 2017-07-03 LAB — COMPREHENSIVE METABOLIC PANEL
ALBUMIN: 2.6 g/dL — AB (ref 3.5–5.0)
ALK PHOS: 45 U/L (ref 38–126)
ALT: 6 U/L — ABNORMAL LOW (ref 14–54)
AST: 14 U/L — AB (ref 15–41)
Anion gap: 5 (ref 5–15)
BILIRUBIN TOTAL: 0.7 mg/dL (ref 0.3–1.2)
BUN: 24 mg/dL — AB (ref 6–20)
CALCIUM: 8.3 mg/dL — AB (ref 8.9–10.3)
CO2: 27 mmol/L (ref 22–32)
Chloride: 105 mmol/L (ref 101–111)
Creatinine, Ser: 0.81 mg/dL (ref 0.44–1.00)
GFR calc Af Amer: >60 mL/min (ref >60)
GFR calc non Af Amer: >60 mL/min (ref >60)
GLUCOSE: 153 mg/dL — AB (ref 65–99)
POTASSIUM: 4.4 mmol/L (ref 3.5–5.1)
SODIUM: 137 mmol/L (ref 135–145)
TOTAL PROTEIN: 5.2 g/dL — AB (ref 6.5–8.1)

## 2017-07-03 LAB — GLUCOSE, CAPILLARY
GLUCOSE-CAPILLARY: 132 mg/dL — AB (ref 65–99)
GLUCOSE-CAPILLARY: 201 mg/dL — AB (ref 65–99)
GLUCOSE-CAPILLARY: 173 mg/dL — AB (ref 65–99)
Glucose-Capillary: 138 mg/dL — ABNORMAL HIGH (ref 65–99)
Glucose-Capillary: 167 mg/dL — ABNORMAL HIGH (ref 65–99)
Glucose-Capillary: 192 mg/dL — ABNORMAL HIGH (ref 65–99)

## 2017-07-03 LAB — CBC WITH DIFFERENTIAL/PLATELET
BASOS ABS: 0 10*3/uL (ref 0.0–0.1)
BASOS PCT: 0 %
Eosinophils Absolute: 0.3 10*3/uL (ref 0.0–0.7)
Eosinophils Relative: 4 %
HEMATOCRIT: 26.1 % — AB (ref 36.0–46.0)
Hemoglobin: 8.2 g/dL — ABNORMAL LOW (ref 12.0–15.0)
Lymphocytes Relative: 19 %
Lymphs Abs: 1.1 10*3/uL (ref 0.7–4.0)
MCH: 29.2 pg (ref 26.0–34.0)
MCHC: 31.4 g/dL (ref 30.0–36.0)
MCV: 92.9 fL (ref 78.0–100.0)
Monocytes Absolute: 0.5 10*3/uL (ref 0.1–1.0)
Monocytes Relative: 8 %
NEUTROS ABS: 4 10*3/uL (ref 1.7–7.7)
Neutrophils Relative %: 69 %
Platelets: 172 10*3/uL (ref 150–400)
RBC: 2.81 MIL/uL — ABNORMAL LOW (ref 3.87–5.11)
RDW: 14.3 % (ref 11.5–15.5)
WBC: 5.8 10*3/uL (ref 4.0–10.5)

## 2017-07-03 LAB — MAGNESIUM: Magnesium: 2.1 mg/dL (ref 1.7–2.4)

## 2017-07-03 LAB — PHOSPHORUS: Phosphorus: 3.2 mg/dL (ref 2.5–4.6)

## 2017-07-03 NOTE — Progress Notes (Signed)
Left hand very edematous distal to applied ace wrap.  Ace removed and re-wrapped to maximize venous return.  Circulation unimpaired.  Radial pulses 4+ bilaterally.  Skin pink.  Left extremity elevated on pillows and ice applied.  Current pain medication regimen appears adequate to meet her needs at this time.

## 2017-07-03 NOTE — Progress Notes (Signed)
PROGRESS NOTE    Melanie Hughes  YTK:[id number] DOB: 1936-03-09 DOA: 06/28/2017 PCP: Alanda Amass, MD   Brief Narrative:  Melanie Hughes is a 81 y.o. female with medical history significant for Hypertension, Type 2 Diabetes Mellitus, History of Breast Cancer and other comorbids, now presenting to the emergency department with severe left hip and elbow pain after a ground-level mechanical fall.  She had been in her usual state of health and was having an uneventful day until she walked over an uneven surface, tripped, falling onto her left side, and experiencing immediate pain at the left elbow, hip, and periorbital region.  There was no loss of consciousness and she denies headache, vision change, nausea, vomiting, or focal numbness or weakness. She was brought to the ED  For evaluation and upon arrival to the ED, patient was found to be afebrile, saturating well on room air, and with stable blood pressure.  EKG featured a sinus rhythm, chest x-ray is negative for acute cardiopulmonary disease, and noncontrast head CT is negative for acute intracranial abnormality.    Radiographs of the left hip were suspicious for a nondisplaced fracture of the left greater trochanter adjacent to a bipolar density which remained intact.  Radiographs of the left elbow demonstrated an acute fracture of the left olecranon process with significant displacement of the olecranon fragment. Patient was treated with fentanyl in the ED and Orthopedic Surgery was consulted. Orthopedic surgery recommended medical admission to [facility] and patient underwent ORIF of Left Olecranon Fx on 07/01/17 by Dr. Doreatha Martin and is POD 2 and doing well. She is awaiting SNF Placement and currently has bed offer available but Insurance Authorization is pending.   Assessment & Plan:   Principal Problem:   Closed left hip fracture, initial encounter [facility]) Active Problems:   Diabetes mellitus without complication ([place])   Hypertension   Closed fracture of left olecranon process   Anxiety   Normocytic anemia   Hx of breast cancer   Hypomagnesemia   Hyperlipidemia  Closed Left Greater Trochanter Left Olecranon Fractures s/p ORIF POD2 - Presented with severe pain in left hip and left elbow after a ground-level mechanical fall at home - Radiographs demonstrate fractures involving left olecranon process and left greater trochanter; [place] CT negative  - She was unable to bear weight in ED  - Orthopedic Surgery consulted and appreciate additional Recc's; Patient underwent ORIF for Left Olecranon Fx on 07/01/17 and is POD 2 -Per Ortho Left Olecranon Fx has Non-Weight Bearing and No ROM Restrictions; Recommending daily dressing changes PRN -Per Ortho Left Greater Trochanter Fx does not need intervention and recommends non-operative treatment and can be WBAT with no active Abduction currently; Discussed with Orthopedics and ok to Abduct in 1 month  - Pt typically enjoys good health, is active and independent, and ascends a flight of stairs without difficulty prior to injury - Based on available data, [name] presents an estimated 0.3% risk probability for perioperative MI or cardiac arrest per Melburn Hake al; this is low-risk and no further testing recommended prior to surgery   - Analgesia and Pain control with Hydro-codone-Acetaminophen 1-2 tab po q6hprn Moderate Pain and with IV Morphine 0.5-1 mg q2hprn Severe Pain; Patient's pain is improved -Bowel Regimen with Senna-Docusate 1 tab po qHSprn for mild Constipation and Bisacodyl EC tablet 5 mg po Dailyprn Moderate Constipation  -Discussed VTE Prophylaxis with Orthopedics  started Lovenox 40 mg sq q24h for 3 weeks -PT/OT Recommending SNF; Social Work consulted for Placement; Currently  awaiting Insurance authorization.   Type II DM  -No A1c on file; Checked HbA1c and was 6.7 -Managed at home with Glipizide, Metformin, and Actos, held on admission  -C/w Moderate Novolog Insulin  SSI q4h -CBG's ranging from 132-201  Hypertension  -BP at goal at 122/57 -Held Home Lisinopril and HCTZ for now and can resume at D/C -Continue to use Hydralazine 10 mg IV q4hprn for SBP >165 or DBP >90  Depression and Anxiety  -Stable  -Continue Escitalopram 10 mg po qHS and Lorazepam 0.5 mg po q6hprn Anxiety/Sleep   Normocytic Anemia and ABLA in the setting of Surgery - Hgb 10.2 on admission with no prior CBC on record  - No bleeding is evident; Continue to Monitor for S/Sx of Bleeding -Hb/Hct went from 10.2/32.5 -> 8.2/26.1 -Transfuse if Hb/Hct < 7.0 -Expected Post Operative drop -Type and Screen done -Continue to Monitor for S/Sx of Bleeding -Repeat CBC in AM and continue to monitor closely   Hx of Breast Cancer  -Status-post remote mastectomy and chemotherapy  -Continues follow-up with no reoccurrence -Follow up with Oncology as an outpatient   Hypomagnesemia, improved -Patient's Mag Level was  2.1 -Continue to Monitor and Replete as Necessary -Repeat Mag Level in AM  HLD -Lipid Panel showed Cholesterol of 139, HDL of 67, LDL of 60, TG of 59, VLDL of 12 -C/w Simvastatin 10 mg po Daily  Mild AKI -BUN/Cr went from 25/0.74 -> 30/1.03; Now BUN/Cr is 24/0.81 -IVF now discontinued  -Avoid Nephrotoxic Medications  -Repeat CMP   DVT prophylaxis: SCDs; Enoxaparin 40 mg sq q24h Code Status: FULL CODE Family Communication: Discussed with Son at bedside Disposition Plan: SNF whenever bed is Available and Insurance Authorization is Approved.   Consultants:  Orthopedic Surgery Dr. Doreatha Martin  Procedures:  ORIF of Left Olecranon    Antimicrobials:  Anti-infectives (From admission, onward)   Start     Dose/Rate Route Frequency Ordered Stop   07/01/17 1600  ceFAZolin (ANCEF) IVPB 2g/100 mL premix     2 g 200 mL/hr over 30 Minutes Intravenous Every 8 hours 07/01/17 1051 07/02/17 0900   07/01/17 1101  vancomycin (VANCOCIN) powder  Status:  Discontinued       As needed  07/01/17 1102 07/01/17 1102   07/01/17 0600  ceFAZolin (ANCEF) IVPB 2g/100 mL premix     2 g 200 mL/hr over 30 Minutes Intravenous On call to O.R. 06/30/17 1246 07/01/17 0759     Subjective: Seen and examined at bedside and stated she slept the best last night she has been here. She had some pain in her elbow but it was adequately controlled by po medications. No CP or SOB. No other concerns or complaints at this time.   Objective: Vitals:   07/02/17 1230 07/02/17 1519 07/02/17 2006 07/03/17 0441  BP: 124/64 (!) 101/54 (!) 115/59 122/61  Pulse: 66 69 68 63  Resp: 16 16 16    Temp: 97.7 F (36.5 C) 98.8 F (37.1 C) 98.6 F (37 C) 98.1 F (36.7 C)  TempSrc: Oral Oral Oral Oral  SpO2: 100% 99% 99% 93%  Weight:      Height:        Intake/Output Summary (Last 24 hours) at 07/03/2017 8250 Last data filed at 07/02/2017 1900 Gross per 24 hour  Intake 480 ml  Output -  Net 480 ml   Filed Weights   06/28/17 1825  Weight: 61.2 kg (135 lb)   Examination: Physical Exam:  Constitutional: WN/WD pleasant Caucasian female in NAD  Eyes: has bruise on Left eye; Sclerae are icteric ENMT: External ears and nose appear normal. MMM Neck: Supple with no JVD Respiratory: Slightly diminished to auscultation but unlabored breathing. No appreciable wheezing/rales/rhonchi Cardiovascular: RRR; Has a slight 2/6 murmur. No appreciable LE edema Abdomen: Soft, NT, ND. Bowel sounds present  GU: Deferred Musculoskeletal: No contractures; No cyanosis Skin: Warm and Dry. Has bruise on Left eye. Left arm in ACE bandage but has some hand swelling.  Neurologic: CN 2-12 grossly intact. No appreciable focal deficits Psychiatric: Normal mood and affect. Intact judgement and insight  Data Reviewed: I have personally reviewed following labs and imaging studies  CBC: Recent Labs  Lab 06/29/17 0852 06/30/17 1010 07/01/17 0636 07/02/17 0734 07/03/17 0332  WBC 7.3 8.9 6.7 8.3 5.8  NEUTROABS 5.8 7.4 5.0  6.4 4.0  HGB 8.9* 9.9* 9.1* 8.5* 8.2*  HCT 27.8* 31.2* 28.8* 26.4* 26.1*  MCV 91.4 91.5 91.4 90.7 92.9  PLT 156 171 151 164 161   Basic Metabolic Panel: Recent Labs  Lab 06/29/17 0852 06/30/17 1010 07/01/17 0636 07/02/17 0734 07/03/17 0332  NA 140 138 138 136 137  K 3.7 3.7 4.2 4.1 4.4  CL 103 104 104 106 105  CO2 27 26 25 25 27   GLUCOSE 92 156* 158* 122* 153*  BUN 25* 30* 31* 23* 24*  CREATININE 0.74 1.03* 0.93 0.82 0.81  CALCIUM 8.4* 8.5* 8.6* 8.1* 8.3*  MG 1.5* 2.0 1.9 1.6* 2.1  PHOS 3.6 3.7 3.6 3.4 3.2   GFR: Estimated Creatinine Clearance: 54.4 mL/min (by C-G formula based on SCr of 0.81 mg/dL). Liver Function Tests: Recent Labs  Lab 06/29/17 0852 06/30/17 1010 07/01/17 0636 07/02/17 0734 07/03/17 0332  AST 16 16 21  14* 14*  ALT 11* 11* 11* 9* 6*  ALKPHOS 50 56 50 43 45  BILITOT 0.8 0.9 0.8 0.7 0.7  PROT 5.7* 5.8* 5.6* 5.0* 5.2*  ALBUMIN 3.1* 3.3* 3.1* 2.7* 2.6*   No results for input(s): LIPASE, AMYLASE in the last 168 hours. No results for input(s): AMMONIA in the last 168 hours. Coagulation Profile: No results for input(s): INR, PROTIME in the last 168 hours. Cardiac Enzymes: No results for input(s): CKTOTAL, CKMB, CKMBINDEX, TROPONINI in the last 168 hours. BNP (last 3 results) No results for input(s): PROBNP in the last 8760 hours. HbA1C: Recent Labs    06/30/17 1010  HGBA1C 6.7*   CBG: Recent Labs  Lab 07/02/17 1515 07/02/17 2004 07/03/17 0033 07/03/17 0447 07/03/17 0750  GLUCAP 250* 132* 192* 138* 132*   Lipid Profile: Recent Labs    06/30/17 1010  CHOL 139  HDL 67  LDLCALC 60  TRIG 59  CHOLHDL 2.1   Thyroid Function Tests: No results for input(s): TSH, T4TOTAL, FREET4, T3FREE, THYROIDAB in the last 72 hours. Anemia Panel: No results for input(s): VITAMINB12, FOLATE, FERRITIN, TIBC, IRON, RETICCTPCT in the last 72 hours. Sepsis Labs: No results for input(s): PROCALCITON, LATICACIDVEN in the last 168 hours.  Recent Results  (from the past 240 hour(s))  Surgical pcr screen     Status: None   Collection Time: 06/29/17  1:36 AM  Result Value Ref Range Status   MRSA, PCR NEGATIVE NEGATIVE Final   Staphylococcus aureus NEGATIVE NEGATIVE Final    Comment: (NOTE) The Xpert SA Assay (FDA approved for NASAL specimens in patients 49 years of age and older), is one component of a comprehensive surveillance program. It is not intended to diagnose infection nor to guide or monitor treatment.  Radiology Studies: Dg Elbow 2 Views Left  Result Date: 07/01/2017 CLINICAL DATA:  ORIF left elbow. EXAM: DG C-ARM 61-120 MIN; LEFT ELBOW - 2 VIEW COMPARISON:  06/28/2017. FINDINGS: ORIF left olecranon. Hardware intact. Anatomic alignment. No new abnormality identified. Prior ORIF proximal left humerus. IMPRESSION: ORIF left olecranon with anatomic alignment . Electronically Signed   By: Marcello Moores  Register   On: 07/01/2017 13:12   Dg Elbow 2 Views Left  Result Date: 07/01/2017 CLINICAL DATA:  Fracture of the proximal ulna. EXAM: LEFT ELBOW - 2 VIEW COMPARISON:  Radiographs dated 06/28/2017 FINDINGS: The patient has undergone open reduction and internal fixation of the proximal ulna fracture. Alignment and position of the fracture fragments is anatomic. Hardware appears in good position. Distal humerus and radius appear intact. IMPRESSION: Excellent alignment and position after open reduction and internal fixation of proximal ulna fracture. Electronically Signed   By: Lorriane Shire M.D.   On: 07/01/2017 10:18   Dg Pelvis Portable  Result Date: 07/01/2017 CLINICAL DATA:  Left hip pain secondary to recent fall. EXAM: PORTABLE PELVIS 1-2 VIEWS COMPARISON:  Radiographs dated 06/28/2017 FINDINGS: There is a comminuted nondisplaced fracture left greater trochanter. This appears unchanged. Pelvic bones are intact.  No dislocation of the left hip prosthesis. IMPRESSION: Stable appearance of the comminuted fracture of the left greater  trochanter. Electronically Signed   By: Lorriane Shire M.D.   On: 07/01/2017 10:16   Dg C-arm 1-60 Min  Result Date: 07/01/2017 CLINICAL DATA:  ORIF left elbow. EXAM: DG C-ARM 61-120 MIN; LEFT ELBOW - 2 VIEW COMPARISON:  06/28/2017. FINDINGS: ORIF left olecranon. Hardware intact. Anatomic alignment. No new abnormality identified. Prior ORIF proximal left humerus. IMPRESSION: ORIF left olecranon with anatomic alignment . Electronically Signed   By: Marcello Moores  Register   On: 07/01/2017 13:12   Scheduled Meds: . aspirin EC  81 mg Oral Daily  . enoxaparin (LOVENOX) injection  40 mg Subcutaneous Q24H  . escitalopram  10 mg Oral QHS  . guaiFENesin  1,200 mg Oral BID  . insulin aspart  0-15 Units Subcutaneous Q4H  . multivitamin with minerals  1 tablet Oral Daily  . simvastatin  10 mg Oral q1800   Continuous Infusions:   LOS: 5 days   Kerney Elbe, DO [facility] Pager [phone]  If 7PM-7AM, please contact night-coverage [email or web address] Password [email or web address] 07/03/2017, 8:21 AM

## 2017-07-04 DIAGNOSIS — I10 Essential (primary) hypertension: Secondary | ICD-10-CM

## 2017-07-04 DIAGNOSIS — E785 Hyperlipidemia, unspecified: Secondary | ICD-10-CM

## 2017-07-04 DIAGNOSIS — S72002A Fracture of unspecified part of neck of left femur, initial encounter for closed fracture: Secondary | ICD-10-CM

## 2017-07-04 DIAGNOSIS — Z853 Personal history of malignant neoplasm of breast: Secondary | ICD-10-CM

## 2017-07-04 DIAGNOSIS — D649 Anemia, unspecified: Secondary | ICD-10-CM

## 2017-07-04 LAB — CBC WITH DIFFERENTIAL/PLATELET
Basophils Absolute: 0 10*3/uL (ref 0.0–0.1)
Basophils Relative: 0 %
EOS PCT: 6 %
Eosinophils Absolute: 0.3 10*3/uL (ref 0.0–0.7)
HCT: 28.2 % — ABNORMAL LOW (ref 36.0–46.0)
Hemoglobin: 8.7 g/dL — ABNORMAL LOW (ref 12.0–15.0)
LYMPHS ABS: 0.9 10*3/uL (ref 0.7–4.0)
LYMPHS PCT: 17 %
MCH: 28.7 pg (ref 26.0–34.0)
MCHC: 30.9 g/dL (ref 30.0–36.0)
MCV: 93.1 fL (ref 78.0–100.0)
MONO ABS: 0.5 10*3/uL (ref 0.1–1.0)
MONOS PCT: 9 %
Neutro Abs: 3.5 10*3/uL (ref 1.7–7.7)
Neutrophils Relative %: 68 %
PLATELETS: 191 10*3/uL (ref 150–400)
RBC: 3.03 MIL/uL — ABNORMAL LOW (ref 3.87–5.11)
RDW: 14.3 % (ref 11.5–15.5)
WBC: 5.2 10*3/uL (ref 4.0–10.5)

## 2017-07-04 LAB — GLUCOSE, CAPILLARY
GLUCOSE-CAPILLARY: 116 mg/dL — AB (ref 65–99)
GLUCOSE-CAPILLARY: 122 mg/dL — AB (ref 65–99)
GLUCOSE-CAPILLARY: 156 mg/dL — AB (ref 65–99)
Glucose-Capillary: 141 mg/dL — ABNORMAL HIGH (ref 65–99)

## 2017-07-04 LAB — COMPREHENSIVE METABOLIC PANEL
ALT: 8 U/L — ABNORMAL LOW (ref 14–54)
AST: 15 U/L (ref 15–41)
Albumin: 2.6 g/dL — ABNORMAL LOW (ref 3.5–5.0)
Alkaline Phosphatase: 49 U/L (ref 38–126)
Anion gap: 7 (ref 5–15)
BILIRUBIN TOTAL: 0.6 mg/dL (ref 0.3–1.2)
BUN: 24 mg/dL — ABNORMAL HIGH (ref 6–20)
CHLORIDE: 103 mmol/L (ref 101–111)
CO2: 27 mmol/L (ref 22–32)
CREATININE: 0.79 mg/dL (ref 0.44–1.00)
Calcium: 8.4 mg/dL — ABNORMAL LOW (ref 8.9–10.3)
Glucose, Bld: 130 mg/dL — ABNORMAL HIGH (ref 65–99)
POTASSIUM: 4.5 mmol/L (ref 3.5–5.1)
Sodium: 137 mmol/L (ref 135–145)
TOTAL PROTEIN: 5.2 g/dL — AB (ref 6.5–8.1)

## 2017-07-04 LAB — PHOSPHORUS: PHOSPHORUS: 3.3 mg/dL (ref 2.5–4.6)

## 2017-07-04 LAB — MAGNESIUM: MAGNESIUM: 1.8 mg/dL (ref 1.7–2.4)

## 2017-07-04 MED ORDER — METFORMIN HCL 1000 MG PO TABS: 1000.0000 mg | ORAL_TABLET | Freq: Two times a day (BID) | ORAL | Status: DC

## 2017-07-04 MED ORDER — BISACODYL 5 MG PO TBEC
5.0000 mg | DELAYED_RELEASE_TABLET | Freq: Every day | ORAL | 0 refills | Status: AC | PRN
Start: 2017-07-04 — End: ?

## 2017-07-04 MED ORDER — LORAZEPAM 0.5 MG PO TABS: 0.5000 mg | ORAL_TABLET | Freq: Every day | ORAL | 0 refills | Status: DC

## 2017-07-04 MED ORDER — ENOXAPARIN SODIUM 40 MG/0.4ML ~~LOC~~ SOLN: 40.0000 mg | SUBCUTANEOUS | Status: DC

## 2017-07-04 MED ORDER — HYDROCODONE-ACETAMINOPHEN 5-325 MG PO TABS: 1.0000 | ORAL_TABLET | Freq: Four times a day (QID) | ORAL | 0 refills | Status: DC | PRN

## 2017-07-04 NOTE — Progress Notes (Signed)
Occupational Therapy Treatment Patient Details Name: Melanie Hughes MRN: [id number] DOB: 09-23-1935 Today's Date: 07/04/2017    History of present illness 81 year old female with fall and nondisplaced left greater trochanteric fracture around arthroplasty.  She also has a displaced left olecranon fracture, now s/p ORIF 12/28. Pertinent PMH includes: DM, HTN, Breast CA, L TKA   OT comments  Pt making progress with functional mobility and ADL performance. She performed toileting tasks with min assist. Requires assist to steer RW due to left UE deficits. Continue to recommend SNF for discharge planning. Will continue to follow acutely to address deficits listed below.   Follow Up Recommendations  SNF    Equipment Recommendations  None recommended by OT    Recommendations for Other Services      Precautions / Restrictions Precautions Precautions: Fall Precaution Comments: hx of falls, discussed no LLE abduction ROM and NWB LUE Restrictions Weight Bearing Restrictions: Yes LUE Weight Bearing: Non weight bearing LLE Weight Bearing: Weight bearing as tolerated Other Position/Activity Restrictions: no hip abduction against res       Mobility Bed Mobility Overal bed mobility: Needs Assistance Bed Mobility: Supine to Sit     Supine to sit: Supervision     General bed mobility comments: Supervision for safety. Use of rail and HOB elevated  Transfers Overall transfer level: Needs assistance Equipment used: Rolling walker (2 wheeled) Transfers: Sit to/from [facility] Sit to Stand: Min assist Stand pivot transfers: Min assist       General transfer comment: Assist to power up and to steer walker with pivot. Cues for hand placement.     Balance                                           ADL either performed or assessed with clinical judgement   ADL                           Toilet Transfer: Minimal  assistance;Stand-pivot;BSC;RW   Toileting- Clothing Manipulation and Hygiene: Minimal assistance;Sit to/from stand       Functional mobility during ADLs: Minimal assistance;Rolling walker       Vision       Perception     Praxis      Cognition Arousal/Alertness: Awake/alert Behavior During Therapy: WFL for tasks assessed/performed Overall Cognitive Status: Within Functional Limits for tasks assessed                                          Exercises     Shoulder Instructions       General Comments      Pertinent Vitals/ Pain       Pain Assessment: 0-10 Pain Score: 4  Pain Location: L arm Pain Descriptors / Indicators: Sore  Home Living   Living Arrangements: Alone                                      Prior Functioning/Environment              Frequency  Min 2X/week        Progress Toward Goals  OT Goals(current goals can now be found  in the care plan section)  Progress towards OT goals: Progressing toward goals  Acute Rehab OT Goals Patient Stated Goal: return home but open to short term SNF first. OT Goal Formulation: With patient/family Time For Goal Achievement: 07/14/17 Potential to Achieve Goals: Good ADL Goals Pt Will Perform Grooming: with supervision;with set-up;sitting Pt Will Perform Upper Body Bathing: with min assist;sitting Pt Will Perform Lower Body Bathing: with min assist;sitting/lateral leans Pt Will Perform Upper Body Dressing: with min assist;sitting Pt Will Transfer to Toilet: with min assist;with min guard assist;bedside commode Pt Will Perform Toileting - Clothing Manipulation and hygiene: with mod assist  Plan Discharge plan remains appropriate    Co-evaluation                 AM-PAC PT "6 Clicks" Daily Activity     Outcome Measure   Help from another person eating meals?: None Help from another person taking care of personal grooming?: A Little Help from another person  toileting, which includes using toliet, bedpan, or urinal?: A Lot Help from another person bathing (including washing, rinsing, drying)?: A Lot Help from another person to put on and taking off regular upper body clothing?: A Lot Help from another person to put on and taking off regular lower body clothing?: A Lot 6 Click Score: 15    End of Session Equipment Utilized During Treatment: Gait belt;Rolling walker  OT Visit Diagnosis: History of falling (Z91.81);Unsteadiness on feet (R26.81);Muscle weakness (generalized) (M62.81);Pain Pain - Right/Left: Left Pain - part of body: Arm;Hip   Activity Tolerance Patient tolerated treatment well   Patient Left in chair;with call bell/phone within reach;with family/visitor present   Nurse Communication Mobility status(purewick removed during transfers)        Time: 1140-1154 OT Time Calculation (min): 14 min  Charges: OT General Charges $OT Visit: 1 Visit OT Treatments $Self Care/Home Management : 8-22 mins     Darrol Jump OTR/L 07/04/2017, 12:00 PM

## 2017-07-04 NOTE — Progress Notes (Signed)
Spoke with Dr. Wynelle Cleveland concerning patients increased pain due to increased movement since ace wrap removed.  Requesting if Norco can be changed from q6 to q4.  Dr. Wynelle Cleveland stated that due to her age she did not wish to switch it at this time and to use other comfort measures, such as ice, to help control pain.  No other orders given at this time. Informed patient of Dr. Reggy Eye answer and concern.

## 2017-07-04 NOTE — Progress Notes (Signed)
Orthopaedic Trauma Progress Note  S: Doing well, pain controlled. Wants to get out of hospital  O:  Vitals:   07/03/17 2005 07/04/17 0410  BP: (!) 107/54 127/67  Pulse: 75 64  Resp: 16 13  Temp: 98.3 F (36.8 C) 97.7 F (36.5 C)  SpO2: 95% 96%   Gen: NAD LUE: Incision clean, dry and intact. Elbow ROM without pain. Neurovascularly intact QBV:QXIHWT ROM without pain, neurovascularly intact  CBC    Component Value Date/Time   WBC 5.2 07/04/2017 0631   RBC 3.03 (L) 07/04/2017 0631   HGB 8.7 (L) 07/04/2017 0631   HCT 28.2 (L) 07/04/2017 0631   PLT 191 07/04/2017 0631   MCV 93.1 07/04/2017 0631   MCH 28.7 07/04/2017 0631   MCHC 30.9 07/04/2017 0631   RDW 14.3 07/04/2017 0631   LYMPHSABS 0.9 07/04/2017 0631   MONOABS 0.5 07/04/2017 0631   EOSABS 0.3 07/04/2017 0631   BASOSABS 0.0 07/04/2017 06334    A/P: 81 year old female s/p ORIF left olecranon and nonop left greater trochanter  -WBAT LLE, NWB LUE -No active abduction for 1 month -PT/OT -Pain control -Return to see me in 2 weeks with repeat x-rays.  Shona Needles, MD [facility] [phone] (phone)

## 2017-07-04 NOTE — Progress Notes (Signed)
CSW following to facilitate discharge planning. CSW received insurance authorization from [facility] for patient to admit to SNF. CSW alerted admissions representative at selected facility, [facility], and admissions rep indicated that discharge summary will need done as soon as possible. CSW alerted MD.  CSW received call shortly after from admissions at [facility] that facility would have needed summary by noon for admission today; pharmacy is closing early today and will be closed tomorrow, so the facility will not be able to get the patient's medications. CSW asked about sending medications from the hospital with the patient until the facility could get meds filled; admissions representative indicated that they would not be able to do that.   CSW alerted MD. Patient has no other bed offers at this time. CSW will continue to follow.  Laveda Abbe, [place] Clinical Social Worker [phone]

## 2017-07-04 NOTE — Progress Notes (Signed)
Report called to Ander Purpura, LPN at [facility]

## 2017-07-04 NOTE — Progress Notes (Signed)
[facility] at bedside to transport patient to [facility]

## 2017-07-04 NOTE — Clinical Social Work Placement (Signed)
Nurse to call report to [employer]  NOTE  Date:  07/04/2017  Patient Details  Name: Melanie Hughes MRN: [id number] Date of Birth: Oct 24, 1935  Clinical Social Work is seeking post-discharge placement for this patient at the [place] level of care (*CSW will initial, date and re-position this form in  chart as items are completed):  Yes   Patient/family provided with [place] Work Department's list of facilities offering this level of care within the geographic area requested by the patient (or if unable, by the patient's family).  Yes   Patient/family informed of their freedom to choose among providers that offer the needed level of care, that participate in Medicare, Medicaid or managed care program needed by the patient, have an available bed and are willing to accept the patient.  Yes   Patient/family informed of [facility]'s ownership interest in [facility] and [facility], as well as of the fact that they are under no obligation to receive care at these facilities.  PASRR submitted to EDS on 07/01/17     PASRR number received on       Existing PASRR number confirmed on       FL2 transmitted to all facilities in geographic area requested by pt/family on 07/01/17     FL2 transmitted to all facilities within larger geographic area on       Patient informed that his/her managed care company has contracts with or will negotiate with certain facilities, including the following:        Yes   Patient/family informed of bed offers received.  Patient chooses bed at [facility]     Physician recommends and patient chooses bed at      Patient to be transferred to [facility] on 07/04/17.  Patient to be transferred to facility by PTAR     Patient family notified on 07/04/17 of transfer.  Name of family member notified:        PHYSICIAN       Additional  Comment:    _______________________________________________ Geralynn Ochs, LCSW 07/04/2017, 1:36 PM

## 2017-07-04 NOTE — Care Management Important Message (Signed)
Important Message  Patient Details  Name: Melanie Hughes MRN: [id number] Date of Birth: 1935/12/07   Medicare Important Message Given:  Yes    Marinus Eicher 07/04/2017, 2:04 PM

## 2017-07-04 NOTE — Progress Notes (Signed)
Physical Therapy Treatment Patient Details Name: Melanie Hughes MRN: [id number] DOB: 25-Aug-1935 Today's Date: 07/04/2017    History of Present Illness 81 year old female with fall and nondisplaced left greater trochanteric fracture around arthroplasty.  She also has a displaced left olecranon fracture, now s/p ORIF 12/28. Pertinent PMH includes: DM, HTN, Breast CA, L TKA    PT Comments    Pt was able to progress gait with AD to the hallway with one person assist.  Pt is limited by pain and soreness of both the left leg and her arm.  We also practiced in bed HEP avoiding hip abduction exercises (see precautions).  Pt is due to d/c to SNF for rehab today.   Follow Up Recommendations  SNF;Supervision/Assistance - 24 hour     Equipment Recommendations  None recommended by PT    Recommendations for Other Services   NA     Precautions / Restrictions Precautions Precautions: Fall Precaution Comments: hx of falls, discussed no LLE abduction ROM and NWB LUE Restrictions LUE Weight Bearing: Non weight bearing LLE Weight Bearing: Weight bearing as tolerated Other Position/Activity Restrictions: no hip abduction against res    Mobility  Bed Mobility Overal bed mobility: Needs Assistance Bed Mobility: Supine to Sit;Sit to Supine     Supine to sit: Min assist;HOB elevated Sit to supine: Mod assist   General bed mobility comments: Min assist to support trunk during transition to sit from elevated HOB.  Pt using right arm to pull n bed rail.  Mod assist to help lift both legs together to help get back into the bed.   Transfers Overall transfer level: Needs assistance Equipment used: Rolling walker (2 wheeled) Transfers: Sit to/from [facility] Sit to Stand: Min assist Stand pivot transfers: Min assist       General transfer comment: Min assist to help power up and stabilize RW during transitions.  Pt self corrected her hand placement.  Sling donned for her trip in  the ambulance that is happening later today.   Ambulation/Gait Ambulation/Gait assistance: Min assist Ambulation Distance (Feet): (15) Assistive device: Rolling walker (2 wheeled) Gait Pattern/deviations: Step-to pattern;Antalgic Gait velocity: decreased Gait velocity interpretation: Below normal speed for age/gender General Gait Details: Pt able to make it just out into the hallway with min assist for balance and to help steer the RW which she has been using with one hand.            Cognition Arousal/Alertness: Awake/alert Behavior During Therapy: WFL for tasks assessed/performed Overall Cognitive Status: Within Functional Limits for tasks assessed                                        Exercises General Exercises - Lower Extremity Ankle Circles/Pumps: AROM;Both;20 reps Quad Sets: AROM;Both;10 reps Long Arc Quad: AROM;Both;10 reps Heel Slides: AAROM;Left;10 reps        Pertinent Vitals/Pain Pain Assessment: 0-10 Pain Score: 6  Pain Location: L arm Pain Descriptors / Indicators: Sore Pain Intervention(s): Limited activity within patient's tolerance;Monitored during session;Repositioned           PT Goals (current goals can now be found in the care plan section) Acute Rehab PT Goals Patient Stated Goal: return home but open to short term SNF first. Progress towards PT goals: Progressing toward goals    Frequency    Min 3X/week      PT Plan Current  plan remains appropriate       AM-PAC PT "6 Clicks" Daily Activity  Outcome Measure  Difficulty turning over in bed (including adjusting bedclothes, sheets and blankets)?: Unable Difficulty moving from lying on back to sitting on the side of the bed? : Unable Difficulty sitting down on and standing up from a chair with arms (e.g., wheelchair, bedside commode, etc,.)?: Unable Help needed moving to and from a bed to chair (including a wheelchair)?: A Little Help needed walking in hospital room?:  A Little Help needed climbing 3-5 steps with a railing? : Total 6 Click Score: 10    End of Session Equipment Utilized During Treatment: Gait belt Activity Tolerance: Patient limited by fatigue;Patient limited by pain Patient left: in bed;with call bell/phone within reach;with family/visitor present   PT Visit Diagnosis: Unsteadiness on feet (R26.81);Other abnormalities of gait and mobility (R26.89);History of falling (Z91.81);Pain Pain - Right/Left: Left Pain - part of body: Arm;Hip     Time: 1735-6701 PT Time Calculation (min) (ACUTE ONLY): 17 min  Charges:  $Gait Training: 8-22 mins          Melanie Hughes, PT, DPT [phone]             07/04/2017, 5:02 PM

## 2017-07-04 NOTE — Discharge Summary (Signed)
Physician Discharge Summary  Melanie Hughes ERX:[id number] DOB: 05-03-1936 DOA: 06/28/2017  PCP: Alanda Amass, MD  Admit date: 06/28/2017 Discharge date: 07/04/2017  Admitted From: home Disposition:  SNF   Recommendations for Outpatient Follow-up:  1. Follow BP and resume home meds as needed 2. Follow CBGs TID AC/HS and resume Glipizide if needed  Discharge Condition:  stable   CODE STATUS:  Full code   Consultations:  ortho    Discharge Diagnoses:  Principal Problem:   Closed left hip fracture, initial encounter [facility]) Active Problems:   Diabetes mellitus without complication ([place])   Hypertension   Closed fracture of left olecranon process   Anxiety   Normocytic anemia   Hx of breast cancer   Hypomagnesemia   Hyperlipidemia    Subjective: Minor pain in left elbow. No other complaints.   Brief Summary: Melanie Hughes a 81 y.o.femalewith medical history significant forHypertension, Type 2 Diabetes Mellitus, History of Breast Cancer and other comorbids, now presenting to the emergency department with severe left hip and elbow pain after a ground-level mechanical fall. She had been in her usual state of health and was having an uneventful day until she walked over an uneven surface, tripped, falling onto her left side, and experiencing immediate pain at the left elbow, hip, and periorbital region. There was no loss of consciousness and she denies headache, vision change, nausea, vomiting, or focal numbness or weakness. She was brought to the ED  For evaluation and upon arrival to the ED, patient was found to be afebrile, saturating well on room air, and with stable blood pressure. EKG featured a sinus rhythm, chest x-ray is negative for acute cardiopulmonary disease, and noncontrast head CT is negative for acute intracranial abnormality.   Radiographs of the left hip were suspicious for a nondisplaced fracture of the left greater trochanter adjacent to a bipolar  density which remained intact. Radiographs of the left elbow demonstrated an acute fracture of the left olecranon process with significant displacement of the olecranon fragment. Patient was treated with fentanyl in the ED and Orthopedic Surgery was consulted. Orthopedic surgery recommended medical admission to [facility] and patient underwent ORIF of Left Olecranon Fx on 07/01/17 by Dr. Doreatha Martin and is POD 2 and doing well. She is awaiting SNF Placement and currently has bed offer available but Insurance Authorization is pending.     Hospital Course:  Closed Left Greater Trochanter Left Olecranon Fractures s/p ORIF POD2 -  Patient underwent ORIF for Left Olecranon Fx on 07/01/17  -Per Ortho Left Olecranon Fx has Non-Weight Bearing and No ROM Restrictions; Recommending daily dressing changes PRN -Per Ortho Left Greater Trochanter Fx does not need intervention and recommends non-operative treatment and can be WBAT with no active Abduction currently; Discussed with Orthopedics and ok to Abduct in 1 month  -Dr Alfredia Ferguson discussed VTE Prophylaxis with Orthopedics and started Lovenox 40 mg sq q24h for 3 weeks -PT/OT Recommending SNF    Type II DM -No A1c on file; Checked HbA1c and was 6.7 -Managed at home with Glipizide, Metformin, and Actos - will resume Actos and Metformin- follow sugars closely please      Hypertension -BP at goalat 122/57 -BP still not elevated and therefore home meds not yet resumed  Depression and Anxiety -Stable -Continue Escitalopram 10 mg po qHS and Lorazepam 0.5 mg po q6hprn Anxiety/Sleep  Normocytic Anemia and ABLA in the setting of Surgery -Hgb 10.2 on admission with no prior CBC on record - since admission her Hb has  dropped to 8-9 range and has been stable in this range  Hx of Breast Cancer  -Status-post remote mastectomy and chemotherapy -Continues follow-up with no reoccurrence -Follow up with Oncology as an outpatient    Hypomagnesemia, improved - Mg+ level stable today  HLD -Lipid Panel showed Cholesterol of 139, HDL of 67, LDL of 60, TG of 59, VLDL of 12 -C/w Simvastatin 10 mg po Daily  Mild AKI -BUN/Cr went from 25/0.74 -> 30/1.03- and improved after IVF- see labs below  Discharge Exam: Vitals:   07/03/17 2005 07/04/17 0410  BP: (!) 107/54 127/67  Pulse: 75 64  Resp: 16 13  Temp: 98.3 F (36.8 C) 97.7 F (36.5 C)  SpO2: 95% 96%   Vitals:   07/03/17 0441 07/03/17 1308 07/03/17 2005 07/04/17 0410  BP: 122/61 (!) 122/57 (!) 107/54 127/67  Pulse: 63 70 75 64  Resp:   16 13  Temp: 98.1 F (36.7 C) (!) 97 F (36.1 C) 98.3 F (36.8 C) 97.7 F (36.5 C)  TempSrc: Oral Oral Oral Oral  SpO2: 93% 98% 95% 96%  Weight:      Height:        General: Pt is alert, awake, not in acute distress Cardiovascular: RRR, S1/S2 +, no rubs, no gallops Respiratory: CTA bilaterally, no wheezing, no rhonchi Abdominal: Soft, NT, ND, bowel sounds + Extremities: no edema, no cyanosis   Discharge Instructions  Discharge Instructions    Diet - low sodium heart healthy   Complete by:  As directed    Diet Carb Modified   Complete by:  As directed    Increase activity slowly   Complete by:  As directed      Allergies as of 07/04/2017      Reactions   Penicillins Itching      Medication List    STOP taking these medications   glipiZIDE 10 MG tablet Commonly known as:  GLUCOTROL   lisinopril-hydrochlorothiazide 20-12.5 MG tablet Commonly known as:  PRINZIDE,ZESTORETIC     TAKE these medications   aspirin EC 81 MG tablet Take 81 mg by mouth daily.   bisacodyl 5 MG EC tablet Commonly known as:  DULCOLAX Take 1 tablet (5 mg total) by mouth daily as needed for moderate constipation.   enoxaparin 40 MG/0.4ML injection Commonly known as:  LOVENOX Inject 0.4 mLs (40 mg total) into the skin daily.   escitalopram 10 MG tablet Commonly known as:  LEXAPRO Take 10 mg by mouth at bedtime.    HYDROcodone-acetaminophen 5-325 MG tablet Commonly known as:  NORCO/VICODIN Take 1-2 tablets by mouth every 6 (six) hours as needed for moderate pain.   LORazepam 0.5 MG tablet Commonly known as:  ATIVAN Take 1 tablet (0.5 mg total) by mouth daily.   metFORMIN 1000 MG tablet Commonly known as:  GLUCOPHAGE Take 1 tablet (1,000 mg total) by mouth 2 (two) times daily.   multivitamin with minerals Tabs tablet Take 1 tablet by mouth daily.   pioglitazone 30 MG tablet Commonly known as:  ACTOS Take 30 mg by mouth daily.   PRESERVISION AREDS 2+MULTI VIT Caps Take 2 capsules by mouth daily.   simvastatin 10 MG tablet Commonly known as:  ZOCOR Take 10 mg by mouth daily.      Contact information for after-discharge care    [facility] Follow up.   Service:  Skilled Chiropodist information: [address] [place] [phone]  Allergies  Allergen Reactions  . Penicillins Itching     Procedures/Studies:  ORIF of Left Olecranon     Dg Elbow 2 Views Left  Result Date: 07/01/2017 CLINICAL DATA:  ORIF left elbow. EXAM: DG C-ARM 61-120 MIN; LEFT ELBOW - 2 VIEW COMPARISON:  06/28/2017. FINDINGS: ORIF left olecranon. Hardware intact. Anatomic alignment. No new abnormality identified. Prior ORIF proximal left humerus. IMPRESSION: ORIF left olecranon with anatomic alignment . Electronically Signed   By: Marcello Moores  Register   On: 07/01/2017 13:12   Dg Elbow 2 Views Left  Result Date: 07/01/2017 CLINICAL DATA:  Fracture of the proximal ulna. EXAM: LEFT ELBOW - 2 VIEW COMPARISON:  Radiographs dated 06/28/2017 FINDINGS: The patient has undergone open reduction and internal fixation of the proximal ulna fracture. Alignment and position of the fracture fragments is anatomic. Hardware appears in good position. Distal humerus and radius appear intact. IMPRESSION: Excellent alignment and position after open  reduction and internal fixation of proximal ulna fracture. Electronically Signed   By: Lorriane Shire M.D.   On: 07/01/2017 10:18   Dg Elbow Complete Left (3+view)  Result Date: 06/28/2017 CLINICAL DATA:  Fall with left hip and elbow pain. Initial encounter. EXAM: LEFT ELBOW - COMPLETE 3+ VIEW COMPARISON:  None. FINDINGS: Acute fracture of the olecranon process of the proximal left ulna present which is displaced significantly. The olecranon fragment is rotated and posteriorly displaced by nearly 2 cm. Associated significant overlying soft tissue swelling and joint effusion present. No dislocation. No other fractures are identified. IMPRESSION: Acute fracture of the olecranon process of the proximal left ulna with significant displacement of the olecranon fragment. Electronically Signed   By: Aletta Edouard M.D.   On: 06/28/2017 19:26   Ct Head Wo Contrast  Result Date: 06/28/2017 CLINICAL DATA:  Status post.  Left-sided EXAM: CT HEAD WITHOUT CONTRAST TECHNIQUE: Contiguous axial images were obtained from the base of the skull through the vertex without intravenous contrast. COMPARISON:  None. FINDINGS: Brain: No evidence of acute infarction, hemorrhage, extra-axial collection, ventriculomegaly, or mass effect. Generalized cerebral atrophy. Periventricular white matter low attenuation likely secondary to microangiopathy. Vascular: Cerebrovascular atherosclerotic calcifications are noted. Skull: Negative for fracture or focal lesion. Sinuses/Orbits: Visualized portions of the orbits are unremarkable. Visualized portions of the paranasal sinuses and mastoid air cells are unremarkable. Other: None. IMPRESSION: No acute intracranial pathology. Electronically Signed   By: Kathreen Devoid   On: 06/28/2017 19:13   Dg Pelvis Portable  Result Date: 07/01/2017 CLINICAL DATA:  Left hip pain secondary to recent fall. EXAM: PORTABLE PELVIS 1-2 VIEWS COMPARISON:  Radiographs dated 06/28/2017 FINDINGS: There is a  comminuted nondisplaced fracture left greater trochanter. This appears unchanged. Pelvic bones are intact.  No dislocation of the left hip prosthesis. IMPRESSION: Stable appearance of the comminuted fracture of the left greater trochanter. Electronically Signed   By: Lorriane Shire M.D.   On: 07/01/2017 10:16   Dg Chest Port 1 View  Result Date: 06/28/2017 CLINICAL DATA:  Hip fracture. EXAM: PORTABLE CHEST 1 VIEW COMPARISON:  None. FINDINGS: Mild cardiomegaly is noted. No acute pulmonary disease is noted. No pneumothorax or pleural effusion is noted. Status post left shoulder arthroplasty. IMPRESSION: No acute cardiopulmonary abnormality seen. Electronically Signed   By: Marijo Conception, M.D.   On: 06/28/2017 21:19   Dg C-arm 1-60 Min  Result Date: 07/01/2017 CLINICAL DATA:  ORIF left elbow. EXAM: DG C-ARM 61-120 MIN; LEFT ELBOW - 2 VIEW COMPARISON:  06/28/2017. FINDINGS: ORIF left olecranon. Hardware  intact. Anatomic alignment. No new abnormality identified. Prior ORIF proximal left humerus. IMPRESSION: ORIF left olecranon with anatomic alignment . Electronically Signed   By: Marcello Moores  Register   On: 07/01/2017 13:12   Dg Hip Unilat W Or Wo Pelvis 2-3 Views Left  Result Date: 06/28/2017 CLINICAL DATA:  Fall with left hip and elbow pain. Initial encounter. EXAM: DG HIP (WITH OR WITHOUT PELVIS) 2-3V LEFT COMPARISON:  None. FINDINGS: Bipolar arthroplasty of the left hip shows normal alignment. There is a probable nondisplaced fracture extending through the adjacent greater trochanter. The bony pelvis appears intact without fracture or diastasis. IMPRESSION: Suspect nondisplaced fracture of the left greater trochanter adjacent to an indwelling bipolar arthroplasty which shows normal alignment. Electronically Signed   By: Aletta Edouard M.D.   On: 06/28/2017 19:24     The results of significant diagnostics from this hospitalization (including imaging, microbiology, ancillary and laboratory) are listed  below for reference.     Microbiology: Recent Results (from the past 240 hour(s))  Surgical pcr screen     Status: None   Collection Time: 06/29/17  1:36 AM  Result Value Ref Range Status   MRSA, PCR NEGATIVE NEGATIVE Final   Staphylococcus aureus NEGATIVE NEGATIVE Final    Comment: (NOTE) The Xpert SA Assay (FDA approved for NASAL specimens in patients 54 years of age and older), is one component of a comprehensive surveillance program. It is not intended to diagnose infection nor to guide or monitor treatment.      Labs: BNP (last 3 results) No results for input(s): BNP in the last 8760 hours. Basic Metabolic Panel: Recent Labs  Lab 06/30/17 1010 07/01/17 0636 07/02/17 0734 07/03/17 0332 07/04/17 0631  NA 138 138 136 137 137  K 3.7 4.2 4.1 4.4 4.5  CL 104 104 106 105 103  CO2 26 25 25 27 27   GLUCOSE 156* 158* 122* 153* 130*  BUN 30* 31* 23* 24* 24*  CREATININE 1.03* 0.93 0.82 0.81 0.79  CALCIUM 8.5* 8.6* 8.1* 8.3* 8.4*  MG 2.0 1.9 1.6* 2.1 1.8  PHOS 3.7 3.6 3.4 3.2 3.3   Liver Function Tests: Recent Labs  Lab 06/30/17 1010 07/01/17 0636 07/02/17 0734 07/03/17 0332 07/04/17 0631  AST 16 21 14* 14* 15  ALT 11* 11* 9* 6* 8*  ALKPHOS 56 50 43 45 49  BILITOT 0.9 0.8 0.7 0.7 0.6  PROT 5.8* 5.6* 5.0* 5.2* 5.2*  ALBUMIN 3.3* 3.1* 2.7* 2.6* 2.6*   No results for input(s): LIPASE, AMYLASE in the last 168 hours. No results for input(s): AMMONIA in the last 168 hours. CBC: Recent Labs  Lab 06/30/17 1010 07/01/17 0636 07/02/17 0734 07/03/17 0332 07/04/17 0631  WBC 8.9 6.7 8.3 5.8 5.2  NEUTROABS 7.4 5.0 6.4 4.0 3.5  HGB 9.9* 9.1* 8.5* 8.2* 8.7*  HCT 31.2* 28.8* 26.4* 26.1* 28.2*  MCV 91.5 91.4 90.7 92.9 93.1  PLT 171 151 164 172 191   Cardiac Enzymes: No results for input(s): CKTOTAL, CKMB, CKMBINDEX, TROPONINI in the last 168 hours. BNP: Invalid input(s): POCBNP CBG: Recent Labs  Lab 07/03/17 2003 07/04/17 0023 07/04/17 0408 07/04/17 0839  07/04/17 1222  GLUCAP 173* 122* 116* 141* 156*   D-Dimer No results for input(s): DDIMER in the last 72 hours. Hgb A1c No results for input(s): HGBA1C in the last 72 hours. Lipid Profile No results for input(s): CHOL, HDL, LDLCALC, TRIG, CHOLHDL, LDLDIRECT in the last 72 hours. Thyroid function studies No results for input(s): TSH, T4TOTAL, T3FREE, THYROIDAB in  the last 72 hours.  Invalid input(s): FREET3 Anemia work up No results for input(s): VITAMINB12, FOLATE, FERRITIN, TIBC, IRON, RETICCTPCT in the last 72 hours. Urinalysis    Component Value Date/Time   COLORURINE YELLOW 06/28/2017 2046   APPEARANCEUR HAZY (A) 06/28/2017 2046   LABSPEC 1.020 06/28/2017 2046   PHURINE 5.0 06/28/2017 2046   GLUCOSEU >=500 (A) 06/28/2017 2046   HGBUR NEGATIVE 06/28/2017 2046   BILIRUBINUR NEGATIVE 06/28/2017 2046   KETONESUR NEGATIVE 06/28/2017 2046   PROTEINUR NEGATIVE 06/28/2017 2046   NITRITE NEGATIVE 06/28/2017 2046   LEUKOCYTESUR MODERATE (A) 06/28/2017 2046   Sepsis Labs Invalid input(s): PROCALCITONIN,  WBC,  LACTICIDVEN Microbiology Recent Results (from the past 240 hour(s))  Surgical pcr screen     Status: None   Collection Time: 06/29/17  1:36 AM  Result Value Ref Range Status   MRSA, PCR NEGATIVE NEGATIVE Final   Staphylococcus aureus NEGATIVE NEGATIVE Final    Comment: (NOTE) The Xpert SA Assay (FDA approved for NASAL specimens in patients 52 years of age and older), is one component of a comprehensive surveillance program. It is not intended to diagnose infection nor to guide or monitor treatment.      Time coordinating discharge: Over 30 minutes  SIGNED:   Debbe Odea, MD  [facility] 07/04/2017, 12:52 PM Pager   If 7PM-7AM, please contact night-coverage [email or web address] Password [email or web address]

## 2017-08-18 NOTE — Progress Notes (Signed)
[name] [place] [address] [place], [place] [address] Phone: [phone] Subjective:     CC: Bilateral hip pain  [id number]  Melanie Hughes is a 82 y.o. female coming in with complaint of bilateral hip pain.  Patient did have a fall on June 28, 2017.  Found to have an elbow fracture.  At the same time did fall on the left hip which is status post replacement.  Patient states her left hip is worse.  Onset- Chronic Location- lateral  Duration- At night and long rides  Character- Achy  Aggravating factors- laying on it Reliving factors-  Therapies tried-   Severity-7 out of 10   Patient did have a pelvic x-ray done July 01, 2017.  Was found to have a comminuted fracture of the left greater trochanter with history of a bipolar arthroplasty.  Severe arthritis of the right hip noted as well.  Past Medical History:  Diagnosis Date  . Cancer ([place])    breast  . Diabetes mellitus without complication ([place])   . High blood cholesterol   . Hypertension    Past Surgical History:  Procedure Laterality Date  . ABDOMINAL HYSTERECTOMY    . JOINT REPLACEMENT    . MASTECTOMY    . ORIF ELBOW FRACTURE Left 07/01/2017   Procedure: OPEN REDUCTION INTERNAL FIXATION (ORIF) ELBOW/OLECRANON FRACTURE;  Surgeon: Shona Needles, MD;  Location: [facility];  Service: Orthopedics;  Laterality: Left;  . WRIST SURGERY     Social History   Socioeconomic History  . Marital status: Widowed    Spouse name: None  . Number of children: None  . Years of education: None  . Highest education level: None  Social Needs  . Financial resource strain: None  . Food insecurity - worry: None  . Food insecurity - inability: None  . Transportation needs - medical: None  . Transportation needs - non-medical: None  Occupational History  . None  Tobacco Use  . Smoking status: Never Smoker  . Smokeless tobacco: Never Used  Substance and Sexual Activity  . Alcohol use: No   Frequency: Never  . Drug use: No  . Sexual activity: None  Other Topics Concern  . None  Social History Narrative  . None   Allergies  Allergen Reactions  . Morphine And Related Other (See Comments)    Confusion and agitation  . Penicillins Itching   Family History  Problem Relation Age of Onset  . Obesity Grandchild      Past medical history, social, surgical and family history all reviewed in electronic medical record.  No pertanent information unless stated regarding to the chief complaint.   Review of Systems:Review of systems updated and as accurate as of 08/19/17  No headache, visual changes, nausea, vomiting, diarrhea, constipation, dizziness, abdominal pain, skin rash, fevers, chills, night sweats, weight loss, swollen lymph nodes, body aches, joint swelling, chest pain, shortness of breath, mood changes.  Positive muscle aches, weakness  Objective  Blood pressure 132/70, pulse (!) 47, height 5\' 6"  (1.676 m), weight 137 lb (62.1 kg), SpO2 95 %. Systems examined below as of 08/19/17   General: No apparent distress alert and oriented x3 mood and affect normal, dressed appropriately.  HEENT: Pupils equal, extraocular movements intact  Respiratory: Patient's speak in full sentences and does not appear short of breath  Cardiovascular: No lower extremity edema, non tender, no erythema  Skin: Warm dry intact with no signs of infection or rash on extremities or on  axial skeleton.  Abdomen: Soft nontender  Neuro: Cranial nerves II through XII are intact, neurovascularly intact in all extremities with 2+ DTRs and 2+ pulses.  Lymph: No lymphadenopathy of posterior or anterior cervical chain or axillae bilaterally.  Gait antalgic gait MSK:  Non tender with full range of motion and good stability and symmetric strength and tone of shoulders, elbows, wrist,  knee and ankles bilaterally.  Right hip exam shows the patient does have decreasing range of motion with internal rotation but  really no significant pain.  Mild weakness of the pelvic girdle bilaterally.  Left hip shows incision from previous prosthesis is well healed.  Severe tenderness over the lateral aspect with some mild crepitus noted over the greater trochanteric area.  Mild pain over the right and left sacroiliac joint.  Degenerative scoliosis of the back noted.  Negative straight leg test but significant tightness of the hamstrings bilaterally.  4 out of 5 strength of the legs bilaterally.      Impression and Recommendations:     This case required medical decision making of moderate complexity.      Note: This dictation was prepared with Dragon dictation along with smaller phrase technology. Any transcriptional errors that result from this process are unintentional.

## 2017-08-19 ENCOUNTER — Ambulatory Visit: Payer: Self-pay

## 2017-08-19 ENCOUNTER — Ambulatory Visit: Payer: Medicare Other | Admitting: Family Medicine

## 2017-08-19 ENCOUNTER — Encounter: Payer: Self-pay | Admitting: Family Medicine

## 2017-08-19 VITALS — BP 132/70 | HR 47 | Ht 66.0 in | Wt 137.0 lb

## 2017-08-19 DIAGNOSIS — M810 Age-related osteoporosis without current pathological fracture: Secondary | ICD-10-CM | POA: Diagnosis not present

## 2017-08-19 DIAGNOSIS — M25552 Pain in left hip: Secondary | ICD-10-CM

## 2017-08-19 DIAGNOSIS — S72112A Displaced fracture of greater trochanter of left femur, initial encounter for closed fracture: Secondary | ICD-10-CM | POA: Diagnosis not present

## 2017-08-19 MED ORDER — DENOSUMAB 60 MG/ML ~~LOC~~ SOLN
60.0000 mg | Freq: Once | SUBCUTANEOUS | Status: AC
  Administered 2017-08-19: 60 mg via SUBCUTANEOUS

## 2017-08-19 MED ORDER — CALCITONIN (SALMON) 200 UNIT/ACT NA SOLN: 1.0000 | Freq: Every day | NASAL | 0 refills | Status: DC

## 2017-08-19 MED ORDER — VITAMIN D (ERGOCALCIFEROL) 1.25 MG (50000 UNIT) PO CAPS: 50000.0000 [IU] | ORAL_CAPSULE | ORAL | 0 refills | Status: DC

## 2017-08-19 NOTE — Assessment & Plan Note (Signed)
Patient's fracture is likely not healed at this time.  Patient is already getting Prolia for the osteoporosis.  Started on once weekly we discussed the possibility of calcitonin but patient already has hypocalcemia so will not started at this point.  We discussed topical anti-inflammatories which patient was given.  I would like patient to restart formal physical therapy again.  Patient will follow up with me again in 4-6 weeks

## 2017-08-19 NOTE — Patient Instructions (Signed)
Good to see you  I do think the bone will take some time to heal  Once weekly vitamin D for 12 weeks.  Calcitonin to help the calcium go to the bone We will restart the pt as well  See me again in 4 weeks

## 2017-08-29 ENCOUNTER — Other Ambulatory Visit: Payer: Self-pay | Admitting: *Deleted

## 2017-08-29 DIAGNOSIS — R279 Unspecified lack of coordination: Secondary | ICD-10-CM

## 2017-08-29 DIAGNOSIS — R2689 Other abnormalities of gait and mobility: Secondary | ICD-10-CM

## 2017-09-15 NOTE — Progress Notes (Signed)
[facility] [address] [place], [place] [address] Phone: [phone] Subjective:    I'm seeing this patient by the request  of:    CC: Hip pain follow-up  QQP:[id number]  Melanie Hughes is a 82 y.o. female coming in with complaint of hip pain.  Found to have a greater trochanteric fracture.  Started on vitamin D.  We held on calcitonin secondary to hypocalcemia.  Patient states feeling significantly better at this time.  Patient has become more sure on her feet.  Feels that the therapy has been helpful.    patient and will July 01, 2018 to have a comminuted fracture of the left greater trochanteric area with history of bipolar arthroplasty.  This was independently visualized by me.  Past Medical History:  Diagnosis Date  . Cancer ([place])    breast  . Diabetes mellitus without complication ([place])   . High blood cholesterol   . Hypertension    Past Surgical History:  Procedure Laterality Date  . ABDOMINAL HYSTERECTOMY    . JOINT REPLACEMENT    . MASTECTOMY    . ORIF ELBOW FRACTURE Left 07/01/2017   Procedure: OPEN REDUCTION INTERNAL FIXATION (ORIF) ELBOW/OLECRANON FRACTURE;  Surgeon: Shona Needles, MD;  Location: [facility];  Service: Orthopedics;  Laterality: Left;  . WRIST SURGERY     Social History   Socioeconomic History  . Marital status: Widowed    Spouse name: None  . Number of children: None  . Years of education: None  . Highest education level: None  Social Needs  . Financial resource strain: None  . Food insecurity - worry: None  . Food insecurity - inability: None  . Transportation needs - medical: None  . Transportation needs - non-medical: None  Occupational History  . None  Tobacco Use  . Smoking status: Never Smoker  . Smokeless tobacco: Never Used  Substance and Sexual Activity  . Alcohol use: No    Frequency: Never  . Drug use: No  . Sexual activity: None  Other Topics Concern  . None  Social History Narrative  .  None   Allergies  Allergen Reactions  . Morphine And Related Other (See Comments)    Confusion and agitation  . Penicillins Itching   Family History  Problem Relation Age of Onset  . Obesity Grandchild      Past medical history, social, surgical and family history all reviewed in electronic medical record.  No pertanent information unless stated regarding to the chief complaint.   Review of Systems:Review of systems updated and as accurate as of 09/16/17  No headache, visual changes, nausea, vomiting, diarrhea, constipation, dizziness, abdominal pain, skin rash, fevers, chills, night sweats, weight loss, swollen lymph nodes, body aches, joint swelling, chest pain, shortness of breath, mood changes.  Positive muscle aches  Objective  Blood pressure 134/80, height 5\' 6"  (1.676 m), weight 137 lb (62.1 kg). Systems examined below as of 09/16/17   General: No apparent distress alert  mood and affect normal, dressed appropriately.  Mild masked facies HEENT: Pupils equal, extraocular movements intact  Respiratory: Patient's speak in full sentences and does not appear short of breath  Cardiovascular: No lower extremity edema, non tender, no erythema  Skin: Warm dry intact with no signs of infection or rash on extremities or on axial skeleton.  Abdomen: Soft nontender  Neuro: Cranial nerves II through XII are intact, neurovascularly intact in all extremities with 2+ DTRs and 2+ pulses.  Lymph: No  lymphadenopathy of posterior or anterior cervical chain or axillae bilaterally.  Gait antalgic gait with wide base.  MSK:  Non tender with full range of motion and good stability and symmetric strength and tone of shoulders, elbows, wrist, , knee and ankles bilaterally.  Arthritic changes of multiple joints resting tremor noted of the left upper extremity Left hip is decreasing range of motion in all planes.  Unable to do [place] secondary to pain and tightness.  Significant less pain over the greater  trochanteric area than previous exam.  Negative straight leg test.    Impression and Recommendations:     This case required medical decision making of moderate complexity.      Note: This dictation was prepared with Dragon dictation along with smaller phrase technology. Any transcriptional errors that result from this process are unintentional.

## 2017-09-16 ENCOUNTER — Ambulatory Visit: Payer: Medicare Other | Admitting: Family Medicine

## 2017-09-16 ENCOUNTER — Ambulatory Visit (INDEPENDENT_AMBULATORY_CARE_PROVIDER_SITE_OTHER)
Admission: RE | Admit: 2017-09-16 | Discharge: 2017-09-16 | Disposition: A | Discharge status: Home / Self Care | Payer: Medicare Other | Source: Ambulatory Visit | Attending: Family Medicine | Admitting: Family Medicine

## 2017-09-16 ENCOUNTER — Encounter: Payer: Self-pay | Admitting: Family Medicine

## 2017-09-16 VITALS — BP 134/80 | Ht 66.0 in | Wt 137.0 lb

## 2017-09-16 DIAGNOSIS — M25552 Pain in left hip: Secondary | ICD-10-CM

## 2017-09-16 DIAGNOSIS — S72112A Displaced fracture of greater trochanter of left femur, initial encounter for closed fracture: Secondary | ICD-10-CM | POA: Diagnosis not present

## 2017-09-16 NOTE — Assessment & Plan Note (Signed)
Patient seems to be improving somewhat.  Patient encouraged to continue the vitamin D supplementation.  We discussed icing regimen and home exercises.  We discussed which activities of doing which wants to avoid.  Patient will continue with physical therapy at this time.  Patient will come back and see me again 4 weeks

## 2017-09-16 NOTE — Patient Instructions (Signed)
Good to see you  Stay upright as much as you can ;) We will get an xray downstairs Continue the ice for pain and the physical therapy for strength  Continue the vitamin D once a week likely indefinitely.  Lets watch the tremor.  See me again in 4 weeks

## 2017-10-14 ENCOUNTER — Ambulatory Visit: Payer: Medicare Other | Admitting: Family Medicine

## 2017-10-14 ENCOUNTER — Other Ambulatory Visit: Payer: Self-pay

## 2017-10-14 ENCOUNTER — Ambulatory Visit (INDEPENDENT_AMBULATORY_CARE_PROVIDER_SITE_OTHER)
Admission: RE | Admit: 2017-10-14 | Discharge: 2017-10-14 | Disposition: A | Discharge status: Home / Self Care | Payer: Medicare Other | Source: Ambulatory Visit | Attending: Family Medicine | Admitting: Family Medicine

## 2017-10-14 ENCOUNTER — Encounter: Payer: Self-pay | Admitting: Family Medicine

## 2017-10-14 VITALS — BP 128/70 | HR 77 | Ht 66.0 in | Wt 137.0 lb

## 2017-10-14 DIAGNOSIS — D169 Benign neoplasm of bone and articular cartilage, unspecified: Secondary | ICD-10-CM | POA: Diagnosis not present

## 2017-10-14 DIAGNOSIS — M25552 Pain in left hip: Secondary | ICD-10-CM

## 2017-10-14 DIAGNOSIS — S72112A Displaced fracture of greater trochanter of left femur, initial encounter for closed fracture: Secondary | ICD-10-CM

## 2017-10-14 MED ORDER — VITAMIN D (ERGOCALCIFEROL) 1.25 MG (50000 UNIT) PO CAPS: 50000.0000 [IU] | ORAL_CAPSULE | ORAL | 0 refills | Status: DC

## 2017-10-14 NOTE — Progress Notes (Signed)
[facility] [place], [place] [address] Phone: [phone] Subjective:     CC: Left hip pain follow-up  PFX:[id number]  Melanie Hughes is a 82 y.o. female coming in with complaint of hip and knee pain. She does have therapy coming into the house. She continues to have pain on the outside of the left hip at non specific times. Her left knee also continues to bother her.  Patient's left hip was found to have a greater trochanteric fracture.  Does have a hip replacement.  Patient states that she is feeling significantly better.  80% better.  Able to get up and down.  Mild discomfort with walking long distances.  Patient denies much radiation down the leg.     Past Medical History:  Diagnosis Date  . Cancer ([place])    breast  . Diabetes mellitus without complication ([place])   . High blood cholesterol   . Hypertension    Past Surgical History:  Procedure Laterality Date  . ABDOMINAL HYSTERECTOMY    . JOINT REPLACEMENT    . MASTECTOMY    . ORIF ELBOW FRACTURE Left 07/01/2017   Procedure: OPEN REDUCTION INTERNAL FIXATION (ORIF) ELBOW/OLECRANON FRACTURE;  Surgeon: Shona Needles, MD;  Location: [facility];  Service: Orthopedics;  Laterality: Left;  . WRIST SURGERY     Social History   Socioeconomic History  . Marital status: Widowed    Spouse name: Not on file  . Number of children: Not on file  . Years of education: Not on file  . Highest education level: Not on file  Occupational History  . Not on file  Social Needs  . Financial resource strain: Not on file  . Food insecurity:    Worry: Not on file    Inability: Not on file  . Transportation needs:    Medical: Not on file    Non-medical: Not on file  Tobacco Use  . Smoking status: Never Smoker  . Smokeless tobacco: Never Used  Substance and Sexual Activity  . Alcohol use: No    Frequency: Never  . Drug use: No  . Sexual activity: Not on file  Lifestyle  . Physical activity:   Days per week: Not on file    Minutes per session: Not on file  . Stress: Not on file  Relationships  . Social connections:    Talks on phone: Not on file    Gets together: Not on file    Attends religious service: Not on file    Active member of club or organization: Not on file    Attends meetings of clubs or organizations: Not on file    Relationship status: Not on file  Other Topics Concern  . Not on file  Social History Narrative  . Not on file   Allergies  Allergen Reactions  . Morphine And Related Other (See Comments)    Confusion and agitation  . Penicillins Itching   Family History  Problem Relation Age of Onset  . Obesity Grandchild      Past medical history, social, surgical and family history all reviewed in electronic medical record.  No pertanent information unless stated regarding to the chief complaint.   Review of Systems:Review of systems updated and as accurate as of 10/14/17  No headache, visual changes, nausea, vomiting, diarrhea, constipation, dizziness, abdominal pain, skin rash, fevers, chills, night sweats, weight loss, swollen lymph nodes, body aches, joint swelling, chest pain, shortness of breath, mood changes. +  muscle aches.   Objective  Blood pressure 128/70, pulse 77, height 5\' 6"  (1.676 m), weight 137 lb (62.1 kg), SpO2 97 %. Systems examined below as of 10/14/17   General: No apparent distress alert and oriented x3 mood and affect normal, dressed appropriately. Masked facies  HEENT: Pupils equal, extraocular movements intact  Respiratory: Patient's speak in full sentences and does not appear short of breath  Cardiovascular: trace lower extremity edema, non tender, no erythema  Skin: Warm dry intact with no signs of infection or rash on extremities or on axial skeleton.  Abdomen: Soft nontender  Neuro: Cranial nerves II through XII are intact, neurovascularly intact in all extremities with 2+ DTRs and 2+ pulses.  Lymph: No lymphadenopathy  of posterior or anterior cervical chain or axillae bilaterally.  Gait antalgic again but improved and no walker or cain.  MSK:  Mild tender with limited range of motion but good stability and symmetric strength and tone of shoulders, elbows, wrist, , knee and ankles bilaterally.  Left hip still tender over the GT area but improved. No pain in the groin, no instability  Avoided internal ROM due to replacement.     Impression and Recommendations:     This case required medical decision making of moderate complexity.      Note: This dictation was prepared with Dragon dictation along with smaller phrase technology. Any transcriptional errors that result from this process are unintentional.

## 2017-10-14 NOTE — Assessment & Plan Note (Signed)
Will monitor left femur, no signs of systemic findings.

## 2017-10-14 NOTE — Assessment & Plan Note (Signed)
Patient is making improvement.  Minimal tender today.  Patient will get x-rays to make sure there is some bone healing.  Refill meds once weekly vitamin D.  Discussed icing regimen and topical anti-inflammatories given.  Follow-up again in 6 weeks

## 2017-10-14 NOTE — Patient Instructions (Signed)
Good to see you  Ice is your friend  Xray downstairs Refilled the vitamin D  See me again in 6 weeks

## 2017-11-03 ENCOUNTER — Other Ambulatory Visit: Payer: Self-pay | Admitting: Family Medicine

## 2017-11-29 ENCOUNTER — Ambulatory Visit: Payer: Medicare Other | Admitting: Family Medicine

## 2017-12-07 NOTE — Progress Notes (Signed)
[facility] [address] [place], [place] [address] Phone: [phone] Subjective:    CC: Left hip pain follow-up  KGM:[id number]  Melanie Hughes is a 82 y.o. female coming in with complaint of left hip pain.  Patient has been seen previously and had a greater trochanteric fracture.  Has been doing very well with conservative therapy.  Patient states that having really no pain at all.  Has pain in her neck even since we have seen her.  Continues to vitamin D.  No pain at night.     Past Medical History:  Diagnosis Date  . Cancer ([place])    breast  . Diabetes mellitus without complication ([place])   . High blood cholesterol   . Hypertension    Past Surgical History:  Procedure Laterality Date  . ABDOMINAL HYSTERECTOMY    . JOINT REPLACEMENT    . MASTECTOMY    . ORIF ELBOW FRACTURE Left 07/01/2017   Procedure: OPEN REDUCTION INTERNAL FIXATION (ORIF) ELBOW/OLECRANON FRACTURE;  Surgeon: Shona Needles, MD;  Location: [facility];  Service: Orthopedics;  Laterality: Left;  . WRIST SURGERY     Social History   Socioeconomic History  . Marital status: Widowed    Spouse name: Not on file  . Number of children: Not on file  . Years of education: Not on file  . Highest education level: Not on file  Occupational History  . Not on file  Social Needs  . Financial resource strain: Not on file  . Food insecurity:    Worry: Not on file    Inability: Not on file  . Transportation needs:    Medical: Not on file    Non-medical: Not on file  Tobacco Use  . Smoking status: Never Smoker  . Smokeless tobacco: Never Used  Substance and Sexual Activity  . Alcohol use: No    Frequency: Never  . Drug use: No  . Sexual activity: Not on file  Lifestyle  . Physical activity:    Days per week: Not on file    Minutes per session: Not on file  . Stress: Not on file  Relationships  . Social connections:    Talks on phone: Not on file    Gets together: Not on file   Attends religious service: Not on file    Active member of club or organization: Not on file    Attends meetings of clubs or organizations: Not on file    Relationship status: Not on file  Other Topics Concern  . Not on file  Social History Narrative  . Not on file   Allergies  Allergen Reactions  . Morphine And Related Other (See Comments)    Confusion and agitation  . Penicillins Itching   Family History  Problem Relation Age of Onset  . Obesity Grandchild      Past medical history, social, surgical and family history all reviewed in electronic medical record.  No pertanent information unless stated regarding to the chief complaint.   Review of Systems:Review of systems updated and as accurate as of 12/09/17  No headache, visual changes, nausea, vomiting, diarrhea, constipation, dizziness, abdominal pain, skin rash, fevers, chills, night sweats, weight loss, swollen lymph nodes, body aches, joint swelling,  chest pain, shortness of breath, mood changes.  Positive muscle aches  Objective  Blood pressure 110/80, pulse 66, height 5\' 6"  (1.676 m), weight 135 lb 9.6 oz (61.5 kg), SpO2 93 %. Systems examined below as of 12/09/17  General: No apparent distress alert and oriented x3 mood and affect normal, dressed appropriately.  HEENT: Pupils equal, extraocular movements intact  Respiratory: Patient's speak in full sentences and does not appear short of breath  Cardiovascular: No lower extremity edema, non tender, no erythema  Skin: Warm dry intact with no signs of infection or rash on extremities or on axial skeleton.  Abdomen: Soft nontender  Neuro: Cranial nerves II through XII are intact, neurovascularly intact in all extremities with 2+ DTRs and 2+ pulses.  Lymph: No lymphadenopathy of posterior or anterior cervical chain or axillae bilaterally.  Gait normal with good balance and coordination.  MSK: Mild tender with full range of motion and good stability and symmetric  strength and tone of shoulders, elbows, wrist,  knee and ankles bilaterally.  Arthritic changes of multiple joints Left hip exam shows very minimal tenderness over the greater trochanteric area.  Near full range of motion with mild lacking of 5 degrees of internal rotation.  Negative straight leg test    Impression and Recommendations:     This case required medical decision making of moderate complexity.      Note: This dictation was prepared with Dragon dictation along with smaller phrase technology. Any transcriptional errors that result from this process are unintentional.

## 2017-12-09 ENCOUNTER — Ambulatory Visit: Payer: Medicare Other | Admitting: Family Medicine

## 2017-12-09 ENCOUNTER — Encounter: Payer: Self-pay | Admitting: Family Medicine

## 2017-12-09 DIAGNOSIS — D169 Benign neoplasm of bone and articular cartilage, unspecified: Secondary | ICD-10-CM

## 2017-12-09 DIAGNOSIS — S72112D Displaced fracture of greater trochanter of left femur, subsequent encounter for closed fracture with routine healing: Secondary | ICD-10-CM | POA: Diagnosis not present

## 2017-12-09 NOTE — Assessment & Plan Note (Signed)
Patient is likely healed at this time.  Do not feel that x-rays are needed.  Patient will continue with the Prolia as well as the once weekly vitamin D.  Follow-up as needed

## 2017-12-09 NOTE — Assessment & Plan Note (Signed)
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  Discussed the possibility of repeating imaging.  We will continue to monitor [place] a farm of any worsening symptoms we will repeat.

## 2017-12-27 ENCOUNTER — Other Ambulatory Visit: Payer: Self-pay | Admitting: *Deleted

## 2017-12-27 DIAGNOSIS — M8000XA Age-related osteoporosis with current pathological fracture, unspecified site, initial encounter for fracture: Secondary | ICD-10-CM

## 2018-01-12 ENCOUNTER — Other Ambulatory Visit (INDEPENDENT_AMBULATORY_CARE_PROVIDER_SITE_OTHER): Payer: Medicare Other

## 2018-01-12 ENCOUNTER — Encounter: Payer: Self-pay | Admitting: Family

## 2018-01-12 ENCOUNTER — Ambulatory Visit: Payer: Medicare Other | Admitting: Family

## 2018-01-12 VITALS — BP 102/60 | HR 75 | Temp 97.6°F | Ht 66.0 in | Wt 134.1 lb

## 2018-01-12 DIAGNOSIS — R350 Frequency of micturition: Secondary | ICD-10-CM

## 2018-01-12 DIAGNOSIS — R739 Hyperglycemia, unspecified: Secondary | ICD-10-CM

## 2018-01-12 DIAGNOSIS — S90811A Abrasion, right foot, initial encounter: Secondary | ICD-10-CM | POA: Diagnosis not present

## 2018-01-12 LAB — URINALYSIS, ROUTINE W REFLEX MICROSCOPIC
Bilirubin Urine: NEGATIVE
HGB URINE DIPSTICK: NEGATIVE
Nitrite: NEGATIVE
PH: 6.5 (ref 5.0–8.0)
SPECIFIC GRAVITY, URINE: 1.015 (ref 1.000–1.030)
TOTAL PROTEIN, URINE-UPE24: NEGATIVE
UROBILINOGEN UA: 0.2 (ref 0.0–1.0)
Urine Glucose: NEGATIVE

## 2018-01-12 LAB — CBC WITH DIFFERENTIAL/PLATELET
BASOS ABS: 0 10*3/uL (ref 0.0–0.1)
BASOS PCT: 0.7 % (ref 0.0–3.0)
EOS ABS: 0 10*3/uL (ref 0.0–0.7)
Eosinophils Relative: 0.7 % (ref 0.0–5.0)
HCT: 35.9 % — ABNORMAL LOW (ref 36.0–46.0)
Hemoglobin: 11.7 g/dL — ABNORMAL LOW (ref 12.0–15.0)
Lymphocytes Relative: 13.1 % (ref 12.0–46.0)
Lymphs Abs: 0.9 10*3/uL (ref 0.7–4.0)
MCHC: 32.7 g/dL (ref 30.0–36.0)
MCV: 89.7 fl (ref 78.0–100.0)
Monocytes Absolute: 0.4 10*3/uL (ref 0.1–1.0)
Monocytes Relative: 6.4 % (ref 3.0–12.0)
NEUTROS ABS: 5.5 10*3/uL (ref 1.4–7.7)
Neutrophils Relative %: 79.1 % — ABNORMAL HIGH (ref 43.0–77.0)
PLATELETS: 208 10*3/uL (ref 150.0–400.0)
RBC: 4 Mil/uL (ref 3.87–5.11)
RDW: 14.1 % (ref 11.5–15.5)
WBC: 7 10*3/uL (ref 4.0–10.5)

## 2018-01-12 LAB — COMPREHENSIVE METABOLIC PANEL
ALBUMIN: 4.2 g/dL (ref 3.5–5.2)
ALT: 8 U/L (ref 0–35)
AST: 15 U/L (ref 0–37)
Alkaline Phosphatase: 47 U/L (ref 39–117)
BILIRUBIN TOTAL: 0.5 mg/dL (ref 0.2–1.2)
BUN: 22 mg/dL (ref 6–23)
CALCIUM: 9.4 mg/dL (ref 8.4–10.5)
CHLORIDE: 102 meq/L (ref 96–112)
CO2: 30 meq/L (ref 19–32)
CREATININE: 1.02 mg/dL (ref 0.40–1.20)
GFR: 55.37 mL/min — ABNORMAL LOW (ref >60.00)
Glucose, Bld: 175 mg/dL — ABNORMAL HIGH (ref 70–99)
Potassium: 4.5 mEq/L (ref 3.5–5.1)
SODIUM: 139 meq/L (ref 135–145)
Total Protein: 6.9 g/dL (ref 6.0–8.3)

## 2018-01-12 MED ORDER — SULFAMETHOXAZOLE-TRIMETHOPRIM 800-160 MG PO TABS: 1.0000 | ORAL_TABLET | Freq: Two times a day (BID) | ORAL | 0 refills | Status: DC

## 2018-01-12 NOTE — Progress Notes (Signed)
Melanie Hughes is a 82 y.o. female with the following history as recorded in EpicCare:  Patient Active Problem List   Diagnosis Date Noted  . Chondroma 10/14/2017  . Normocytic anemia 06/29/2017  . Hx of breast cancer 06/29/2017  . Hypomagnesemia 06/29/2017  . Hyperlipidemia 06/29/2017  . Diabetes mellitus without complication ([place]) 94/85/4627  . Hypertension 06/28/2017  . Closed left hip fracture, initial encounter ([place]) 06/28/2017  . Closed fracture of left olecranon process 06/28/2017  . Anxiety 06/28/2017  . Closed displaced fracture of greater trochanter of left femur (HCC)     Current Outpatient Medications  Medication Sig Dispense Refill  . aspirin EC 81 MG tablet Take 81 mg by mouth daily.    . bisacodyl (DULCOLAX) 5 MG EC tablet Take 1 tablet (5 mg total) by mouth daily as needed for moderate constipation. 30 tablet 0  . escitalopram (LEXAPRO) 10 MG tablet Take 10 mg by mouth at bedtime.    Marland Kitchen glipiZIDE (GLUCOTROL) 5 MG tablet TAKE 1 TABLET (5 MG) BY MOUTH 2 TIMES PER DAY 30 MINUTES BEFORE MEALS  3  . lisinopril-hydrochlorothiazide (PRINZIDE,ZESTORETIC) 20-12.5 MG tablet Take 1 tablet by mouth every morning.  3  . LORazepam (ATIVAN) 0.5 MG tablet Take 1 tablet (0.5 mg total) by mouth daily. 10 tablet 0  . metFORMIN (GLUCOPHAGE) 1000 MG tablet Take 1 tablet (1,000 mg total) by mouth 2 (two) times daily.    . Multiple Vitamin (MULTIVITAMIN WITH MINERALS) TABS tablet Take 1 tablet by mouth daily.    . Multiple Vitamins-Minerals (PRESERVISION AREDS 2+MULTI VIT) CAPS Take 2 capsules by mouth daily.    . pioglitazone (ACTOS) 30 MG tablet Take 30 mg by mouth daily.    . simvastatin (ZOCOR) 10 MG tablet Take 10 mg by mouth daily.    . Vitamin D, Ergocalciferol, (DRISDOL) 50000 units CAPS capsule Take 1 capsule (50,000 Units total) by mouth every 7 (seven) days. 12 capsule 0  . Vitamin D, Ergocalciferol, (DRISDOL) 50000 units CAPS capsule TAKE 1 CAPSULE (50,000 UNITS TOTAL) BY MOUTH  EVERY 7 (SEVEN) DAYS. 12 capsule 0  . sulfamethoxazole-trimethoprim (BACTRIM DS,SEPTRA DS) 800-160 MG tablet Take 1 tablet by mouth 2 (two) times daily. 10 tablet 0   No current facility-administered medications for this visit.     Allergies: Morphine and related and Penicillins  Past Medical History:  Diagnosis Date  . Cancer ([place])    breast  . Diabetes mellitus without complication ([place])   . High blood cholesterol   . Hypertension     Past Surgical History:  Procedure Laterality Date  . ABDOMINAL HYSTERECTOMY    . JOINT REPLACEMENT    . MASTECTOMY    . ORIF ELBOW FRACTURE Left 07/01/2017   Procedure: OPEN REDUCTION INTERNAL FIXATION (ORIF) ELBOW/OLECRANON FRACTURE;  Surgeon: Shona Needles, MD;  Location: [facility];  Service: Orthopedics;  Laterality: Left;  . WRIST SURGERY      Family History  Problem Relation Age of Onset  . Obesity Grandchild     Social History   Tobacco Use  . Smoking status: Never Smoker  . Smokeless tobacco: Never Used  Substance Use Topics  . Alcohol use: No    Frequency: Never    Subjective:   Patient presents accompanied by 2 of her children today; had an episode at home last night where her glucometer gave a reading of 500 after eating dinner last night- ate tomato sandwich, corn and small bite of coconut pie; she is adamant that she had  taken all of her medications correctly; denies any blurred vision, excessive thirst or urination; this morning, her sugars had normalized to 140s after she took her morning medication; she states it was a new glucometer and she is confident that the machine was giving accurate readings. She has never had a blood sugar this high since being diagnosed with diabetes 30 years ago; notes that she feels "great today." Does have persistent wound on outer right foot- has been treating with OTC medications;   Patient brings in copies of recent labs done by her PCP- last Hgba1c was at 6.8 in June and lipid panel is normal;      Objective:  Vitals:   01/12/18 1415  BP: 102/60  Pulse: 75  Temp: 97.6 F (36.4 C)  TempSrc: Oral  SpO2: 98%  Weight: 134 lb 1.3 oz (60.8 kg)  Height: '5\' 6"'$  (1.676 m)    General: Well developed, well nourished, in no acute distress  Skin : Warm and dry. Erythematous skin abrasion noted on outer side of right foot Head: Normocephalic and atraumatic  Eyes: Sclera and conjunctiva clear; pupils round and reactive to light; extraocular movements intact  Ears: External normal; canals clear; tympanic membranes normal  Oropharynx: Pink, supple. No suspicious lesions  Neck: Supple without thyromegaly, adenopathy  Lungs: Respirations unlabored; clear to auscultation bilaterally without wheeze, rales, rhonchi  CVS exam: normal rate and regular rhythm.  Neurologic: Alert and oriented; speech intact; face symmetrical; moves all extremities well; CNII-XII intact without focal deficit  Assessment:  1. Hyperglycemia   2. Urinary frequency     Plan:  1. POC glucose in office is 200 and patient has just eaten lunch; ? Valid reading from her machine of 500 last night- clinical picture is not consistent with such an elevated blood sugar; will update labs and U/A, urine culture today; will go ahead and treat with Bactrim x 5 days for skin infection as this could be affecting her blood sugar as well; Discussed with patient that will most likely be changing her medications to a newer agent from the Glucotrol and she is comfortable with this treatment plan.    No follow-ups on file.  Orders Placed This Encounter  Procedures  . Urine Culture    Standing Status:   Future    Number of Occurrences:   1    Standing Expiration Date:   01/12/2019  . CBC w/Diff    Standing Status:   Future    Number of Occurrences:   1    Standing Expiration Date:   01/12/2019  . Comp Met (CMET)    Standing Status:   Future    Number of Occurrences:   1    Standing Expiration Date:   01/12/2019  . Urinalysis    Standing  Status:   Future    Number of Occurrences:   1    Standing Expiration Date:   01/12/2019    Requested Prescriptions   Signed Prescriptions Disp Refills  . sulfamethoxazole-trimethoprim (BACTRIM DS,SEPTRA DS) 800-160 MG tablet 10 tablet 0    Sig: Take 1 tablet by mouth 2 (two) times daily.

## 2018-01-13 ENCOUNTER — Telehealth: Payer: Self-pay | Admitting: Family

## 2018-01-13 ENCOUNTER — Other Ambulatory Visit: Payer: Self-pay | Admitting: Family Medicine

## 2018-01-13 ENCOUNTER — Other Ambulatory Visit: Payer: Self-pay | Admitting: Family

## 2018-01-13 LAB — URINE CULTURE
MICRO NUMBER:: 90822808
SPECIMEN QUALITY:: ADEQUATE

## 2018-01-13 MED ORDER — BLOOD GLUCOSE MONITOR KIT: PACK | 0 refills | Status: DC

## 2018-01-13 NOTE — Telephone Encounter (Signed)
Patient will call us back next week with her readings.

## 2018-01-13 NOTE — Telephone Encounter (Signed)
Called and spoke with patient today regarding info.

## 2018-01-13 NOTE — Telephone Encounter (Signed)
Please let patient know that the labs we drew yesterday were very reassuring;  Her glucose was at 175- c/w what our meter read in the office; her hemoglobin level looked much compared to 6 months ago when she had broken her hip. She does not look anemic at this time.  Let's see how the new meter does for her. I am still suspicious the meter is giving false readings. The urine culture is pending and we should have that back on Monday.

## 2018-01-16 ENCOUNTER — Other Ambulatory Visit: Payer: Self-pay

## 2018-01-16 ENCOUNTER — Ambulatory Visit (INDEPENDENT_AMBULATORY_CARE_PROVIDER_SITE_OTHER)
Admission: RE | Admit: 2018-01-16 | Discharge: 2018-01-16 | Disposition: A | Discharge status: Home / Self Care | Payer: Medicare Other | Source: Ambulatory Visit | Attending: Family | Admitting: Family

## 2018-01-16 DIAGNOSIS — M8000XA Age-related osteoporosis with current pathological fracture, unspecified site, initial encounter for fracture: Secondary | ICD-10-CM

## 2018-01-16 MED ORDER — BLOOD GLUCOSE MONITOR KIT: PACK | 0 refills | Status: AC

## 2018-01-30 ENCOUNTER — Ambulatory Visit: Payer: Medicare Other | Admitting: Family

## 2018-01-30 ENCOUNTER — Encounter: Payer: Self-pay | Admitting: Family

## 2018-01-30 VITALS — BP 112/64 | HR 72 | Temp 97.4°F | Resp 18 | Ht 66.0 in | Wt 138.0 lb

## 2018-01-30 DIAGNOSIS — F419 Anxiety disorder, unspecified: Secondary | ICD-10-CM

## 2018-01-30 DIAGNOSIS — Z853 Personal history of malignant neoplasm of breast: Secondary | ICD-10-CM

## 2018-01-30 DIAGNOSIS — E119 Type 2 diabetes mellitus without complications: Secondary | ICD-10-CM

## 2018-01-30 MED ORDER — LORAZEPAM 0.5 MG PO TABS: 0.5000 mg | ORAL_TABLET | Freq: Three times a day (TID) | ORAL | 0 refills | Status: DC | PRN

## 2018-01-30 NOTE — Progress Notes (Addendum)
Melanie Hughes is a 82 y.o. female with the following history as recorded in EpicCare:  Patient Active Problem List   Diagnosis Date Noted  . Chondroma 10/14/2017  . Normocytic anemia 06/29/2017  . Hx of breast cancer 06/29/2017  . Hypomagnesemia 06/29/2017  . Hyperlipidemia 06/29/2017  . Diabetes mellitus without complication ([place]) 24/03/7352  . Hypertension 06/28/2017  . Closed left hip fracture, initial encounter ([place]) 06/28/2017  . Closed fracture of left olecranon process 06/28/2017  . Anxiety 06/28/2017  . Closed displaced fracture of greater trochanter of left femur (HCC)     Current Outpatient Medications  Medication Sig Dispense Refill  . aspirin EC 81 MG tablet Take 81 mg by mouth daily.    . bisacodyl (DULCOLAX) 5 MG EC tablet Take 1 tablet (5 mg total) by mouth daily as needed for moderate constipation. 30 tablet 0  . blood glucose meter kit and supplies KIT Use up to 4x daily as directed---diagnosis code E11.9 1 each 0  . Calcium Carb-Cholecalciferol (CALCIUM 500+D PO) Calcium 500 + D    . CONTOUR NEXT TEST test strip   1  . escitalopram (LEXAPRO) 10 MG tablet Take 10 mg by mouth at bedtime.    Marland Kitchen glipiZIDE (GLUCOTROL) 5 MG tablet TAKE 1 TABLET (5 MG) BY MOUTH 2 TIMES PER DAY 30 MINUTES BEFORE MEALS  3  . lisinopril-hydrochlorothiazide (PRINZIDE,ZESTORETIC) 20-12.5 MG tablet Take 1 tablet by mouth every morning.  3  . LITE TOUCH LANCETS MISC   1  . LORazepam (ATIVAN) 0.5 MG tablet Take 1 tablet (0.5 mg total) by mouth every 8 (eight) hours as needed for anxiety. 90 tablet 0  . metFORMIN (GLUCOPHAGE) 1000 MG tablet Take 1 tablet (1,000 mg total) by mouth 2 (two) times daily.    . Multiple Vitamin (MULTIVITAMIN WITH MINERALS) TABS tablet Take 1 tablet by mouth daily.    . Multiple Vitamins-Minerals (PRESERVISION AREDS 2+MULTI VIT) CAPS Take 2 capsules by mouth daily.    Marland Kitchen PARoxetine (PAXIL) 10 MG tablet paroxetine 10 mg tablet    . pioglitazone (ACTOS) 30 MG tablet Take 30  mg by mouth daily.    . simvastatin (ZOCOR) 10 MG tablet Take 10 mg by mouth daily.    Marland Kitchen UNABLE TO FIND nitrofurantoin monohydrate/macrocrystals 100 mg capsule    . Vitamin D, Ergocalciferol, (DRISDOL) 50000 units CAPS capsule TAKE 1 CAPSULE (50,000 UNITS TOTAL) BY MOUTH EVERY 7 (SEVEN) DAYS. 12 capsule 0   No current facility-administered medications for this visit.     Allergies: Morphine and related and Penicillins  Past Medical History:  Diagnosis Date  . Cancer ([place])    breast  . Diabetes mellitus without complication ([place])   . High blood cholesterol   . Hypertension     Past Surgical History:  Procedure Laterality Date  . ABDOMINAL HYSTERECTOMY    . JOINT REPLACEMENT    . MASTECTOMY    . ORIF ELBOW FRACTURE Left 07/01/2017   Procedure: OPEN REDUCTION INTERNAL FIXATION (ORIF) ELBOW/OLECRANON FRACTURE;  Surgeon: Shona Needles, MD;  Location: [facility];  Service: Orthopedics;  Laterality: Left;  . WRIST SURGERY      Family History  Problem Relation Age of Onset  . Obesity Grandchild     Social History   Tobacco Use  . Smoking status: Never Smoker  . Smokeless tobacco: Never Used  Substance Use Topics  . Alcohol use: No    Frequency: Never    Subjective:  Patient presents to establish care/ transfer from  her provider in [place], [place]; has gotten new glucometer recently and trying to eat more regular meals; feeling better recently- has gained 4 pounds since last office visit. Denies any chest pain, shortness of breath, blurred vision or headache. Under care of Dr. Tamala Julian for osteoporosis/ Vitamin D deficiency- will stay on Prolia and Vitamin D; Needs updated Rx for Ativan which she uses occasionally for situational anxiety;    Objective:  Vitals:   01/30/18 0955  BP: 112/64  Pulse: 72  Resp: 18  Temp: (!) 97.4 F (36.3 C)  Weight: 138 lb 0.6 oz (62.6 kg)  Height: _0  (1.676 m)    General: Well developed, well nourished, in no acute distress  Skin : Warm and dry.   Head: Normocephalic and atraumatic  Lungs: Respirations unlabored; clear to auscultation bilaterally without wheeze, rales, rhonchi  CVS exam: normal rate and regular rhythm.  Neurologic: Alert and oriented; speech intact; face symmetrical; moves all extremities well; CNII-XII intact without focal deficit   Assessment:  1. Diabetes mellitus without complication ([name])   2. Anxiety   3. Hx of breast cancer     Plan:  Refills updated; medication review done; plan to return in 4-6 months for updated labs; she has AWV scheduled in the next month; follow-up to be determined.  Prescription for mastectomy bras will be sent as requested to specific store in [place], [place]; her current bras are worn out and request is medically appropriate.   No follow-ups on file.  No orders of the defined types were placed in this encounter.   Requested Prescriptions   Signed Prescriptions Disp Refills  . LORazepam (ATIVAN) 0.5 MG tablet 90 tablet 0    Sig: Take 1 tablet (0.5 mg total) by mouth every 8 (eight) hours as needed for anxiety.

## 2018-02-02 ENCOUNTER — Telehealth: Payer: Self-pay | Admitting: Family

## 2018-02-02 DIAGNOSIS — Z9010 Acquired absence of unspecified breast and nipple: Secondary | ICD-10-CM

## 2018-02-02 NOTE — Telephone Encounter (Signed)
Is this something we can do?

## 2018-02-02 NOTE — Telephone Encounter (Signed)
Copied from [place] [id number]. Topic: Quick Communication - See Telephone Encounter >> Feb 02, 2018  1:24 PM Hewitt Shorts wrote: [facility] a new bra and they need an rx to be able to file this with medicare   [phone] phone  Fax number is [phone]

## 2018-02-02 NOTE — Addendum Note (Signed)
Addended by: Sherlene Shams on: 02/02/2018 04:34 PM   Modules accepted: Orders

## 2018-02-02 NOTE — Telephone Encounter (Signed)
Yes- this is done for breast cancer patients. I will put order on your desk.

## 2018-02-03 ENCOUNTER — Telehealth: Payer: Self-pay

## 2018-02-03 NOTE — Telephone Encounter (Signed)
Faxed order for bra to [phone] for patient to obtain mastectomy specialized bra.

## 2018-02-15 NOTE — Progress Notes (Addendum)
Subjective:   Melanie Hughes is a 82 y.o. female who presents for an Initial Medicare Annual Wellness Visit.  Review of Systems    No ROS.  Medicare Wellness Visit. Additional risk factors are reflected in the social history.   Cardiac Risk Factors include: advanced age (>20mn, >>73women);diabetes mellitus;dyslipidemia;hypertension Sleep patterns: gets up 1-2 times nightly to void and sleeps 6-7 hours nightly.    Home Safety/Smoke Alarms: Feels safe in home. Smoke alarms in place.  Living environment; residence and Firearm Safety: [place] can live on one level, no firearms. Lives alone, no needs for DME, good support system Seat Belt Safety/Bike Helmet: Wears seat belt.      Objective:    Today's Vitals   02/16/18 1344  BP: (!) 126/58  Pulse: 85  Resp: 18  SpO2: 100%  Weight: 133 lb 9.6 oz (60.6 kg)  Height: '5\' 6"'$  (1.676 m)   Body mass index is 21.56 kg/m.  Advanced Directives 02/16/2018 06/28/2017  Does Patient Have a Medical Advance Directive? Yes Yes  Type of [place] Does patient want to make changes to medical advance directive? - No - Patient declined  Copy of [place] Chart? No - copy requested No - copy requested    Current Medications (verified) Outpatient Encounter Medications as of 02/16/2018  Medication Sig  . aspirin EC 81 MG tablet Take 81 mg by mouth daily.  . bisacodyl (DULCOLAX) 5 MG EC tablet Take 1 tablet (5 mg total) by mouth daily as needed for moderate constipation.  . blood glucose meter kit and supplies KIT Use up to 4x daily as directed---diagnosis code E11.9  . CONTOUR NEXT TEST test strip   . escitalopram (LEXAPRO) 10 MG tablet Take 10 mg by mouth at bedtime.  .Marland KitchenglipiZIDE (GLUCOTROL) 5 MG tablet TAKE 1 TABLET (5 MG) BY MOUTH 2 TIMES PER DAY 30 MINUTES BEFORE MEALS  . lisinopril-hydrochlorothiazide (PRINZIDE,ZESTORETIC) 20-12.5 MG  tablet Take 1 tablet by mouth every morning.  .Marland KitchenLITE TOUCH LANCETS MISC   . LORazepam (ATIVAN) 0.5 MG tablet Take 1 tablet (0.5 mg total) by mouth every 8 (eight) hours as needed for anxiety.  . metFORMIN (GLUCOPHAGE) 1000 MG tablet Take 1 tablet (1,000 mg total) by mouth 2 (two) times daily.  . Multiple Vitamin (MULTIVITAMIN WITH MINERALS) TABS tablet Take 1 tablet by mouth daily.  . Multiple Vitamins-Minerals (PRESERVISION AREDS 2+MULTI VIT) CAPS Take 2 capsules by mouth daily.  . pioglitazone (ACTOS) 30 MG tablet Take 30 mg by mouth daily.  . simvastatin (ZOCOR) 10 MG tablet Take 10 mg by mouth daily.  . Vitamin D, Ergocalciferol, (DRISDOL) 50000 units CAPS capsule TAKE 1 CAPSULE (50,000 UNITS TOTAL) BY MOUTH EVERY 7 (SEVEN) DAYS.   No facility-administered encounter medications on file as of 02/16/2018.     Allergies (verified) Morphine and related and Penicillins   History: Past Medical History:  Diagnosis Date  . Cancer ([place]    breast  . Diabetes [place] without complication ([place]   . High blood cholesterol   . Hypertension    Past Surgical History:  Procedure Laterality Date  . ABDOMINAL HYSTERECTOMY    . JOINT REPLACEMENT    . MASTECTOMY    . ORIF ELBOW FRACTURE Left 07/01/2017   Procedure: OPEN REDUCTION INTERNAL FIXATION (ORIF) ELBOW/OLECRANON FRACTURE;  Surgeon: HShona Needles MD;  [facility]  Service: Orthopedics;  Laterality: Left;  . WRIST SURGERY  Family History  Problem Relation Age of Onset  . Obesity Grandchild    Social History   Socioeconomic History  . Marital status: Widowed    Spouse name: Not on file  . Number of children: 3  . Years of education: Not on file  . Highest education level: Not on file  Occupational History  . Not on file  Social Needs  . Financial resource strain: Not hard at all  . Food insecurity:    Worry: Never true    Inability: Never true  . Transportation needs:    Medical: No    Non-medical: No  Tobacco  Use  . Smoking status: Never Smoker  . Smokeless tobacco: Never Used  Substance and Sexual Activity  . Alcohol use: No    Frequency: Never  . Drug use: No  . Sexual activity: Never  Lifestyle  . Physical activity:    Days per week: 3 days    Minutes per session: 40 min  . Stress: Only a little  Relationships  . Social connections:    Talks on phone: More than three times a week    Gets together: More than three times a week    Attends religious service: More than 4 times per year    Active member of club or organization: Not on file    Attends meetings of clubs or organizations: More than 4 times per year    Relationship status: Widowed  Other Topics Concern  . Not on file  Social History Narrative  . Not on file    Tobacco Counseling Counseling given: Not Answered  Activities of Daily Living In your present state of health, do you have any difficulty performing the following activities: 02/16/2018 07/04/2017  Hearing? N N  Vision? N N  Difficulty concentrating or making decisions? N N  Walking or climbing stairs? N Y  Dressing or bathing? N Y  Doing errands, shopping? N Y  Conservation officer, nature and eating ? N -  Using the Toilet? N -  In the past six months, have you accidently leaked urine? N -  Do you have problems with loss of bowel control? N -  Managing your Medications? N -  Managing your Finances? N -  Housekeeping or managing your Housekeeping? N -     Immunizations and Health Maintenance  There is no immunization history on file for this patient. Health Maintenance Due  Topic Date Due  . TETANUS/TDAP  09/12/1954  . HEMOGLOBIN A1C  12/29/2017  . INFLUENZA VACCINE  02/02/2018    Patient Care Team: Marrian Salvage, FNP as PCP - General (Internal Medicine)  Indicate any recent Medical Services you may have received from other than [facility] providers in the past year (date may be approximate).     Assessment:   This is a routine wellness examination  for Marianny. Physical assessment deferred to PCP.  Hearing/Vision screen Hearing Screening Comments: Reports HOH per her children. Patient declines audiology referral. Resources provided for future use if needed. Passed whisper test. Vision Screening Comments: appointment yearly Dr. Elmer Sow  Dietary issues and exercise activities discussed: Current Exercise Habits: Home exercise routine, Type of exercise: walking, Time (Minutes): 35, Frequency (Times/Week): 3, Weekly Exercise (Minutes/Week): 105, Intensity: Mild, Exercise limited by: orthopedic condition(s)  Diet (meal preparation, eat out, water intake, caffeinated beverages, dairy products, fruits and vegetables): in general, a "healthy" diet  , well balanced   Reviewed heart healthy and diabetic diet. Encouraged patient to increase daily water and  healthy fluid intake. Diet education was attached to patient's AVS.  Goals    . Patient Stated    . Patient Stated     Continue to eat healthy to lower HGB A1c, stay active physically and socially, get plenty of sleep, increase the amount of water I drink daily.      Depression Screen PHQ 2/9 Scores 02/16/2018  PHQ - 2 Score 1  PHQ- 9 Score 3    Fall Risk Fall Risk  02/16/2018 01/12/2018  Falls in the past year? Yes Yes  Number falls in past yr: 2 or more 2 or more  Injury with Fall? Yes Yes  Risk Factor Category  High Fall Risk -  Risk for fall due to : Impaired balance/gait -  Follow up Education provided;Falls prevention discussed Falls evaluation completed   Cognitive Function: MMSE - Mini Mental State Exam 02/16/2018  Orientation to time 5  Orientation to Place 5  Registration 3  Attention/ Calculation 5  Recall 3  Language- name 2 objects 2  Language- repeat 1  Language- follow 3 step command 3  Language- read & follow direction 1  Write a sentence 1  Copy design 1  Total score 30        Screening Tests Health Maintenance  Topic Date Due  . TETANUS/TDAP   09/12/1954  . HEMOGLOBIN A1C  12/29/2017  . INFLUENZA VACCINE  02/02/2018  . PNA vac Low Risk Adult (1 of 2 - PCV13) 02/17/2019 (Originally 09/13/2002)  . OPHTHALMOLOGY EXAM  04/18/2018  . FOOT EXAM  01/31/2019  . DEXA SCAN  Completed     Plan:    Continue doing brain stimulating activities (puzzles, reading, adult coloring books, staying active) to keep memory sharp.   Continue to eat heart healthy diet (full of fruits, vegetables, whole grains, lean protein, water--limit salt, fat, and sugar intake) and increase physical activity as tolerated.  I have personally reviewed and noted the following in the patient's chart:   . Medical and social history . Use of alcohol, tobacco or illicit drugs  . Current medications and supplements . Functional ability and status . Nutritional status . Physical activity . Advanced directives . List of other physicians . Vitals . Screenings to include cognitive, depression, and falls . Referrals and appointments  In addition, I have reviewed and discussed with patient certain preventive protocols, quality metrics, and best practice recommendations. A written personalized care plan for preventive services as well as general preventive health recommendations were provided to patient.     Michiel Cowboy, RN   02/16/2018   Medical screening examination/treatment/procedure(s) were performed by non-physician practitioner and as supervising provider I was immediately available for consultation/collaboration.  I agree with above. Marrian Salvage, FNP

## 2018-02-16 ENCOUNTER — Ambulatory Visit (INDEPENDENT_AMBULATORY_CARE_PROVIDER_SITE_OTHER): Payer: Medicare Other | Admitting: *Deleted

## 2018-02-16 VITALS — BP 126/58 | HR 85 | Resp 18 | Ht 66.0 in | Wt 133.6 lb

## 2018-02-16 DIAGNOSIS — M810 Age-related osteoporosis without current pathological fracture: Secondary | ICD-10-CM | POA: Diagnosis not present

## 2018-02-16 DIAGNOSIS — Z0000 Encounter for general adult medical examination without abnormal findings: Secondary | ICD-10-CM | POA: Diagnosis not present

## 2018-02-16 MED ORDER — DENOSUMAB 60 MG/ML ~~LOC~~ SOSY
60.0000 mg | PREFILLED_SYRINGE | Freq: Once | SUBCUTANEOUS | Status: AC
  Administered 2018-02-16: 60 mg via SUBCUTANEOUS

## 2018-02-16 NOTE — Patient Instructions (Addendum)
[employer] in [place], [place] in: [place] Address: [address], [place], [place] [address] Phone: [phone]  [facility] [place] [facility] pathologist in [place], [place] Address: [address], [place], [place] [address] Phone: [phone]  Continue doing brain stimulating activities (puzzles, reading, adult coloring books, staying active) to keep memory sharp.   Continue to eat heart healthy diet (full of fruits, vegetables, whole grains, lean protein, water--limit salt, fat, and sugar intake) and increase physical activity as tolerated.   Melanie Hughes , Thank you for taking time to come for your Medicare Wellness Visit. I appreciate your ongoing commitment to your health goals. Please review the following plan we discussed and let me know if I can assist you in the future.   These are the goals we discussed: Goals    . Patient Stated    . Patient Stated     Continue to eat healthy to lower HGB A1c, stay active physically and socially, get plenty of sleep, increase the amount of water I drink daily.       This is a list of the screening recommended for you and due dates:  Health Maintenance  Topic Date Due  . Tetanus Vaccine  09/12/1954  . Hemoglobin A1C  12/29/2017  . Flu Shot  02/02/2018  . Pneumonia vaccines (1 of 2 - PCV13) 02/17/2019*  . Eye exam for diabetics  04/18/2018  . Complete foot exam   01/31/2019  . DEXA scan (bone density measurement)  Completed  *Topic was postponed. The date shown is not the original due date.     Protein Content in Foods Generally, most healthy people need around 50 grams of protein each day. Depending on your overall health, you may need more or less protein in your diet. Talk to your health care provider or dietitian about how much protein you need. See the following list for the protein content of some common foods. High-protein foods High-protein  foods contain 4 grams (4 g) or more of protein per serving. They include:  Beef, ground sirloin (cooked) - 3 oz have 24 g of protein.  Cheese (hard) - 1 oz has 7 g of protein.  Chicken breast, boneless and skinless (cooked) - 3 oz have 13.4 g of protein.  Cottage cheese - 1/2 cup has 13.4 g of protein.  Egg - 1 egg has 6 g of protein.  Fish, filet (cooked) - 1 oz has 6-7 g of protein.  Garbanzo beans (canned or cooked) - 1/2 cup has 6-7 g of protein.  Kidney beans (canned or cooked) - 1/2 cup has 6-7 g of protein.  Lamb (cooked) - 3 oz has 24 g of protein.  Milk - 1 cup (8 oz) has 8 g of protein.  Nuts (peanuts, pistachios, almonds) - 1 oz has 6 g of protein.  Peanut butter - 1 oz has 7-8 g of protein.  Pork tenderloin (cooked) - 3 oz has 18.4 g of protein.  Pumpkin seeds - 1 oz has 8.5 g of protein.  Soybeans (roasted) - 1 oz has 8 g of protein.  Soybeans (cooked) - 1/2 cup has 11 g of protein.  Soy milk - 1 cup (8 oz) has 5-10 g of protein.  Soy or vegetable patty - 1 patty has 11 g of protein.  Sunflower seeds - 1 oz has 5.5 g of protein.  Tofu (firm) - 1/2 cup has 20 g of protein.  Melanie Hughes (  canned in water) - 3 oz has 20 g of protein.  Yogurt - 6 oz has 8 g of protein.  Low-protein foods Low-protein foods contain 3 grams (3 g) or less of protein per serving. They include:  Beets (raw or cooked) - 1/2 cup has 1.5 g of protein.  Bran cereal - 1/2 cup has 2-3 g of protein.  Bread - 1 slice has 2.5 g of protein.  Broccoli (raw or cooked) - 1/2 cup has 2 g of protein.  Collard greens (raw or cooked) - 1/2 cup has 2 g of protein.  Corn (fresh or cooked) - 1/2 cup has 2 g of protein.  Cream cheese - 1 oz has 2 g of protein.  Creamer (half-and-half) - 1 oz has 1 g of protein.  Flour tortilla - 1 tortilla has 2.5 g of protein  Frozen yogurt - 1/2 cup has 3 g of protein.  Fruit or vegetable juice - 1/2 cup has 1 g of protein.  Green beans (raw or cooked)  - 1/2 cup has 1 g of protein.  Green peas (canned) - 1/2 cup has 3.5 g of protein.  Muffins - 1 small muffin (2 oz) has 3 g of protein.  Oatmeal (cooked) - 1/2 cup has 3 g of protein.  Potato (baked with skin) - 1 medium potato has 3 g of protein.  Rice (cooked) - 1/2 cup has 2.5-3.5 g of protein.  Sour cream - 1/2 cup has 2.5 g of protein.  Spinach (cooked) - 1/2 cup has 3 g of protein.  Squash (cooked) - 1/2 cup has 1.5 g of protein.  Actual amounts of protein may be different depending on processing. Talk with your health care provider or dietitian about what foods are recommended for you. This information is not intended to replace advice given to you by your health care provider. Make sure you discuss any questions you have with your health care provider. Document Released: 09/20/2015 Document Revised: 03/01/2016 Document Reviewed: 03/01/2016 Elsevier Interactive Patient Education  [employer].  It is important to avoid accidents which may result in broken bones.  Here are a few ideas on how to make your home safer so you will be less likely to trip or fall.  1. Use nonskid mats or non slip strips in your shower or tub, on your bathroom floor and around sinks.  If you know that you have spilled water, wipe it up! 2. In the bathroom, it is important to have properly installed grab bars on the walls or on the edge of the tub.  Towel racks are NOT strong enough for you to hold onto or to pull on for support. 3. Stairs and hallways should have enough light.  Add lamps or night lights if you need ore light. 4. It is good to have handrails on both sides of the stairs if possible.  Always fix broken handrails right away. 5. It is important to see the edges of steps.  Paint the edges of outdoor steps white so you can see them better.  Put colored tape on the edge of inside steps. 6. Throw-rugs are dangerous because they can slide.  Removing the rugs is the best idea, but if they  must stay, add adhesive carpet tape to prevent slipping. 7. Do not keep things on stairs or in the halls.  Remove small furniture that blocks the halls as it may cause you to trip.  Keep telephone and electrical cords out of the  way where you walk. 8. Always were sturdy, rubber-soled shoes for good support.  Never wear just socks, especially on the stairs.  Socks may cause you to slip or fall.  Do not wear full-length housecoats as you can easily trip on the bottom.  9. Place the things you use the most on the shelves that are the easiest to reach.  If you use a stepstool, make sure it is in good condition.  If you feel unsteady, DO NOT climb, ask for help. 10. If a health professional advises you to use a cane or walker, do not be ashamed.  These items can keep you from falling and breaking your bones.  Health Maintenance, Female Adopting a healthy lifestyle and getting preventive care can go a long way to promote health and wellness. Talk with your health care provider about what schedule of regular examinations is right for you. This is a good chance for you to check in with your provider about disease prevention and staying healthy. In between checkups, there are plenty of things you can do on your own. Experts have done a lot of research about which lifestyle changes and preventive measures are most likely to keep you healthy. Ask your health care provider for more information. Weight and diet Eat a healthy diet  Be sure to include plenty of vegetables, fruits, low-fat dairy products, and lean protein.  Do not eat a lot of foods high in solid fats, added sugars, or salt.  Get regular exercise. This is one of the most important things you can do for your health. ? Most adults should exercise for at least 150 minutes each week. The exercise should increase your heart rate and make you sweat (moderate-intensity exercise). ? Most adults should also do strengthening exercises at least twice a week.  This is in addition to the moderate-intensity exercise.  Maintain a healthy weight  Body mass index (BMI) is a measurement that can be used to identify possible weight problems. It estimates body fat based on height and weight. Your health care provider can help determine your BMI and help you achieve or maintain a healthy weight.  For females 57 years of age and older: ? A BMI below 18.5 is considered underweight. ? A BMI of 18.5 to 24.9 is normal. ? A BMI of 25 to 29.9 is considered overweight. ? A BMI of 30 and above is considered obese.  Watch levels of cholesterol and blood lipids  You should start having your blood tested for lipids and cholesterol at 80 years of age, then have this test every 5 years.  You may need to have your cholesterol levels checked more often if: ? Your lipid or cholesterol levels are high. ? You are older than 80 years of age. ? You are at high risk for heart disease.  Cancer screening Lung Cancer  Lung cancer screening is recommended for adults 3-45 years old who are at high risk for lung cancer because of a history of smoking.  A yearly low-dose CT scan of the lungs is recommended for people who: ? Currently smoke. ? Have quit within the past 15 years. ? Have at least a 30-pack-year history of smoking. A pack year is smoking an average of one pack of cigarettes a day for 1 year.  Yearly screening should continue until it has been 15 years since you quit.  Yearly screening should stop if you develop a health problem that would prevent you from having lung cancer treatment.  Breast Cancer  Practice breast self-awareness. This means understanding how your breasts normally appear and feel.  It also means doing regular breast self-exams. Let your health care provider know about any changes, no matter how small.  If you are in your 20s or 30s, you should have a clinical breast exam (CBE) by a health care provider every 1-3 years as part of a  regular health exam.  If you are 60 or older, have a CBE every year. Also consider having a breast X-ray (mammogram) every year.  If you have a family history of breast cancer, talk to your health care provider about genetic screening.  If you are at high risk for breast cancer, talk to your health care provider about having an MRI and a mammogram every year.  Breast cancer gene (BRCA) assessment is recommended for women who have family members with BRCA-related cancers. BRCA-related cancers include: ? Breast. ? Ovarian. ? Tubal. ? Peritoneal cancers.  Results of the assessment will determine the need for genetic counseling and BRCA1 and BRCA2 testing.  Cervical Cancer Your health care provider may recommend that you be screened regularly for cancer of the pelvic organs (ovaries, uterus, and vagina). This screening involves a pelvic examination, including checking for microscopic changes to the surface of your cervix (Pap test). You may be encouraged to have this screening done every 3 years, beginning at age 32.  For women ages 47-65, health care providers may recommend pelvic exams and Pap testing every 3 years, or they may recommend the Pap and pelvic exam, combined with testing for human papilloma virus (HPV), every 5 years. Some types of HPV increase your risk of cervical cancer. Testing for HPV may also be done on women of any age with unclear Pap test results.  Other health care providers may not recommend any screening for nonpregnant women who are considered low risk for pelvic cancer and who do not have symptoms. Ask your health care provider if a screening pelvic exam is right for you.  If you have had past treatment for cervical cancer or a condition that could lead to cancer, you need Pap tests and screening for cancer for at least 20 years after your treatment. If Pap tests have been discontinued, your risk factors (such as having a new sexual partner) need to be reassessed to  determine if screening should resume. Some women have medical problems that increase the chance of getting cervical cancer. In these cases, your health care provider may recommend more frequent screening and Pap tests.  Colorectal Cancer  This type of cancer can be detected and often prevented.  Routine colorectal cancer screening usually begins at 80 years of age and continues through 80 years of age.  Your health care provider may recommend screening at an earlier age if you have risk factors for colon cancer.  Your health care provider may also recommend using home test kits to check for hidden blood in the stool.  A small camera at the end of a tube can be used to examine your colon directly (sigmoidoscopy or colonoscopy). This is done to check for the earliest forms of colorectal cancer.  Routine screening usually begins at age 51.  Direct examination of the colon should be repeated every 5-10 years through 80 years of age. However, you may need to be screened more often if early forms of precancerous polyps or small growths are found.  Skin Cancer  Check your skin from head to toe regularly.  Tell  your health care provider about any new moles or changes in moles, especially if there is a change in a mole's shape or color.  Also tell your health care provider if you have a mole that is larger than the size of a pencil eraser.  Always use sunscreen. Apply sunscreen liberally and repeatedly throughout the day.  Protect yourself by wearing long sleeves, pants, a wide-brimmed hat, and sunglasses whenever you are outside.  Heart disease, diabetes, and high blood pressure  High blood pressure causes heart disease and increases the risk of stroke. High blood pressure is more likely to develop in: ? People who have blood pressure in the high end of the normal range (130-139/85-89 mm Hg). ? People who are overweight or obese. ? People who are African American.  If you are 18-39 years  of age, have your blood pressure checked every 3-5 years. If you are 51 years of age or older, have your blood pressure checked every year. You should have your blood pressure measured twice-once when you are at a hospital or clinic, and once when you are not at a hospital or clinic. Record the average of the two measurements. To check your blood pressure when you are not at a hospital or clinic, you can use: ? An automated blood pressure machine at a pharmacy. ? A home blood pressure monitor.  If you are between 55 years and 19 years old, ask your health care provider if you should take aspirin to prevent strokes.  Have regular diabetes screenings. This involves taking a blood sample to check your fasting blood sugar level. ? If you are at a normal weight and have a low risk for diabetes, have this test once every three years after 80 years of age. ? If you are overweight and have a high risk for diabetes, consider being tested at a younger age or more often. Preventing infection Hepatitis B  If you have a higher risk for hepatitis B, you should be screened for this virus. You are considered at high risk for hepatitis B if: ? You were born in a country where hepatitis B is common. Ask your health care provider which countries are considered high risk. ? Your parents were born in a high-risk country, and you have not been immunized against hepatitis B (hepatitis B vaccine). ? You have HIV or AIDS. ? You use needles to inject street drugs. ? You live with someone who has hepatitis B. ? You have had sex with someone who has hepatitis B. ? You get hemodialysis treatment. ? You take certain medicines for conditions, including cancer, organ transplantation, and autoimmune conditions.  Hepatitis C  Blood testing is recommended for: ? Everyone born from [birth year] through 1963. ? Anyone with known risk factors for hepatitis C.  Sexually transmitted infections (STIs)  You should be screened for  sexually transmitted infections (STIs) including gonorrhea and chlamydia if: ? You are sexually active and are younger than 80 years of age. ? You are older than 80 years of age and your health care provider tells you that you are at risk for this type of infection. ? Your sexual activity has changed since you were last screened and you are at an increased risk for chlamydia or gonorrhea. Ask your health care provider if you are at risk.  If you do not have HIV, but are at risk, it may be recommended that you take a prescription medicine daily to prevent HIV infection. This is called pre-exposure  prophylaxis (PrEP). You are considered at risk if: ? You are sexually active and do not regularly use condoms or know the HIV status of your partner(s). ? You take drugs by injection. ? You are sexually active with a partner who has HIV.  Talk with your health care provider about whether you are at high risk of being infected with HIV. If you choose to begin PrEP, you should first be tested for HIV. You should then be tested every 3 months for as long as you are taking PrEP. Pregnancy  If you are premenopausal and you may become pregnant, ask your health care provider about preconception counseling.  If you may become pregnant, take 400 to 800 micrograms (mcg) of folic acid every day.  If you want to prevent pregnancy, talk to your health care provider about birth control (contraception). Osteoporosis and menopause  Osteoporosis is a disease in which the bones lose minerals and strength with aging. This can result in serious bone fractures. Your risk for osteoporosis can be identified using a bone density scan.  If you are 33 years of age or older, or if you are at risk for osteoporosis and fractures, ask your health care provider if you should be screened.  Ask your health care provider whether you should take a calcium or vitamin D supplement to lower your risk for osteoporosis.  Menopause may  have certain physical symptoms and risks.  Hormone replacement therapy may reduce some of these symptoms and risks. Talk to your health care provider about whether hormone replacement therapy is right for you. Follow these instructions at home:  Schedule regular health, dental, and eye exams.  Stay current with your immunizations.  Do not use any tobacco products including cigarettes, chewing tobacco, or electronic cigarettes.  If you are pregnant, do not drink alcohol.  If you are breastfeeding, limit how much and how often you drink alcohol.  Limit alcohol intake to no more than 1 drink per day for nonpregnant women. One drink equals 12 ounces of beer, 5 ounces of [place], or 1 ounces of hard liquor.  Do not use street drugs.  Do not share needles.  Ask your health care provider for help if you need support or information about quitting drugs.  Tell your health care provider if you often feel depressed.  Tell your health care provider if you have ever been abused or do not feel safe at home. This information is not intended to replace advice given to you by your health care provider. Make sure you discuss any questions you have with your health care provider. Document Released: 01/04/2011 Document Revised: 11/27/2015 Document Reviewed: 03/25/2015 Elsevier Interactive Patient Education  [employer].

## 2018-02-17 ENCOUNTER — Ambulatory Visit (INDEPENDENT_AMBULATORY_CARE_PROVIDER_SITE_OTHER): Payer: Medicare Other | Admitting: Podiatry

## 2018-02-17 ENCOUNTER — Encounter: Payer: Self-pay | Admitting: Podiatry

## 2018-02-17 VITALS — BP 137/67 | HR 66

## 2018-02-17 DIAGNOSIS — E1142 Type 2 diabetes mellitus with diabetic polyneuropathy: Secondary | ICD-10-CM | POA: Diagnosis not present

## 2018-02-17 DIAGNOSIS — M205X1 Other deformities of toe(s) (acquired), right foot: Secondary | ICD-10-CM

## 2018-02-17 DIAGNOSIS — L84 Corns and callosities: Secondary | ICD-10-CM

## 2018-02-17 NOTE — Progress Notes (Signed)
This patient presents to the office with chief complaint of painful corn second toe right foot due to her bunion right foot.  She is also a diabetic.  This patient  says there  is  no pain and discomfort in her  Feet except for corn second toe..  This patient says the corn and toe are painful walking and wearing her shoes.  Patient has no history of infection or drainage from both feet.  Patient is unable to  self treat her corn.   . This patient presents  to the office today for treatment of the painful corn second toe right foot. and a foot evaluation due to history of  Diabetes.Patient is taking coumadin.  General Appearance  Alert, conversant and in no acute stress.  Vascular  Dorsalis pedis and posterior tibial  pulses are palpable  bilaterally.  Capillary return is within normal limits  bilaterally. Temperature is within normal limits  bilaterally.  Neurologic  Senn-Weinstein monofilament wire test diminished   bilaterally. Muscle power within normal limits bilaterally.  Nails Normal nails noted with no evidence of bacterial or fungal infection.  Orthopedic  No limitations of motion of motion feet .  No crepitus or effusions noted.  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  Skin  normotropic skin with no porokeratosis noted bilaterally.  No signs of infections or ulcers noted.  Corn second toe right foot at level of PIPJ medially.   Corn second toe right due to positioning of big toe joint right foot.  Diabetes with neuropathy  IE  Debride nails x 10.  A diabetic foot exam was performed and there is no evidence of any vascular  pathology.  Diminished LOPS noted on exam.  Padding dispensed.  Told her she qualifies for diabetic shoes and she can make an appointment with Liliane Channel.  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  RTC 3 months.   Gardiner Barefoot DPM

## 2018-02-27 ENCOUNTER — Encounter: Payer: Self-pay | Admitting: Family

## 2018-04-13 ENCOUNTER — Other Ambulatory Visit: Payer: Self-pay | Admitting: Family Medicine

## 2018-04-19 ENCOUNTER — Other Ambulatory Visit: Payer: Self-pay | Admitting: Family

## 2018-04-19 ENCOUNTER — Telehealth: Payer: Self-pay

## 2018-04-19 MED ORDER — SIMVASTATIN 10 MG PO TABS: 10.0000 mg | ORAL_TABLET | Freq: Every day | ORAL | 1 refills | Status: DC

## 2018-04-20 NOTE — Telephone Encounter (Signed)
This has been taken care of today.

## 2018-04-26 ENCOUNTER — Other Ambulatory Visit: Payer: Self-pay | Admitting: Family

## 2018-04-26 ENCOUNTER — Telehealth: Payer: Self-pay

## 2018-04-26 MED ORDER — PIOGLITAZONE HCL 30 MG PO TABS: 30.0000 mg | ORAL_TABLET | Freq: Every day | ORAL | 1 refills | Status: DC

## 2018-04-28 ENCOUNTER — Other Ambulatory Visit: Payer: Self-pay | Admitting: Family

## 2018-04-28 MED ORDER — ZOSTER VAC RECOMB ADJUVANTED 50 MCG/0.5ML IM SUSR: 0.5000 mL | Freq: Once | INTRAMUSCULAR | 1 refills | Status: AC

## 2018-05-09 ENCOUNTER — Other Ambulatory Visit: Payer: Self-pay

## 2018-05-09 MED ORDER — VITAMIN D (ERGOCALCIFEROL) 1.25 MG (50000 UNIT) PO CAPS: 50000.0000 [IU] | ORAL_CAPSULE | ORAL | 0 refills | Status: DC

## 2018-05-12 NOTE — Telephone Encounter (Signed)
Completed.

## 2018-05-17 ENCOUNTER — Other Ambulatory Visit: Payer: Self-pay | Admitting: Family

## 2018-05-17 MED ORDER — LORAZEPAM 0.5 MG PO TABS: 0.5000 mg | ORAL_TABLET | Freq: Three times a day (TID) | ORAL | 0 refills | Status: DC | PRN

## 2018-05-25 ENCOUNTER — Encounter: Payer: Self-pay | Admitting: Family

## 2018-05-25 ENCOUNTER — Other Ambulatory Visit (INDEPENDENT_AMBULATORY_CARE_PROVIDER_SITE_OTHER): Payer: Medicare Other

## 2018-05-25 ENCOUNTER — Ambulatory Visit: Payer: Medicare Other | Admitting: Family

## 2018-05-25 ENCOUNTER — Other Ambulatory Visit: Payer: Medicare Other

## 2018-05-25 VITALS — BP 110/68 | HR 60 | Temp 97.6°F | Ht 66.0 in | Wt 138.1 lb

## 2018-05-25 DIAGNOSIS — E119 Type 2 diabetes mellitus without complications: Secondary | ICD-10-CM | POA: Diagnosis not present

## 2018-05-25 DIAGNOSIS — R5383 Other fatigue: Secondary | ICD-10-CM

## 2018-05-25 DIAGNOSIS — R300 Dysuria: Secondary | ICD-10-CM

## 2018-05-25 LAB — COMPREHENSIVE METABOLIC PANEL
ALBUMIN: 4.2 g/dL (ref 3.5–5.2)
ALT: 10 U/L (ref 0–35)
AST: 14 U/L (ref 0–37)
Alkaline Phosphatase: 40 U/L (ref 39–117)
BILIRUBIN TOTAL: 0.4 mg/dL (ref 0.2–1.2)
BUN: 31 mg/dL — AB (ref 6–23)
CHLORIDE: 101 meq/L (ref 96–112)
CO2: 28 meq/L (ref 19–32)
CREATININE: 1.02 mg/dL (ref 0.40–1.20)
Calcium: 9.3 mg/dL (ref 8.4–10.5)
GFR: 55.32 mL/min — ABNORMAL LOW (ref >60.00)
Glucose, Bld: 157 mg/dL — ABNORMAL HIGH (ref 70–99)
Potassium: 4.7 mEq/L (ref 3.5–5.1)
SODIUM: 137 meq/L (ref 135–145)
Total Protein: 6.7 g/dL (ref 6.0–8.3)

## 2018-05-25 LAB — POC URINALSYSI DIPSTICK (AUTOMATED)
Bilirubin, UA: NEGATIVE
Blood, UA: NEGATIVE
Glucose, UA: NEGATIVE
Ketones, UA: NEGATIVE
Leukocytes, UA: NEGATIVE
Nitrite, UA: NEGATIVE
Protein, UA: NEGATIVE
SPEC GRAV UA: 1.02 (ref 1.010–1.025)
UROBILINOGEN UA: 0.2 U/dL
pH, UA: 6 (ref 5.0–8.0)

## 2018-05-25 LAB — CBC WITH DIFFERENTIAL/PLATELET
BASOS ABS: 0 10*3/uL (ref 0.0–0.1)
BASOS PCT: 0.5 % (ref 0.0–3.0)
EOS ABS: 0 10*3/uL (ref 0.0–0.7)
Eosinophils Relative: 0.6 % (ref 0.0–5.0)
HCT: 35.1 % — ABNORMAL LOW (ref 36.0–46.0)
Hemoglobin: 11.7 g/dL — ABNORMAL LOW (ref 12.0–15.0)
Lymphocytes Relative: 11 % — ABNORMAL LOW (ref 12.0–46.0)
Lymphs Abs: 0.8 10*3/uL (ref 0.7–4.0)
MCHC: 33.2 g/dL (ref 30.0–36.0)
MCV: 90.1 fl (ref 78.0–100.0)
MONO ABS: 0.5 10*3/uL (ref 0.1–1.0)
Monocytes Relative: 6.1 % (ref 3.0–12.0)
NEUTROS ABS: 6.1 10*3/uL (ref 1.4–7.7)
Neutrophils Relative %: 81.8 % — ABNORMAL HIGH (ref 43.0–77.0)
PLATELETS: 193 10*3/uL (ref 150.0–400.0)
RBC: 3.9 Mil/uL (ref 3.87–5.11)
RDW: 13.7 % (ref 11.5–15.5)
WBC: 7.5 10*3/uL (ref 4.0–10.5)

## 2018-05-25 LAB — TSH: TSH: 0.94 u[IU]/mL (ref 0.35–4.50)

## 2018-05-25 LAB — HEMOGLOBIN A1C: Hgb A1c MFr Bld: 7.8 % — ABNORMAL HIGH (ref 4.6–6.5)

## 2018-05-25 LAB — VITAMIN B12: VITAMIN B 12: 192 pg/mL — AB (ref 211–911)

## 2018-05-25 MED ORDER — FLUTICASONE PROPIONATE 50 MCG/ACT NA SUSP: 2.0000 | Freq: Every day | NASAL | 6 refills | Status: DC

## 2018-05-25 NOTE — Progress Notes (Signed)
Melanie Hughes is a 82 y.o. female with the following history as recorded in EpicCare:  Patient Active Problem List   Diagnosis Date Noted  . Chondroma 10/14/2017  . Normocytic anemia 06/29/2017  . Hx of breast cancer 06/29/2017  . Hypomagnesemia 06/29/2017  . Hyperlipidemia 06/29/2017  . Diabetes mellitus without complication ([place]) 36/64/4034  . Hypertension 06/28/2017  . Closed left hip fracture, initial encounter ([place]) 06/28/2017  . Closed fracture of left olecranon process 06/28/2017  . Anxiety 06/28/2017  . Closed displaced fracture of greater trochanter of left femur (HCC)     Current Outpatient Medications  Medication Sig Dispense Refill  . aspirin EC 81 MG tablet Take 81 mg by mouth daily.    . bisacodyl (DULCOLAX) 5 MG EC tablet Take 1 tablet (5 mg total) by mouth daily as needed for moderate constipation. 30 tablet 0  . blood glucose meter kit and supplies KIT Use up to 4x daily as directed---diagnosis code E11.9 1 each 0  . Calcium Carb-Cholecalciferol (CALCIUM 500+D PO) Calcium 500 + D    . CONTOUR NEXT TEST test strip   1  . escitalopram (LEXAPRO) 10 MG tablet Take 10 mg by mouth at bedtime.    . fluticasone (FLONASE) 50 MCG/ACT nasal spray Place 2 sprays into both nostrils daily. 16 g 6  . glipiZIDE (GLUCOTROL) 5 MG tablet TAKE 1 TABLET (5 MG) BY MOUTH 2 TIMES PER DAY 30 MINUTES BEFORE MEALS  3  . lisinopril-hydrochlorothiazide (PRINZIDE,ZESTORETIC) 20-12.5 MG tablet Take 1 tablet by mouth every morning.  3  . LITE TOUCH LANCETS MISC   1  . LORazepam (ATIVAN) 0.5 MG tablet Take 1 tablet (0.5 mg total) by mouth every 8 (eight) hours as needed for anxiety. 90 tablet 0  . metFORMIN (GLUCOPHAGE) 1000 MG tablet Take 1 tablet (1,000 mg total) by mouth 2 (two) times daily.    . Multiple Vitamin (MULTIVITAMIN WITH MINERALS) TABS tablet Take 1 tablet by mouth daily.    . Multiple Vitamins-Minerals (PRESERVISION AREDS 2+MULTI VIT) CAPS Take 2 capsules by mouth daily.    .  pioglitazone (ACTOS) 30 MG tablet Take 1 tablet (30 mg total) by mouth daily. 90 tablet 1  . simvastatin (ZOCOR) 10 MG tablet Take 1 tablet (10 mg total) by mouth daily. 90 tablet 1  . UNABLE TO FIND nitrofurantoin monohydrate/macrocrystals 100 mg capsule    . Vitamin D, Ergocalciferol, (DRISDOL) 50000 units CAPS capsule Take 1 capsule (50,000 Units total) by mouth every 7 (seven) days. 12 capsule 0   No current facility-administered medications for this visit.     Allergies: Morphine and related and Penicillins  Past Medical History:  Diagnosis Date  . Cancer ([place])    breast  . Diabetes mellitus without complication ([place])   . High blood cholesterol   . Hypertension     Past Surgical History:  Procedure Laterality Date  . ABDOMINAL HYSTERECTOMY    . JOINT REPLACEMENT    . MASTECTOMY    . ORIF ELBOW FRACTURE Left 07/01/2017   Procedure: OPEN REDUCTION INTERNAL FIXATION (ORIF) ELBOW/OLECRANON FRACTURE;  Surgeon: Shona Needles, MD;  Location: [facility];  Service: Orthopedics;  Laterality: Left;  . WRIST SURGERY      Family History  Problem Relation Age of Onset  . Obesity Grandchild     Social History   Tobacco Use  . Smoking status: Never Smoker  . Smokeless tobacco: Never Used  Substance Use Topics  . Alcohol use: No    Frequency:  Never    Subjective:   Accompanied by her 2 daughters, son; having increased problems with fatigue/ lack of motivation/ more anxious recently; would also like to do her diabetes check while here today; notes that overall, she is just not feeling well; also wants to make sure she does not have an underlying UTI;   Objective:  Vitals:   05/25/18 1350  BP: 110/68  Pulse: 60  Temp: 97.6 F (36.4 C)  TempSrc: Oral  SpO2: 95%  Weight: 138 lb 1.9 oz (62.7 kg)  Height: 5' 6" (1.676 m)    General: Well developed, well nourished, in no acute distress  Skin : Warm and dry.  Head: Normocephalic and atraumatic  Eyes: Sclera and conjunctiva clear;  pupils round and reactive to light; extraocular movements intact  Ears: External normal; canals clear; tympanic membranes normal  Oropharynx: Pink, supple. No suspicious lesions  Neck: Supple without thyromegaly, adenopathy  Lungs: Respirations unlabored; clear to auscultation bilaterally without wheeze, rales, rhonchi  CVS exam: normal rate and regular rhythm.  Musculoskeletal: No deformities; no active joint inflammation  Extremities: No edema, cyanosis, clubbing  Vessels: Symmetric bilaterally  Neurologic: Alert and oriented; speech intact; face symmetrical; moves all extremities well; CNII-XII intact without focal deficit   Assessment:  1. Dysuria   2. Other fatigue   3. Diabetes mellitus without complication ([place])     Plan:  1. Check U/A and urine culture today;  2. Suspect underlying anxiety- will most likely plan to increase Lexapro to 20 mg daily; will also plan to have patient start taking Ativan 0.5 mg bid;  3. Update labs today- encouraged exercise;   No follow-ups on file.  Orders Placed This Encounter  Procedures  . Urine Culture    Standing Status:   Future    Standing Expiration Date:   05/25/2019  . CBC w/Diff    Standing Status:   Future    Number of Occurrences:   1    Standing Expiration Date:   05/25/2019  . Comp Met (CMET)    Standing Status:   Future    Number of Occurrences:   1    Standing Expiration Date:   05/25/2019  . TSH    Standing Status:   Future    Number of Occurrences:   1    Standing Expiration Date:   05/25/2019  . HgB A1c    Standing Status:   Future    Number of Occurrences:   1    Standing Expiration Date:   05/25/2019  . B12    Standing Status:   Future    Number of Occurrences:   1    Standing Expiration Date:   05/25/2019  . POCT Urinalysis Dipstick (Automated)    Requested Prescriptions   Signed Prescriptions Disp Refills  . fluticasone (FLONASE) 50 MCG/ACT nasal spray 16 g 6    Sig: Place 2 sprays into both nostrils  daily.

## 2018-05-27 LAB — URINE CULTURE
MICRO NUMBER:: 91405183
SPECIMEN QUALITY: ADEQUATE

## 2018-05-30 ENCOUNTER — Other Ambulatory Visit: Payer: Self-pay | Admitting: Family

## 2018-05-30 MED ORDER — CYANOCOBALAMIN 1000 MCG/ML IJ SOLN: 1000.0000 ug | INTRAMUSCULAR | 0 refills | Status: AC

## 2018-06-19 ENCOUNTER — Other Ambulatory Visit: Payer: Self-pay | Admitting: Family

## 2018-06-19 MED ORDER — LORAZEPAM 0.5 MG PO TABS: ORAL_TABLET | ORAL | 1 refills | Status: DC

## 2018-06-20 ENCOUNTER — Other Ambulatory Visit: Payer: Self-pay | Admitting: Family

## 2018-07-03 ENCOUNTER — Telehealth: Payer: Self-pay | Admitting: Family

## 2018-07-03 NOTE — Telephone Encounter (Signed)
Copied from [place] [id number]. Topic: Referral - Request for Referral >> Jul 03, 2018  9:41 AM Yvette Rack wrote: Has patient seen PCP for this complaint? Yes.  Son Merry Proud states that they have somewhat discussed this pt has an appt with Valere Dross *If NO, is insurance requiring patient see PCP for this issue before PCP can refer them? Referral for which specialty: Neurologist  Preferred provider/office: Dr Volanda Napoleon in [place] [phone] this is who her son sees Reason for referral: Parkinson and dementia

## 2018-07-10 ENCOUNTER — Other Ambulatory Visit: Payer: Self-pay | Admitting: Family

## 2018-07-10 DIAGNOSIS — F0390 Unspecified dementia without behavioral disturbance: Secondary | ICD-10-CM

## 2018-07-11 ENCOUNTER — Encounter: Payer: Self-pay | Admitting: Family

## 2018-07-11 ENCOUNTER — Ambulatory Visit: Payer: Medicare Other | Admitting: Family

## 2018-07-11 ENCOUNTER — Other Ambulatory Visit (INDEPENDENT_AMBULATORY_CARE_PROVIDER_SITE_OTHER): Payer: Medicare Other

## 2018-07-11 VITALS — BP 110/70 | HR 75 | Temp 97.6°F | Resp 18 | Ht 66.0 in | Wt 137.1 lb

## 2018-07-11 DIAGNOSIS — E538 Deficiency of other specified B group vitamins: Secondary | ICD-10-CM

## 2018-07-11 DIAGNOSIS — F0390 Unspecified dementia without behavioral disturbance: Secondary | ICD-10-CM

## 2018-07-11 DIAGNOSIS — F419 Anxiety disorder, unspecified: Secondary | ICD-10-CM | POA: Diagnosis not present

## 2018-07-11 DIAGNOSIS — H6122 Impacted cerumen, left ear: Secondary | ICD-10-CM | POA: Diagnosis not present

## 2018-07-11 LAB — VITAMIN B12: Vitamin B-12: 437 pg/mL (ref 211–911)

## 2018-07-11 MED ORDER — ESCITALOPRAM OXALATE 20 MG PO TABS: 20.0000 mg | ORAL_TABLET | Freq: Every day | ORAL | 1 refills | Status: DC

## 2018-07-11 NOTE — Progress Notes (Signed)
Melanie Hughes is a 83 y.o. female with the following history as recorded in EpicCare:  Patient Active Problem List   Diagnosis Date Noted  . Chondroma 10/14/2017  . Normocytic anemia 06/29/2017  . Hx of breast cancer 06/29/2017  . Hypomagnesemia 06/29/2017  . Hyperlipidemia 06/29/2017  . Diabetes mellitus without complication ([place]) 10/93/2355  . Hypertension 06/28/2017  . Closed left hip fracture, initial encounter ([place]) 06/28/2017  . Closed fracture of left olecranon process 06/28/2017  . Anxiety 06/28/2017  . Closed displaced fracture of greater trochanter of left femur (HCC)     Current Outpatient Medications  Medication Sig Dispense Refill  . aspirin EC 81 MG tablet Take 81 mg by mouth daily.    . bisacodyl (DULCOLAX) 5 MG EC tablet Take 1 tablet (5 mg total) by mouth daily as needed for moderate constipation. 30 tablet 0  . blood glucose meter kit and supplies KIT Use up to 4x daily as directed---diagnosis code E11.9 1 each 0  . Calcium Carb-Cholecalciferol (CALCIUM 500+D PO) Calcium 500 + D    . CONTOUR NEXT TEST test strip   1  . escitalopram (LEXAPRO) 20 MG tablet Take 1 tablet (20 mg total) by mouth at bedtime. 90 tablet 1  . fluticasone (FLONASE) 50 MCG/ACT nasal spray Place 2 sprays into both nostrils daily. 16 g 6  . glipiZIDE (GLUCOTROL) 5 MG tablet TAKE 1 TABLET (5 MG) BY MOUTH 2 TIMES PER DAY 30 MINUTES BEFORE MEALS  3  . lisinopril-hydrochlorothiazide (PRINZIDE,ZESTORETIC) 20-12.5 MG tablet Take 1 tablet by mouth every morning.  3  . LITE TOUCH LANCETS MISC   1  . LORazepam (ATIVAN) 0.5 MG tablet Take 2 tablets in the am; take 1 in the pm 90 tablet 1  . metFORMIN (GLUCOPHAGE) 1000 MG tablet Take 1 tablet (1,000 mg total) by mouth 2 (two) times daily.    . Multiple Vitamin (MULTIVITAMIN WITH MINERALS) TABS tablet Take 1 tablet by mouth daily.    . Multiple Vitamins-Minerals (PRESERVISION AREDS 2+MULTI VIT) CAPS Take 2 capsules by mouth daily.    . pioglitazone (ACTOS)  30 MG tablet Take 1 tablet (30 mg total) by mouth daily. 90 tablet 1  . simvastatin (ZOCOR) 10 MG tablet Take 1 tablet (10 mg total) by mouth daily. 90 tablet 1  . UNABLE TO FIND nitrofurantoin monohydrate/macrocrystals 100 mg capsule    . Vitamin D, Ergocalciferol, (DRISDOL) 50000 units CAPS capsule Take 1 capsule (50,000 Units total) by mouth every 7 (seven) days. 12 capsule 0   No current facility-administered medications for this visit.     Allergies: Morphine and related and Penicillins  Past Medical History:  Diagnosis Date  . Cancer ([place])    breast  . Diabetes mellitus without complication ([place])   . High blood cholesterol   . Hypertension     Past Surgical History:  Procedure Laterality Date  . ABDOMINAL HYSTERECTOMY    . JOINT REPLACEMENT    . MASTECTOMY    . ORIF ELBOW FRACTURE Left 07/01/2017   Procedure: OPEN REDUCTION INTERNAL FIXATION (ORIF) ELBOW/OLECRANON FRACTURE;  Surgeon: Shona Needles, MD;  Location: [facility];  Service: Orthopedics;  Laterality: Left;  . WRIST SURGERY      Family History  Problem Relation Age of Onset  . Obesity Grandchild     Social History   Tobacco Use  . Smoking status: Never Smoker  . Smokeless tobacco: Never Used  Substance Use Topics  . Alcohol use: No    Frequency: Never  Subjective:  Patient is accompanied by both of her daughters; at last OV, B12 level was found to be low- started on weekly injections x 4 weeks; needs to get level updated today; per daughter, has noted that her mother does not seem as tired; Patient and family are in agreement for need for neurologic evaluation- concern for dementia/ parkinson's; walks with shuffled gate/ does have some tremors; referral was updated to MD requested in [place], [place]; Patient continues to struggle with anxiety- especially problematic on days when she has to leave her house; notes she really prefers just to stay home; taking 2 Ativan in the am and 1 in the pm; only taking 10 mg of  Lexapro;  Also mentions that has felt more dizzy recently- felt like she almost lost her balance yesterday when she stopped to pick something up out of her yard;     Objective:  Vitals:   07/11/18 1038  BP: 110/70  Pulse: 75  Resp: 18  Temp: 97.6 F (36.4 C)  TempSrc: Oral  Weight: 137 lb 1.9 oz (62.2 kg)  Height: '5\' 6"'$  (1.676 m)    General: Well developed, well nourished, in no acute distress  Skin : Warm and dry.  Head: Normocephalic and atraumatic  Eyes: Sclera and conjunctiva clear; pupils round and reactive to light; extraocular movements intact  Ears: External normal; after lavage, canals clear; tympanic membranes normal  Oropharynx: Pink, supple. No suspicious lesions  Neck: Supple without thyromegaly, adenopathy  Lungs: Respirations unlabored; clear to auscultation bilaterally without wheeze, rales, rhonchi  CVS exam: normal rate and regular rhythm.  Neurologic: Alert and oriented; speech intact; face symmetrical; shuffles her feet while walking;  CNII-XII intact without focal deficit   Assessment:  1. B12 deficiency   2. Impacted cerumen of left ear   3. Anxiety   4. Dementia without behavioral disturbance, unspecified dementia type ([place])     Plan:  1. Update B12 level today- if normalized, will plan to have patient take daily OTC B12 supplement; re-check in 1 month; 2. Ear lavage completed with no difficulty; 3. Increase Lexapro to 20 mg daily; 4. Referral to neurology has been updated as requested.   No follow-ups on file.  Orders Placed This Encounter  Procedures  . B12    Standing Status:   Future    Number of Occurrences:   1    Standing Expiration Date:   07/11/2019    Requested Prescriptions   Signed Prescriptions Disp Refills  . escitalopram (LEXAPRO) 20 MG tablet 90 tablet 1    Sig: Take 1 tablet (20 mg total) by mouth at bedtime.

## 2018-07-31 ENCOUNTER — Ambulatory Visit: Payer: Medicare Other | Admitting: Family

## 2018-08-21 ENCOUNTER — Ambulatory Visit: Payer: Medicare Other | Admitting: Family

## 2018-08-25 ENCOUNTER — Ambulatory Visit: Payer: Medicare Other | Admitting: Family

## 2018-08-25 ENCOUNTER — Encounter: Payer: Self-pay | Admitting: Family

## 2018-08-25 ENCOUNTER — Other Ambulatory Visit (INDEPENDENT_AMBULATORY_CARE_PROVIDER_SITE_OTHER): Payer: Medicare Other

## 2018-08-25 VITALS — BP 108/68 | HR 68 | Temp 97.7°F | Ht 66.0 in | Wt 136.1 lb

## 2018-08-25 DIAGNOSIS — M810 Age-related osteoporosis without current pathological fracture: Secondary | ICD-10-CM

## 2018-08-25 DIAGNOSIS — R5383 Other fatigue: Secondary | ICD-10-CM

## 2018-08-25 DIAGNOSIS — E119 Type 2 diabetes mellitus without complications: Secondary | ICD-10-CM

## 2018-08-25 DIAGNOSIS — E538 Deficiency of other specified B group vitamins: Secondary | ICD-10-CM

## 2018-08-25 LAB — CBC WITH DIFFERENTIAL/PLATELET
Basophils Absolute: 0 10*3/uL (ref 0.0–0.1)
Basophils Relative: 0.5 % (ref 0.0–3.0)
EOS PCT: 1.5 % (ref 0.0–5.0)
Eosinophils Absolute: 0.1 10*3/uL (ref 0.0–0.7)
HCT: 37.1 % (ref 36.0–46.0)
Hemoglobin: 12.1 g/dL (ref 12.0–15.0)
LYMPHS ABS: 0.9 10*3/uL (ref 0.7–4.0)
Lymphocytes Relative: 14 % (ref 12.0–46.0)
MCHC: 32.7 g/dL (ref 30.0–36.0)
MCV: 89.7 fl (ref 78.0–100.0)
MONO ABS: 0.5 10*3/uL (ref 0.1–1.0)
MONOS PCT: 8 % (ref 3.0–12.0)
NEUTROS ABS: 5 10*3/uL (ref 1.4–7.7)
Neutrophils Relative %: 76 % (ref 43.0–77.0)
PLATELETS: 183 10*3/uL (ref 150.0–400.0)
RBC: 4.13 Mil/uL (ref 3.87–5.11)
RDW: 13.5 % (ref 11.5–15.5)
WBC: 6.5 10*3/uL (ref 4.0–10.5)

## 2018-08-25 LAB — HEMOGLOBIN A1C: Hgb A1c MFr Bld: 8.1 % — ABNORMAL HIGH (ref 4.6–6.5)

## 2018-08-25 LAB — VITAMIN B12: Vitamin B-12: 507 pg/mL (ref 211–911)

## 2018-08-25 MED ORDER — ESCITALOPRAM OXALATE 20 MG PO TABS: 20.0000 mg | ORAL_TABLET | Freq: Every day | ORAL | 3 refills | Status: DC

## 2018-08-25 MED ORDER — DENOSUMAB 60 MG/ML ~~LOC~~ SOSY
60.0000 mg | PREFILLED_SYRINGE | Freq: Once | SUBCUTANEOUS | Status: AC
  Administered 2018-08-25: 60 mg via SUBCUTANEOUS

## 2018-08-25 NOTE — Progress Notes (Signed)
Melanie Hughes is a 83 y.o. female with the following history as recorded in EpicCare:  Patient Active Problem List   Diagnosis Date Noted  . Chondroma 10/14/2017  . Normocytic anemia 06/29/2017  . Hx of breast cancer 06/29/2017  . Hypomagnesemia 06/29/2017  . Hyperlipidemia 06/29/2017  . Diabetes mellitus without complication ([place]) 79/89/2119  . Hypertension 06/28/2017  . Closed left hip fracture, initial encounter ([place]) 06/28/2017  . Closed fracture of left olecranon process 06/28/2017  . Anxiety 06/28/2017  . Closed displaced fracture of greater trochanter of left femur (HCC)     Current Outpatient Medications  Medication Sig Dispense Refill  . aspirin EC 81 MG tablet Take 81 mg by mouth daily.    . bisacodyl (DULCOLAX) 5 MG EC tablet Take 1 tablet (5 mg total) by mouth daily as needed for moderate constipation. 30 tablet 0  . blood glucose meter kit and supplies KIT Use up to 4x daily as directed---diagnosis code E11.9 1 each 0  . Calcium Carb-Cholecalciferol (CALCIUM 500+D PO) Calcium 500 + D    . CONTOUR NEXT TEST test strip   1  . escitalopram (LEXAPRO) 20 MG tablet Take 1 tablet (20 mg total) by mouth at bedtime. 90 tablet 3  . fluticasone (FLONASE) 50 MCG/ACT nasal spray Place 2 sprays into both nostrils daily. 16 g 6  . glipiZIDE (GLUCOTROL) 5 MG tablet TAKE 1 TABLET (5 MG) BY MOUTH 2 TIMES PER DAY 30 MINUTES BEFORE MEALS  3  . lisinopril-hydrochlorothiazide (PRINZIDE,ZESTORETIC) 20-12.5 MG tablet Take 1 tablet by mouth every morning.  3  . LITE TOUCH LANCETS MISC   1  . LORazepam (ATIVAN) 0.5 MG tablet Take 2 tablets in the am; take 1 in the pm 90 tablet 1  . metFORMIN (GLUCOPHAGE) 1000 MG tablet Take 1 tablet (1,000 mg total) by mouth 2 (two) times daily.    . Multiple Vitamin (MULTIVITAMIN WITH MINERALS) TABS tablet Take 1 tablet by mouth daily.    . Multiple Vitamins-Minerals (PRESERVISION AREDS 2+MULTI VIT) CAPS Take 2 capsules by mouth daily.    . pioglitazone (ACTOS)  30 MG tablet Take 1 tablet (30 mg total) by mouth daily. 90 tablet 1  . simvastatin (ZOCOR) 10 MG tablet Take 1 tablet (10 mg total) by mouth daily. 90 tablet 1  . UNABLE TO FIND nitrofurantoin monohydrate/macrocrystals 100 mg capsule    . Vitamin D, Ergocalciferol, (DRISDOL) 50000 units CAPS capsule Take 1 capsule (50,000 Units total) by mouth every 7 (seven) days. 12 capsule 0   No current facility-administered medications for this visit.     Allergies: Morphine and related and Penicillins  Past Medical History:  Diagnosis Date  . Cancer ([place])    breast  . Diabetes mellitus without complication ([place])   . High blood cholesterol   . Hypertension     Past Surgical History:  Procedure Laterality Date  . ABDOMINAL HYSTERECTOMY    . JOINT REPLACEMENT    . MASTECTOMY    . ORIF ELBOW FRACTURE Left 07/01/2017   Procedure: OPEN REDUCTION INTERNAL FIXATION (ORIF) ELBOW/OLECRANON FRACTURE;  Surgeon: Shona Needles, MD;  Location: [facility];  Service: Orthopedics;  Laterality: Left;  . WRIST SURGERY      Family History  Problem Relation Age of Onset  . Obesity Grandchild     Social History   Tobacco Use  . Smoking status: Never Smoker  . Smokeless tobacco: Never Used  Substance Use Topics  . Alcohol use: No    Frequency: Never  Subjective:  1 month follow-up on recent increase of Lexapro; has felt that she "had the best month I have had in a long time." Has been able to decrease amount of Ativan as well; in general, feels she is doing much better; continuing to take oral B12 supplements- less tired since treating B12 deficiency; scheduled to see neurology in April- ? Parkinson's; checks blood sugar regularly- fasting sugars below 100/ highest sugars at night averaging 190; no concerns for low blood sugar; Denies any chest pain, shortness of breath, blurred vision or headache     Objective:  Vitals:   08/25/18 1127  BP: 108/68  Pulse: 68  Temp: 97.7 F (36.5 C)  TempSrc: Oral   SpO2: 99%  Weight: 136 lb 1.3 oz (61.7 kg)  Height: 5' 6" (1.676 m)    General: Well developed, well nourished, in no acute distress  Skin : Warm and dry.  Head: Normocephalic and atraumatic  Eyes: Sclera and conjunctiva clear; pupils round and reactive to light; extraocular movements intact  Ears: External normal; canals clear; tympanic membranes normal  Oropharynx: Pink, supple. No suspicious lesions  Neck: Supple without thyromegaly, adenopathy  Lungs: Respirations unlabored; clear to auscultation bilaterally without wheeze, rales, rhonchi  CVS exam: normal rate and regular rhythm.  Neurologic: Alert and oriented; speech intact; face symmetrical; moves all extremities well; CNII-XII intact without focal deficit   Assessment:  1. Other fatigue   2. B12 deficiency   3. Diabetes mellitus without complication ([place])   4. Osteoporosis, unspecified osteoporosis type, unspecified pathological fracture presence     Plan:  1. Check CBC today; good response to Lexapro 20 mg- refill updated; follow-up in 6 months; 2. Check B12 level today; 3. Check Hgba1c today; 4. Prolia given today; repeat in 6 months; will plan to see Dr. Tamala Julian in 6 months also.  Return in about 6 months (around 02/23/2019).  Orders Placed This Encounter  Procedures  . CBC w/Diff    Standing Status:   Future    Number of Occurrences:   1    Standing Expiration Date:   08/25/2019  . B12    Standing Status:   Future    Number of Occurrences:   1    Standing Expiration Date:   08/25/2019  . HgB A1c    Standing Status:   Future    Number of Occurrences:   1    Standing Expiration Date:   08/25/2019    Requested Prescriptions   Signed Prescriptions Disp Refills  . escitalopram (LEXAPRO) 20 MG tablet 90 tablet 3    Sig: Take 1 tablet (20 mg total) by mouth at bedtime.

## 2018-08-29 ENCOUNTER — Other Ambulatory Visit: Payer: Self-pay | Admitting: Family

## 2018-08-29 MED ORDER — SITAGLIPTIN PHOSPHATE 50 MG PO TABS: 50.0000 mg | ORAL_TABLET | Freq: Every day | ORAL | 1 refills | Status: DC

## 2018-09-22 ENCOUNTER — Other Ambulatory Visit: Payer: Self-pay | Admitting: Family

## 2018-09-22 ENCOUNTER — Encounter: Payer: Self-pay | Admitting: Family

## 2018-09-22 MED ORDER — LORAZEPAM 0.5 MG PO TABS
ORAL_TABLET | ORAL | 1 refills | Status: DC
Start: 2018-09-22 — End: 2018-12-22

## 2018-10-12 ENCOUNTER — Other Ambulatory Visit: Payer: Self-pay | Admitting: Family Medicine

## 2018-10-12 NOTE — Telephone Encounter (Signed)
Refill done.  

## 2018-10-16 ENCOUNTER — Other Ambulatory Visit: Payer: Self-pay | Admitting: Family

## 2018-10-18 ENCOUNTER — Other Ambulatory Visit: Payer: Self-pay | Admitting: Family

## 2018-10-18 MED ORDER — SIMVASTATIN 10 MG PO TABS: 10.0000 mg | ORAL_TABLET | Freq: Every day | ORAL | 1 refills | Status: DC

## 2018-11-17 ENCOUNTER — Encounter: Payer: Self-pay | Admitting: Family

## 2018-11-17 ENCOUNTER — Ambulatory Visit: Payer: Medicare Other | Admitting: Family

## 2018-11-20 ENCOUNTER — Other Ambulatory Visit: Payer: Self-pay | Admitting: Family

## 2018-11-20 MED ORDER — FLUTICASONE PROPIONATE 50 MCG/ACT NA SUSP: 2.0000 | Freq: Every day | NASAL | 11 refills | Status: DC

## 2018-11-22 ENCOUNTER — Encounter: Payer: Self-pay | Admitting: Family Medicine

## 2018-11-22 ENCOUNTER — Ambulatory Visit (INDEPENDENT_AMBULATORY_CARE_PROVIDER_SITE_OTHER): Payer: Medicare Other | Admitting: Family Medicine

## 2018-11-22 ENCOUNTER — Other Ambulatory Visit: Payer: Self-pay | Admitting: *Deleted

## 2018-11-22 ENCOUNTER — Other Ambulatory Visit: Payer: Self-pay

## 2018-11-22 DIAGNOSIS — S0083XA Contusion of other part of head, initial encounter: Secondary | ICD-10-CM

## 2018-11-22 DIAGNOSIS — S060X0A Concussion without loss of consciousness, initial encounter: Secondary | ICD-10-CM | POA: Diagnosis not present

## 2018-11-22 DIAGNOSIS — S060XAA Concussion with loss of consciousness status unknown, initial encounter: Secondary | ICD-10-CM | POA: Insufficient documentation

## 2018-11-22 DIAGNOSIS — S060X9A Concussion with loss of consciousness of unspecified duration, initial encounter: Secondary | ICD-10-CM | POA: Insufficient documentation

## 2018-11-22 MED ORDER — COQ10 200 MG PO CAPS: ORAL_CAPSULE | ORAL | 0 refills | Status: DC

## 2018-11-22 NOTE — Assessment & Plan Note (Signed)
Patient does have a concussion.  Significant bruising of the face.  History of Parkinson's that is likely is contributing.  We discussed walking with a cane from now on.  Patient is to be active.  We discussed which activities of doing which wants to avoid.  With patient's bruising across the bridge of the nose we did discuss the possibility of an x-ray of the face to rule out any type of fracture but we did not feel he would change medical management.  Patient was with primary caregiver which is her daughter.  At this moment we will try conservative therapy.  Patient knows what symptoms such as confusion, weakness of any of the extremities, visual changes to seek medical attention immediately.  Otherwise patient will follow-up again in 2 weeks.

## 2018-11-22 NOTE — Patient Instructions (Signed)
Good to see you  Mild concussion  Keep the arm cover when doing activity daily for a week.  Watch the teeth  Vitamin D 4000 IU daily  CoQ10 200mg  daily  Call me if things change  See me or virtual in 1-2 weeks

## 2018-11-22 NOTE — Assessment & Plan Note (Signed)
Patient will only take 81 mg of aspirin daily

## 2018-11-22 NOTE — Progress Notes (Signed)
[facility] [place], [place] [address] Phone: [phone] Subjective:   I Melanie Hughes am serving as a Education administrator for Dr. Hulan Saas.   CC: Recent fall head injury  PFX:[id number]  Melanie Hughes is a 83 y.o. female coming in with complaint of concussion trauma. [place] a week ago. [place] in her yard. Was not transported to ED. Has a lot of bruising and lacerations on her arms and her face. Hit her head. Nausea and slugish feeling. States that she has been feeling better. Fell on her face. Nose is sore. Hip is doing well. After her fall she was spitting up a lot of blood. States her top lip feels loose.  Patient did call but unfortunately did not want to go to urgent care secondary to the corona outbreak.  This is the first time she has been seen by a medical provider.  Onset- Thursday night  Location - Head pain Severity-initially 7 out of 10 but seems to be doing better.     Past Medical History:  Diagnosis Date  . Cancer ([facility])    breast  . Diabetes mellitus without complication ([facility])   . High blood cholesterol   . Hypertension    Past Surgical History:  Procedure Laterality Date  . ABDOMINAL HYSTERECTOMY    . JOINT REPLACEMENT    . MASTECTOMY    . ORIF ELBOW FRACTURE Left 07/01/2017   Procedure: OPEN REDUCTION INTERNAL FIXATION (ORIF) ELBOW/OLECRANON FRACTURE;  Surgeon: Shona Needles, MD;  Location: [facility];  Service: Orthopedics;  Laterality: Left;  . WRIST SURGERY     Social History   Socioeconomic History  . Marital status: Widowed    Spouse name: Not on file  . Number of children: 3  . Years of education: Not on file  . Highest education level: Not on file  Occupational History  . Not on file  Social Needs  . Financial resource strain: Not hard at all  . Food insecurity:    Worry: Never true    Inability: Never true  . Transportation needs:    Medical: No    Non-medical: No  Tobacco Use  . Smoking status: Never Smoker   . Smokeless tobacco: Never Used  Substance and Sexual Activity  . Alcohol use: No    Frequency: Never  . Drug use: No  . Sexual activity: Never  Lifestyle  . Physical activity:    Days per week: 3 days    Minutes per session: 40 min  . Stress: Only a little  Relationships  . Social connections:    Talks on phone: More than three times a week    Gets together: More than three times a week    Attends religious service: More than 4 times per year    Active member of club or organization: Not on file    Attends meetings of clubs or organizations: More than 4 times per year    Relationship status: Widowed  Other Topics Concern  . Not on file  Social History Narrative  . Not on file   Allergies  Allergen Reactions  . Morphine And Related Other (See Comments)    Confusion and agitation  . Penicillins Itching   Family History  Problem Relation Age of Onset  . Obesity Grandchild     Current Outpatient Medications (Endocrine & Metabolic):  .  glipiZIDE (GLUCOTROL) 5 MG tablet, TAKE 1 TABLET (5 MG) BY MOUTH 2 TIMES PER DAY 30 MINUTES  BEFORE MEALS .  metFORMIN (GLUCOPHAGE) 1000 MG tablet, Take 1 tablet (1,000 mg total) by mouth 2 (two) times daily. .  pioglitazone (ACTOS) 30 MG tablet, TAKE 1 TABLET BY MOUTH EVERY DAY .  sitaGLIPtin (JANUVIA) 50 MG tablet, Take 1 tablet (50 mg total) by mouth daily.  Current Outpatient Medications (Cardiovascular):  .  lisinopril-hydrochlorothiazide (PRINZIDE,ZESTORETIC) 20-12.5 MG tablet, Take 1 tablet by mouth every morning. .  simvastatin (ZOCOR) 10 MG tablet, Take 1 tablet (10 mg total) by mouth daily.  Current Outpatient Medications (Respiratory):  .  fluticasone (FLONASE) 50 MCG/ACT nasal spray, Place 2 sprays into both nostrils daily.  Current Outpatient Medications (Analgesics):  .  aspirin EC 81 MG tablet, Take 81 mg by mouth daily.   Current Outpatient Medications (Other):  .  bisacodyl (DULCOLAX) 5 MG EC tablet, Take 1 tablet (5  mg total) by mouth daily as needed for moderate constipation. .  blood glucose meter kit and supplies KIT, Use up to 4x daily as directed---diagnosis code E11.9 .  Calcium Carb-Cholecalciferol (CALCIUM 500+D PO), Calcium 500 + D .  carbidopa-levodopa (SINEMET IR) 25-100 MG tablet, Take 1 tablet by mouth 3 (three) times daily. .  CONTOUR NEXT TEST test strip,  .  escitalopram (LEXAPRO) 20 MG tablet, Take 1 tablet (20 mg total) by mouth at bedtime. Marland Kitchen  LITE TOUCH LANCETS MISC,  .  LORazepam (ATIVAN) 0.5 MG tablet, Take 2 tablets in the am; take 1 in the pm .  Multiple Vitamin (MULTIVITAMIN WITH MINERALS) TABS tablet, Take 1 tablet by mouth daily. .  Multiple Vitamins-Minerals (PRESERVISION AREDS 2+MULTI VIT) CAPS, Take 2 capsules by mouth daily. Marland Kitchen  UNABLE TO FIND, nitrofurantoin monohydrate/macrocrystals 100 mg capsule .  Vitamin D, Ergocalciferol, (DRISDOL) 1.25 MG (50000 UT) CAPS capsule, TAKE 1 CAPSULE (50,000 UNITS TOTAL) BY MOUTH EVERY 7 (SEVEN) DAYS.    Past medical history, social, surgical and family history all reviewed in electronic medical record.  No pertanent information unless stated regarding to the chief complaint.   Review of Systems:  No , visual changes, nausea, vomiting, diarrhea, constipation, dizziness, abdominal pain, skin rash, fevers, chills, night sweats, weight loss, swollen lymph nodes, body aches, joint swelling,  chest pain, shortness of breath, mood changes.  Mild positive headaches and muscle aches  Objective  Blood pressure 110/70, pulse 71, height _0  (1.676 m), weight 138 lb (62.6 kg), SpO2 97 %. Systems examined below as of    General: No apparent distress alert and oriented x3 mild masked feces.  Patient does have bruising more over around the right occipital area.  No true bony deformity noted.  Mild bruising under the left nasal bridge.  Patient does have some bruising over the maxillary midline. HEENT: Pupils equal, extraocular movements intact patient  when lifting the lid does show a potential injury to the frenulum.  Patient does have some mild bruising of the gums but does not appear to go to the teeth pulps except for the incisor on the left side may have some dried blood at the pulp area.  No true fracture of the tooth noted Respiratory: Patient's speak in full sentences and does not appear short of breath  Cardiovascular: No lower extremity edema, non tender, no erythema  Skin: Warm dry intact with no signs of infection or rash on extremities or on axial skeleton.  Abdomen: Soft nontender  Neuro: Cranial nerves II through XII are intact, neurovascularly intact in all extremities with 2+ DTRs and 2+ pulses.  Lymph:  No lymphadenopathy of posterior or anterior cervical chain or axillae bilaterally.  Gait antalgic with a shuffling gait MSK: Very mild arthritic changes and patient does have a mild left-sided resting tremor    Impression and Recommendations:     This case required medical decision making of moderate complexity. The above documentation has been reviewed and is accurate and complete Lyndal Pulley, DO       Note: This dictation was prepared with Dragon dictation along with smaller phrase technology. Any transcriptional errors that result from this process are unintentional.

## 2018-12-21 ENCOUNTER — Encounter: Payer: Self-pay | Admitting: Family

## 2018-12-22 ENCOUNTER — Other Ambulatory Visit: Payer: Self-pay | Admitting: Family

## 2018-12-22 MED ORDER — LORAZEPAM 0.5 MG PO TABS: ORAL_TABLET | ORAL | 1 refills | Status: DC

## 2018-12-22 MED ORDER — GLIPIZIDE 5 MG PO TABS: ORAL_TABLET | ORAL | 3 refills | Status: DC

## 2018-12-28 ENCOUNTER — Other Ambulatory Visit: Payer: Self-pay | Admitting: Family Medicine

## 2019-01-08 ENCOUNTER — Ambulatory Visit (INDEPENDENT_AMBULATORY_CARE_PROVIDER_SITE_OTHER): Payer: Medicare Other | Admitting: Family

## 2019-01-08 ENCOUNTER — Encounter: Payer: Self-pay | Admitting: Family

## 2019-01-08 DIAGNOSIS — N390 Urinary tract infection, site not specified: Secondary | ICD-10-CM

## 2019-01-08 DIAGNOSIS — R296 Repeated falls: Secondary | ICD-10-CM | POA: Diagnosis not present

## 2019-01-08 MED ORDER — NITROFURANTOIN MONOHYD MACRO 100 MG PO CAPS: 100.0000 mg | ORAL_CAPSULE | Freq: Two times a day (BID) | ORAL | 0 refills | Status: DC

## 2019-01-08 NOTE — Progress Notes (Signed)
Melanie Hughes is a 83 y.o. female with the following history as recorded in Epic[facility]:  Patient Active Problem List   Diagnosis Date Noted  . Concussion 11/22/2018  . Contusion of face 11/22/2018  . Chondroma 10/14/2017  . Normocytic anemia 06/29/2017  . Hx of breast cancer 06/29/2017  . Hypomagnesemia 06/29/2017  . Hyperlipidemia 06/29/2017  . Diabetes mellitus without complication ([facility]) 66/29/4765  . Hypertension 06/28/2017  . Closed left hip fracture, initial encounter ([facility]) 06/28/2017  . Closed fracture of left olecranon process 06/28/2017  . Anxiety 06/28/2017  . Closed displaced fracture of greater trochanter of left femur ([facility])     Current Outpatient Medications  Medication Sig Dispense Refill  . aspirin EC 81 MG tablet Take 81 mg by mouth daily.    . bisacodyl (DULCOLAX) 5 MG EC tablet Take 1 tablet (5 mg total) by mouth daily as needed for moderate constipation. 30 tablet 0  . blood glucose meter kit and supplies KIT Use up to 4x daily as directed---diagnosis code E11.9 1 each 0  . Calcium Carb-Cholecalciferol (CALCIUM 500+D PO) Calcium 500 + D    . carbidopa-levodopa (SINEMET IR) 25-100 MG tablet Take 1 tablet by mouth 3 (three) times daily.    . Coenzyme Q10 (COQ10) 200 MG CAPS Take 1 capsule daily. 90 capsule 0  . CONTOUR NEXT TEST test strip   1  . escitalopram (LEXAPRO) 20 MG tablet Take 1 tablet (20 mg total) by mouth at bedtime. 90 tablet 3  . fluticasone (FLONASE) 50 MCG/ACT nasal spray Place 2 sprays into both nostrils daily. 16 g 11  . glipiZIDE (GLUCOTROL) 5 MG tablet TAKE 1 TABLET (5 MG) BY MOUTH 2 TIMES PER DAY 30 MINUTES BEFORE MEALS 30 tablet 3  . lisinopril-hydrochlorothiazide (PRINZIDE,ZESTORETIC) 20-12.5 MG tablet Take 1 tablet by mouth every morning.  3  . LITE TOUCH LANCETS MISC   1  . LORazepam (ATIVAN) 0.5 MG tablet Take 2 tablets in the am; take 1 in the pm 90 tablet 1  . metFORMIN (GLUCOPHAGE) 1000 MG tablet Take 1 tablet (1,000 mg total) by mouth 2  (two) times daily.    . Multiple Vitamin (MULTIVITAMIN WITH MINERALS) TABS tablet Take 1 tablet by mouth daily.    . Multiple Vitamins-Minerals (PRESERVISION AREDS 2+MULTI VIT) CAPS Take 2 capsules by mouth daily.    . nitrofurantoin, macrocrystal-monohydrate, (MACROBID) 100 MG capsule Take 1 capsule (100 mg total) by mouth 2 (two) times daily. 14 capsule 0  . pioglitazone (ACTOS) 30 MG tablet TAKE 1 TABLET BY MOUTH EVERY DAY 90 tablet 1  . simvastatin (ZOCOR) 10 MG tablet Take 1 tablet (10 mg total) by mouth daily. 90 tablet 1  . sitaGLIPtin (JANUVIA) 50 MG tablet Take 1 tablet (50 mg total) by mouth daily. 90 tablet 1  . UNABLE TO FIND nitrofurantoin monohydrate/macrocrystals 100 mg capsule    . Vitamin D, Ergocalciferol, (DRISDOL) 1.25 MG (50000 UT) CAPS capsule TAKE 1 CAPSULE (50,000 UNITS TOTAL) BY MOUTH EVERY 7 (SEVEN) DAYS. 12 capsule 0   No current facility-administered medications for this visit.     Allergies: Morphine and related and Penicillins  Past Medical History:  Diagnosis Date  . Cancer ([facility])    breast  . Diabetes mellitus without complication ([facility])   . High blood cholesterol   . Hypertension     Past Surgical History:  Procedure Laterality Date  . ABDOMINAL HYSTERECTOMY    . JOINT REPLACEMENT    . MASTECTOMY    . ORIF  ELBOW FRACTURE Left 07/01/2017   Procedure: OPEN REDUCTION INTERNAL FIXATION (ORIF) ELBOW/OLECRANON FRACTURE;  Surgeon: Shona Needles, MD;  Location: [facility];  Service: Orthopedics;  Laterality: Left;  . WRIST SURGERY      Family History  Problem Relation Age of Onset  . Obesity Grandchild     Social History   Tobacco Use  . Smoking status: Never Smoker  . Smokeless tobacco: Never Used  Substance Use Topics  . Alcohol use: No    Frequency: Never    Subjective:   I connected with Melanie Hughes on 01/08/19 at  3:40 PM EDT by a telephone call and verified that I am speaking with the correct person using two identifiers.   I discussed the  limitations of evaluation and management by telemedicine and the availability of in person appointments. The patient expressed understanding and agreed to proceed.  Patient is concerned that she is developing a UTI; complaining of burning/ urgency with urination; has seen some blood in her urine today; no fever, no back pain; is prone to recurrent UTIs; has found some pyridium at home and wants to take to help with symptom relief.   Of note, she fell 2 weeks ago and fractured her right wrist; is in a sleeve/ under care of orthopedist in [place], [place]; per patient, she is doing much better.     Objective:  There were no vitals filed for this visit.  General: Well developed, well nourished, in no acute distress  Lungs: Respirations unlabored;  Neurologic: Alert and oriented; speech intact; face symmetrical;   Assessment:  1. Urinary tract infection without hematuria, site unspecified   2. Recurrent falls     Plan:  Rx for Macrobid 100 mg bid x 7 days; okay to use Pyridium for symptom relief; increase fluids,rest; Have also encouraged patient to let her neurologist know about her 2 recent falls;    Time spent 15 minutes No follow-ups on file.  No orders of the defined types were placed in this encounter.   Requested Prescriptions   Signed Prescriptions Disp Refills  . nitrofurantoin, macrocrystal-monohydrate, (MACROBID) 100 MG capsule 14 capsule 0    Sig: Take 1 capsule (100 mg total) by mouth 2 (two) times daily.

## 2019-01-29 LAB — HM MAMMOGRAPHY

## 2019-01-30 ENCOUNTER — Encounter: Payer: Self-pay | Admitting: Family

## 2019-01-30 NOTE — Progress Notes (Signed)
Outside notes received. Information abstracted. Notes sent to scan.  

## 2019-02-09 ENCOUNTER — Other Ambulatory Visit: Payer: Self-pay | Admitting: Family

## 2019-02-13 ENCOUNTER — Other Ambulatory Visit: Payer: Self-pay | Admitting: Family Medicine

## 2019-02-16 ENCOUNTER — Other Ambulatory Visit: Payer: Self-pay | Admitting: Family

## 2019-02-20 ENCOUNTER — Encounter: Payer: Medicare Other | Admitting: Family

## 2019-02-20 ENCOUNTER — Ambulatory Visit: Payer: Medicare Other | Admitting: Family Medicine

## 2019-02-20 ENCOUNTER — Ambulatory Visit: Payer: Medicare Other

## 2019-02-23 NOTE — Progress Notes (Addendum)
Subjective:   Melanie Hughes is a 83 y.o. female who presents for Medicare Annual (Subsequent) preventive examination.  Review of Systems:   Cardiac Risk Factors include: advanced age (>89mn, >>6women);diabetes mellitus;dyslipidemia;hypertension(not sendentary, stays busy within the home.) Sleep patterns: feels rested on waking, gets up 1 times nightly to void and sleeps 8 hours nightly.    Home Safety/Smoke Alarms: Feels safe in home. Smoke alarms in place.  Living environment; residence and Firearm Safety: 2-story house. Lives alone, no needs for DME, good support system Seat Belt Safety/Bike Helmet: Wears seat belt.      Objective:     Vitals: There were no vitals taken for this visit.  There is no height or weight on file to calculate BMI.  Advanced Directives 02/26/2019 02/16/2018 06/28/2017  Does Patient Have a Medical Advance Directive? No Yes Yes  Type of Advance Directive - HFoleyLiving will HAmite Does patient want to make changes to medical advance directive? - - No - Patient declined  Copy of HOrcuttin Chart? - No - copy requested No - copy requested  Would patient like information on creating a medical advance directive? Yes (ED - Information included in AVS) - -    Tobacco Social History   Tobacco Use  Smoking Status Never Smoker  Smokeless Tobacco Never Used     Counseling given: Not Answered  Past Medical History:  Diagnosis Date  . Cancer ([place]    breast  . Diabetes mellitus without complication ([facility]   . High blood cholesterol   . Hypertension    Past Surgical History:  Procedure Laterality Date  . ABDOMINAL HYSTERECTOMY    . JOINT REPLACEMENT    . MASTECTOMY    . ORIF ELBOW FRACTURE Left 07/01/2017   Procedure: OPEN REDUCTION INTERNAL FIXATION (ORIF) ELBOW/OLECRANON FRACTURE;  Surgeon: HShona Needles MD;  [facility]  Service: Orthopedics;  Laterality: Left;  . WRIST  SURGERY     Family History  Problem Relation Age of Onset  . Obesity Grandchild    Social History   Socioeconomic History  . Marital status: Widowed    Spouse name: Not on file  . Number of children: 3  . Years of education: Not on file  . Highest education level: Not on file  Occupational History  . Occupation: retired  SScientific laboratory technician . Financial resource strain: Not hard at all  . Food insecurity    Worry: Never true    Inability: Never true  . Transportation needs    Medical: No    Non-medical: No  Tobacco Use  . Smoking status: Never Smoker  . Smokeless tobacco: Never Used  Substance and Sexual Activity  . Alcohol use: No    Frequency: Never  . Drug use: No  . Sexual activity: Never  Lifestyle  . Physical activity    Days per week: 0 days    Minutes per session: 0 min  . Stress: Only a little  Relationships  . Social connections    Talks on phone: More than three times a week    Gets together: More than three times a week    Attends religious service: More than 4 times per year    Active member of club or organization: Yes    Attends meetings of clubs or organizations: More than 4 times per year    Relationship status: Widowed  Other Topics Concern  . Not on file  Social History Narrative  . Not on file    Outpatient Encounter Medications as of 02/26/2019  Medication Sig  . aspirin EC 81 MG tablet Take 81 mg by mouth daily.  . bisacodyl (DULCOLAX) 5 MG EC tablet Take 1 tablet (5 mg total) by mouth daily as needed for moderate constipation.  . blood glucose meter kit and supplies KIT Use up to 4x daily as directed---diagnosis code E11.9  . Calcium Carb-Cholecalciferol (CALCIUM 500+D PO) Calcium 500 + D  . carbidopa-levodopa (SINEMET IR) 25-100 MG tablet Take 1 tablet by mouth 3 (three) times daily.  . CONTOUR NEXT TEST test strip   . [facility] COQ-10 200 MG capsule TAKE 1 CAPSULE BY MOUTH EVERY DAY  . escitalopram (LEXAPRO) 20 MG tablet Take 1 tablet (20 mg  total) by mouth at bedtime.  . fluticasone (FLONASE) 50 MCG/ACT nasal spray Place 2 sprays into both nostrils daily.  Marland Kitchen JANUVIA 50 MG tablet TAKE 1 TABLET BY MOUTH EVERY DAY  . lisinopril-hydrochlorothiazide (ZESTORETIC) 20-12.5 MG tablet Take 1 tablet by mouth every morning.  Marland Kitchen LITE TOUCH LANCETS MISC   . metFORMIN (GLUCOPHAGE) 1000 MG tablet Take 1 tablet (1,000 mg total) by mouth 2 (two) times daily.  . Multiple Vitamin (MULTIVITAMIN WITH MINERALS) TABS tablet Take 1 tablet by mouth daily.  . Multiple Vitamins-Minerals (PRESERVISION AREDS 2+MULTI VIT) CAPS Take 2 capsules by mouth daily.  . pioglitazone (ACTOS) 30 MG tablet TAKE 1 TABLET BY MOUTH EVERY DAY  . simvastatin (ZOCOR) 10 MG tablet Take 1 tablet (10 mg total) by mouth daily.  Marland Kitchen UNABLE TO FIND nitrofurantoin monohydrate/macrocrystals 100 mg capsule  . Vitamin D, Ergocalciferol, (DRISDOL) 1.25 MG (50000 UT) CAPS capsule TAKE 1 CAPSULE (50,000 UNITS TOTAL) BY MOUTH EVERY 7 (SEVEN) DAYS.  . [DISCONTINUED] glipiZIDE (GLUCOTROL) 5 MG tablet TAKE 1 TABLET BY MOUTH TWICE A DAY 30 MINUTES BEFORE MEALS  . [DISCONTINUED] lisinopril-hydrochlorothiazide (PRINZIDE,ZESTORETIC) 20-12.5 MG tablet Take 1 tablet by mouth every morning.  . [DISCONTINUED] LORazepam (ATIVAN) 0.5 MG tablet Take 2 tablets in the am; take 1 in the pm  . [DISCONTINUED] metFORMIN (GLUCOPHAGE) 1000 MG tablet Take 1 tablet (1,000 mg total) by mouth 2 (two) times daily.  . [DISCONTINUED] nitrofurantoin, macrocrystal-monohydrate, (MACROBID) 100 MG capsule Take 1 capsule (100 mg total) by mouth 2 (two) times daily.   No facility-administered encounter medications on file as of 02/26/2019.     Activities of Daily Living In your present state of health, do you have any difficulty performing the following activities: 02/26/2019  Hearing? N  Vision? N  Difficulty concentrating or making decisions? N  Walking or climbing stairs? N  Dressing or bathing? N  Doing errands, shopping?  Y  Preparing Food and eating ? N  Using the Toilet? N  In the past six months, have you accidently leaked urine? N  Do you have problems with loss of bowel control? N  Managing your Medications? N  Managing your Finances? N  Housekeeping or managing your Housekeeping? N  Some recent data might be hidden    Patient Care Team: Marrian Salvage, [place] as PCP - General (Internal Medicine)    Assessment:   This is a routine wellness examination for Roxann. Physical assessment deferred to PCP.   Exercise Activities and Dietary recommendations Current Exercise Habits: The patient does not participate in regular exercise at present Diet (meal preparation, eat out, water intake, caffeinated beverages, dairy products, fruits and vegetables): in general, a "healthy" diet  , well balanced.  Reports having a good appetite.   Reviewed heart healthy and diabetic diet. Encouraged patient to increase daily water and healthy fluid intake. Supplements with Glucerna.  Goals    . Patient Stated    . Patient Stated     Continue to eat healthy to lower HGB A1c, stay active physically and socially, get plenty of sleep, increase the amount of water I drink daily.       Fall Risk Fall Risk  02/26/2019 02/16/2018 01/12/2018  Falls in the past year? 1 Yes Yes  Number falls in past yr: 1 2 or more 2 or more  Injury with Fall? 1 Yes Yes  Risk Factor Category  - High Fall Risk -  Risk for fall due to : Impaired mobility;Impaired balance/gait Impaired balance/gait -  Follow up Falls prevention discussed;Education provided Education provided;Falls prevention discussed Falls evaluation completed    Depression Screen PHQ 2/9 Scores 02/26/2019 02/16/2018  PHQ - 2 Score 1 1  PHQ- 9 Score - 3     Cognitive Function MMSE - Mini Mental State Exam 02/16/2018  Orientation to time 5  Orientation to Place 5  Registration 3  Attention/ Calculation 5  Recall 3  Language- name 2 objects 2  Language- repeat 1   Language- follow 3 step command 3  Language- read & follow direction 1  Write a sentence 1  Copy design 1  Total score 30     6CIT Screen 02/26/2019  What Year? 0 points  What month? 0 points  What time? 0 points  Count back from 20 0 points  Months in reverse 2 points  Repeat phrase 4 points  Total Score 6    Immunization History  Administered Date(s) Administered  . Influenza,inj,quad, With Preservative 04/08/2015  . Influenza-Unspecified 04/06/2016, 04/13/2018  . Tdap 07/05/2013  . Zoster 07/05/2013   Screening Tests Health Maintenance  Topic Date Due  . PNA vac Low Risk Adult (1 of 2 - PCV13) 09/13/2002  . HEMOGLOBIN A1C  02/23/2019  . INFLUENZA VACCINE  10/04/2019 (Originally 02/03/2019)  . OPHTHALMOLOGY EXAM  12/18/2019  . FOOT EXAM  02/26/2020  . TETANUS/TDAP  07/06/2023  . DEXA SCAN  Completed      Plan:     Reviewed health maintenance screenings with patient today and relevant education, vaccines, and/or referrals were provided.   I have personally reviewed and noted the following in the patient's chart:   . Medical and social history . Use of alcohol, tobacco or illicit drugs  . Current medications and supplements . Functional ability and status . Nutritional status . Physical activity . Advanced directives . List of other physicians . Vitals . Screenings to include cognitive, depression, and falls . Referrals and appointments  In addition, I have reviewed and discussed with patient certain preventive protocols, quality metrics, and best practice recommendations. A written personalized care plan for preventive services as well as general preventive health recommendations were provided to patient.     Michiel Cowboy, RN  02/26/2019   Medical screening examination/treatment/procedure(s) were performed by non-physician practitioner and as supervising provider I was immediately available for consultation/collaboration.  I agree with above. Marrian Salvage, FNP

## 2019-02-25 NOTE — Progress Notes (Deleted)
[facility] [address], [place] [address] Phone: [phone] Subjective:    I'm seeing this patient by the request  of:    CC:   [id number]  Carmella Kees is a 83 y.o. female coming in with complaint of ***  Onset-  Location Duration-  Character- Aggravating factors- Reliving factors-  Therapies tried-  Severity-     Past Medical History:  Diagnosis Date  . Cancer ([facility])    breast  . Diabetes mellitus without complication ([facility])   . High blood cholesterol   . Hypertension    Past Surgical History:  Procedure Laterality Date  . ABDOMINAL HYSTERECTOMY    . JOINT REPLACEMENT    . MASTECTOMY    . ORIF ELBOW FRACTURE Left 07/01/2017   Procedure: OPEN REDUCTION INTERNAL FIXATION (ORIF) ELBOW/OLECRANON FRACTURE;  Surgeon: Shona Needles, MD;  Location: [facility];  Service: Orthopedics;  Laterality: Left;  . WRIST SURGERY     Social History   Socioeconomic History  . Marital status: Widowed    Spouse name: Not on file  . Number of children: 3  . Years of education: Not on file  . Highest education level: Not on file  Occupational History  . Not on file  Social Needs  . Financial resource strain: Not hard at all  . Food insecurity    Worry: Never true    Inability: Never true  . Transportation needs    Medical: No    Non-medical: No  Tobacco Use  . Smoking status: Never Smoker  . Smokeless tobacco: Never Used  Substance and Sexual Activity  . Alcohol use: No    Frequency: Never  . Drug use: No  . Sexual activity: Never  Lifestyle  . Physical activity    Days per week: 3 days    Minutes per session: 40 min  . Stress: Only a little  Relationships  . Social connections    Talks on phone: More than three times a week    Gets together: More than three times a week    Attends religious service: More than 4 times per year    Active member of club or organization: Not on file    Attends meetings of clubs or  organizations: More than 4 times per year    Relationship status: Widowed  Other Topics Concern  . Not on file  Social History Narrative  . Not on file   Allergies  Allergen Reactions  . Morphine And Related Other (See Comments)    Confusion and agitation  . Penicillins Itching   Family History  Problem Relation Age of Onset  . Obesity Grandchild     Current Outpatient Medications (Endocrine & Metabolic):  .  glipiZIDE (GLUCOTROL) 5 MG tablet, TAKE 1 TABLET BY MOUTH TWICE A DAY 30 MINUTES BEFORE MEALS .  JANUVIA 50 MG tablet, TAKE 1 TABLET BY MOUTH EVERY DAY .  metFORMIN (GLUCOPHAGE) 1000 MG tablet, Take 1 tablet (1,000 mg total) by mouth 2 (two) times daily. .  pioglitazone (ACTOS) 30 MG tablet, TAKE 1 TABLET BY MOUTH EVERY DAY  Current Outpatient Medications (Cardiovascular):  .  lisinopril-hydrochlorothiazide (PRINZIDE,ZESTORETIC) 20-12.5 MG tablet, Take 1 tablet by mouth every morning. .  simvastatin (ZOCOR) 10 MG tablet, Take 1 tablet (10 mg total) by mouth daily.  Current Outpatient Medications (Respiratory):  .  fluticasone (FLONASE) 50 MCG/ACT nasal spray, Place 2 sprays into both nostrils daily.  Current Outpatient Medications (Analgesics):  .  aspirin EC 81 MG tablet, Take 81 mg by mouth daily.   Current Outpatient Medications (Other):  .  bisacodyl (DULCOLAX) 5 MG EC tablet, Take 1 tablet (5 mg total) by mouth daily as needed for moderate constipation. .  blood glucose meter kit and supplies KIT, Use up to 4x daily as directed---diagnosis code E11.9 .  Calcium Carb-Cholecalciferol (CALCIUM 500+D PO), Calcium 500 + D .  carbidopa-levodopa (SINEMET IR) 25-100 MG tablet, Take 1 tablet by mouth 3 (three) times daily. .  CONTOUR NEXT TEST test strip,  .  [employer] COQ-10 200 MG capsule, TAKE 1 CAPSULE BY MOUTH EVERY DAY .  escitalopram (LEXAPRO) 20 MG tablet, Take 1 tablet (20 mg total) by mouth at bedtime. Marland Kitchen  LITE TOUCH LANCETS MISC,  .  LORazepam (ATIVAN) 0.5 MG tablet,  Take 2 tablets in the am; take 1 in the pm .  Multiple Vitamin (MULTIVITAMIN WITH MINERALS) TABS tablet, Take 1 tablet by mouth daily. .  Multiple Vitamins-Minerals (PRESERVISION AREDS 2+MULTI VIT) CAPS, Take 2 capsules by mouth daily. .  nitrofurantoin, macrocrystal-monohydrate, (MACROBID) 100 MG capsule, Take 1 capsule (100 mg total) by mouth 2 (two) times daily. Marland Kitchen  UNABLE TO FIND, nitrofurantoin monohydrate/macrocrystals 100 mg capsule .  Vitamin D, Ergocalciferol, (DRISDOL) 1.25 MG (50000 UT) CAPS capsule, TAKE 1 CAPSULE (50,000 UNITS TOTAL) BY MOUTH EVERY 7 (SEVEN) DAYS.    Past medical history, social, surgical and family history all reviewed in electronic medical record.  No pertanent information unless stated regarding to the chief complaint.   Review of Systems:  No headache, visual changes, nausea, vomiting, diarrhea, constipation, dizziness, abdominal pain, skin rash, fevers, chills, night sweats, weight loss, swollen lymph nodes, body aches, joint swelling, muscle aches, chest pain, shortness of breath, mood changes.   Objective  There were no vitals taken for this visit. Systems examined below as of    General: No apparent distress alert and oriented x3 mood and affect normal, dressed appropriately.  HEENT: Pupils equal, extraocular movements intact  Respiratory: Patient's speak in full sentences and does not appear short of breath  Cardiovascular: No lower extremity edema, non tender, no erythema  Skin: Warm dry intact with no signs of infection or rash on extremities or on axial skeleton.  Abdomen: Soft nontender  Neuro: Cranial nerves II through XII are intact, neurovascularly intact in all extremities with 2+ DTRs and 2+ pulses.  Lymph: No lymphadenopathy of posterior or anterior cervical chain or axillae bilaterally.  Gait normal with good balance and coordination.  MSK:  Non tender with full range of motion and good stability and symmetric strength and tone of shoulders,  elbows, wrist, hip, knee and ankles bilaterally.     Impression and Recommendations:     This case required medical decision making of moderate complexity. The above documentation has been reviewed and is accurate and complete Lyndal Pulley, DO       Note: This dictation was prepared with Dragon dictation along with smaller phrase technology. Any transcriptional errors that result from this process are unintentional.

## 2019-02-26 ENCOUNTER — Ambulatory Visit: Payer: Medicare Other

## 2019-02-26 ENCOUNTER — Other Ambulatory Visit (INDEPENDENT_AMBULATORY_CARE_PROVIDER_SITE_OTHER): Payer: Medicare Other

## 2019-02-26 ENCOUNTER — Ambulatory Visit (INDEPENDENT_AMBULATORY_CARE_PROVIDER_SITE_OTHER): Payer: Medicare Other | Admitting: Family

## 2019-02-26 ENCOUNTER — Other Ambulatory Visit: Payer: Self-pay

## 2019-02-26 ENCOUNTER — Encounter: Payer: Self-pay | Admitting: Family

## 2019-02-26 ENCOUNTER — Ambulatory Visit: Payer: Medicare Other | Admitting: Family Medicine

## 2019-02-26 ENCOUNTER — Ambulatory Visit (INDEPENDENT_AMBULATORY_CARE_PROVIDER_SITE_OTHER): Payer: Medicare Other | Admitting: *Deleted

## 2019-02-26 VITALS — BP 110/78 | HR 80 | Temp 98.3°F | Ht 66.0 in | Wt 139.0 lb

## 2019-02-26 VITALS — BP 110/78 | HR 80 | Ht 66.0 in | Wt 139.0 lb

## 2019-02-26 DIAGNOSIS — E559 Vitamin D deficiency, unspecified: Secondary | ICD-10-CM | POA: Diagnosis not present

## 2019-02-26 DIAGNOSIS — Z0000 Encounter for general adult medical examination without abnormal findings: Secondary | ICD-10-CM

## 2019-02-26 DIAGNOSIS — G20 Parkinson's disease: Secondary | ICD-10-CM

## 2019-02-26 DIAGNOSIS — Z87440 Personal history of urinary (tract) infections: Secondary | ICD-10-CM

## 2019-02-26 DIAGNOSIS — M810 Age-related osteoporosis without current pathological fracture: Secondary | ICD-10-CM

## 2019-02-26 DIAGNOSIS — E119 Type 2 diabetes mellitus without complications: Secondary | ICD-10-CM

## 2019-02-26 DIAGNOSIS — F419 Anxiety disorder, unspecified: Secondary | ICD-10-CM

## 2019-02-26 DIAGNOSIS — G20A1 Parkinson's disease without dyskinesia, without mention of fluctuations: Secondary | ICD-10-CM | POA: Insufficient documentation

## 2019-02-26 DIAGNOSIS — C4442 Squamous cell carcinoma of skin of scalp and neck: Secondary | ICD-10-CM

## 2019-02-26 DIAGNOSIS — I10 Essential (primary) hypertension: Secondary | ICD-10-CM | POA: Diagnosis not present

## 2019-02-26 LAB — COMPREHENSIVE METABOLIC PANEL
ALT: 8 U/L (ref 0–35)
AST: 15 U/L (ref 0–37)
Albumin: 4.3 g/dL (ref 3.5–5.2)
Alkaline Phosphatase: 48 U/L (ref 39–117)
BUN: 30 mg/dL — ABNORMAL HIGH (ref 6–23)
CO2: 29 mEq/L (ref 19–32)
Calcium: 9.5 mg/dL (ref 8.4–10.5)
Chloride: 101 mEq/L (ref 96–112)
Creatinine, Ser: 0.99 mg/dL (ref 0.40–1.20)
GFR: 53.77 mL/min — ABNORMAL LOW (ref >60.00)
Glucose, Bld: 155 mg/dL — ABNORMAL HIGH (ref 70–99)
Potassium: 4.8 mEq/L (ref 3.5–5.1)
Sodium: 137 mEq/L (ref 135–145)
Total Bilirubin: 0.3 mg/dL (ref 0.2–1.2)
Total Protein: 6.9 g/dL (ref 6.0–8.3)

## 2019-02-26 LAB — URINALYSIS
Bilirubin Urine: NEGATIVE
Hgb urine dipstick: NEGATIVE
Ketones, ur: NEGATIVE
Leukocytes,Ua: NEGATIVE
Nitrite: NEGATIVE
Specific Gravity, Urine: 1.02 (ref 1.000–1.030)
Total Protein, Urine: NEGATIVE
Urine Glucose: NEGATIVE
Urobilinogen, UA: 0.2 (ref 0.0–1.0)
pH: 6 (ref 5.0–8.0)

## 2019-02-26 LAB — CBC WITH DIFFERENTIAL/PLATELET
Basophils Absolute: 0 10*3/uL (ref 0.0–0.1)
Basophils Relative: 0.7 % (ref 0.0–3.0)
Eosinophils Absolute: 0.2 10*3/uL (ref 0.0–0.7)
Eosinophils Relative: 3.5 % (ref 0.0–5.0)
HCT: 34.9 % — ABNORMAL LOW (ref 36.0–46.0)
Hemoglobin: 11.3 g/dL — ABNORMAL LOW (ref 12.0–15.0)
Lymphocytes Relative: 11.6 % — ABNORMAL LOW (ref 12.0–46.0)
Lymphs Abs: 0.7 10*3/uL (ref 0.7–4.0)
MCHC: 32.3 g/dL (ref 30.0–36.0)
MCV: 89 fl (ref 78.0–100.0)
Monocytes Absolute: 0.5 10*3/uL (ref 0.1–1.0)
Monocytes Relative: 8 % (ref 3.0–12.0)
Neutro Abs: 4.9 10*3/uL (ref 1.4–7.7)
Neutrophils Relative %: 76.2 % (ref 43.0–77.0)
Platelets: 178 10*3/uL (ref 150.0–400.0)
RBC: 3.93 Mil/uL (ref 3.87–5.11)
RDW: 14.4 % (ref 11.5–15.5)
WBC: 6.4 10*3/uL (ref 4.0–10.5)

## 2019-02-26 LAB — LIPID PANEL
Cholesterol: 133 mg/dL (ref 0–200)
HDL: 66.2 mg/dL (ref >39.00)
LDL Cholesterol: 50 mg/dL (ref 0–99)
NonHDL: 66.49
Total CHOL/HDL Ratio: 2
Triglycerides: 83 mg/dL (ref 0.0–149.0)
VLDL: 16.6 mg/dL (ref 0.0–40.0)

## 2019-02-26 LAB — HEMOGLOBIN A1C: Hgb A1c MFr Bld: 7.5 % — ABNORMAL HIGH (ref 4.6–6.5)

## 2019-02-26 MED ORDER — LORAZEPAM 0.5 MG PO TABS: ORAL_TABLET | ORAL | 1 refills | Status: DC

## 2019-02-26 MED ORDER — GLIPIZIDE 5 MG PO TABS: ORAL_TABLET | ORAL | 3 refills | Status: DC

## 2019-02-26 MED ORDER — DENOSUMAB 60 MG/ML ~~LOC~~ SOSY
60.0000 mg | PREFILLED_SYRINGE | Freq: Once | SUBCUTANEOUS | Status: AC
  Administered 2019-02-26: 13:00:00 60 mg via SUBCUTANEOUS

## 2019-02-26 MED ORDER — LISINOPRIL-HYDROCHLOROTHIAZIDE 20-12.5 MG PO TABS: 1.0000 | ORAL_TABLET | Freq: Every morning | ORAL | 3 refills | Status: DC

## 2019-02-26 MED ORDER — METFORMIN HCL 1000 MG PO TABS: 1000.0000 mg | ORAL_TABLET | Freq: Two times a day (BID) | ORAL | 3 refills | Status: DC

## 2019-02-26 NOTE — Progress Notes (Signed)
Melanie Hughes is a 83 y.o. female with the following history as recorded in Epic[facility]:  Patient Active Problem List   Diagnosis Date Noted  . Parkinson's disease ([facility]) 02/26/2019  . Concussion 11/22/2018  . Contusion of face 11/22/2018  . Chondroma 10/14/2017  . Normocytic anemia 06/29/2017  . Hx of breast cancer 06/29/2017  . Hypomagnesemia 06/29/2017  . Hyperlipidemia 06/29/2017  . Diabetes mellitus without complication ([facility]) 62/95/2841  . Hypertension 06/28/2017  . Closed left hip fracture, initial encounter ([facility]) 06/28/2017  . Closed fracture of left olecranon process 06/28/2017  . Anxiety 06/28/2017  . Closed displaced fracture of greater trochanter of left femur ([facility])     Current Outpatient Medications  Medication Sig Dispense Refill  . aspirin EC 81 MG tablet Take 81 mg by mouth daily.    . bisacodyl (DULCOLAX) 5 MG EC tablet Take 1 tablet (5 mg total) by mouth daily as needed for moderate constipation. 30 tablet 0  . blood glucose meter kit and supplies KIT Use up to 4x daily as directed---diagnosis code E11.9 1 each 0  . Calcium Carb-Cholecalciferol (CALCIUM 500+D PO) Calcium 500 + D    . carbidopa-levodopa (SINEMET IR) 25-100 MG tablet Take 1 tablet by mouth 3 (three) times daily.    . CONTOUR NEXT TEST test strip   1  . CVS COQ-10 200 MG capsule TAKE 1 CAPSULE BY MOUTH EVERY DAY 90 capsule 0  . escitalopram (LEXAPRO) 20 MG tablet Take 1 tablet (20 mg total) by mouth at bedtime. 90 tablet 3  . fluticasone (FLONASE) 50 MCG/ACT nasal spray Place 2 sprays into both nostrils daily. 16 g 11  . glipiZIDE (GLUCOTROL) 5 MG tablet TAKE 1 TABLET BY MOUTH TWICE A DAY 30 MINUTES BEFORE MEALS 90 tablet 3  . JANUVIA 50 MG tablet TAKE 1 TABLET BY MOUTH EVERY DAY 90 tablet 1  . lisinopril-hydrochlorothiazide (ZESTORETIC) 20-12.5 MG tablet Take 1 tablet by mouth every morning. 90 tablet 3  . LITE TOUCH LANCETS MISC   1  . LORazepam (ATIVAN) 0.5 MG tablet Take 2 tablets in the am; take 1 in  the pm 90 tablet 1  . metFORMIN (GLUCOPHAGE) 1000 MG tablet Take 1 tablet (1,000 mg total) by mouth 2 (two) times daily. 180 tablet 3  . Multiple Vitamin (MULTIVITAMIN WITH MINERALS) TABS tablet Take 1 tablet by mouth daily.    . Multiple Vitamins-Minerals (PRESERVISION AREDS 2+MULTI VIT) CAPS Take 2 capsules by mouth daily.    . pioglitazone (ACTOS) 30 MG tablet TAKE 1 TABLET BY MOUTH EVERY DAY 90 tablet 1  . simvastatin (ZOCOR) 10 MG tablet Take 1 tablet (10 mg total) by mouth daily. 90 tablet 1  . UNABLE TO FIND nitrofurantoin monohydrate/macrocrystals 100 mg capsule    . Vitamin D, Ergocalciferol, (DRISDOL) 1.25 MG (50000 UT) CAPS capsule TAKE 1 CAPSULE (50,000 UNITS TOTAL) BY MOUTH EVERY 7 (SEVEN) DAYS. 12 capsule 0   No current facility-administered medications for this visit.     Allergies: Morphine and related and Penicillins  Past Medical History:  Diagnosis Date  . Cancer ([facility])    breast  . Diabetes mellitus without complication ([facility])   . High blood cholesterol   . Hypertension     Past Surgical History:  Procedure Laterality Date  . ABDOMINAL HYSTERECTOMY    . JOINT REPLACEMENT    . MASTECTOMY    . ORIF ELBOW FRACTURE Left 07/01/2017   Procedure: OPEN REDUCTION INTERNAL FIXATION (ORIF) ELBOW/OLECRANON FRACTURE;  Surgeon: Shona Needles,  MD;  Location: [facility];  Service: Orthopedics;  Laterality: Left;  . WRIST SURGERY      Family History  Problem Relation Age of Onset  . Obesity Grandchild     Social History   Tobacco Use  . Smoking status: Never Smoker  . Smokeless tobacco: Never Used  Substance Use Topics  . Alcohol use: No    Frequency: Never    Subjective:  Presents for follow-up on chronic care needs including  1) Type 2 Diabetes; 2) Hypertension; 3) Osteoporosis- due for Prolia injection today; 4) Anxiety- stable on Lexapro; 5) Parkinson's Disease- good response to treatment with her neurologist; has noticed marked improvement in her tremors; 6)  Currently under care of dermatology/ Mohs provider for squamous cell- will need to have staples removed form scalp next week;      Objective:  Vitals:   02/26/19 1101  BP: 110/78  Pulse: 80  Temp: 98.3 F (36.8 C)  TempSrc: Oral  SpO2: 97%  Weight: 139 lb (63 kg)  Height: _0  (1.676 m)    General: Well developed, well nourished, in no acute distress  Skin : Warm and dry. Staples noted across back of scalp Head: Normocephalic and atraumatic  Eyes: Sclera and conjunctiva clear; pupils round and reactive to light; extraocular movements intact  Ears: External normal; canals clear; tympanic membranes normal  Oropharynx: Pink, supple. No suspicious lesions  Neck: Supple without thyromegaly, adenopathy  Lungs: Respirations unlabored; clear to auscultation bilaterally without wheeze, rales, rhonchi  CVS exam: normal rate and regular rhythm.  Abdomen: Soft; nontender; nondistended; normoactive bowel sounds; no masses or hepatosplenomegaly  Musculoskeletal: No deformities; no active joint inflammation  Extremities: No edema, cyanosis, clubbing  Vessels: Symmetric bilaterally  Neurologic: Alert and oriented; speech intact; face symmetrical; moves all extremities well; CNII-XII intact without focal deficit   Assessment:  1. Diabetes mellitus without complication ([facility])   2. History of UTI   3. Vitamin D deficiency   4. Essential hypertension   5. Parkinson's disease ([facility])   6. Squamous cell carcinoma of scalp   7. Anxiety     Plan:  Age appropriate preventive healthcare needs addressed; encouraged regular eye doctor and dental exams; encouraged regular exercise; will update labs and refills as needed today; follow-up to be determined; Follow-up in 6 months, sooner prn.   No follow-ups on file.  Orders Placed This Encounter  Procedures  . CBC w/Diff    Standing Status:   Future    Number of Occurrences:   1    Standing Expiration Date:   02/26/2020  . Comp Met (CMET)     Standing Status:   Future    Number of Occurrences:   1    Standing Expiration Date:   02/26/2020  . Lipid panel    Standing Status:   Future    Number of Occurrences:   1    Standing Expiration Date:   02/26/2020  . HgB A1c    Standing Status:   Future    Number of Occurrences:   1    Standing Expiration Date:   02/26/2020  . Urinalysis    Standing Status:   Future    Number of Occurrences:   1    Standing Expiration Date:   02/26/2020  . Vitamin D (25 hydroxy)    Standing Status:   Future    Number of Occurrences:   1    Standing Expiration Date:   02/26/2020    Requested Prescriptions  Signed Prescriptions Disp Refills  . metFORMIN (GLUCOPHAGE) 1000 MG tablet 180 tablet 3    Sig: Take 1 tablet (1,000 mg total) by mouth 2 (two) times daily.  Marland Kitchen lisinopril-hydrochlorothiazide (ZESTORETIC) 20-12.5 MG tablet 90 tablet 3    Sig: Take 1 tablet by mouth every morning.  Marland Kitchen LORazepam (ATIVAN) 0.5 MG tablet 90 tablet 1    Sig: Take 2 tablets in the am; take 1 in the pm  . glipiZIDE (GLUCOTROL) 5 MG tablet 90 tablet 3    Sig: TAKE 1 TABLET BY MOUTH TWICE A DAY 30 MINUTES BEFORE MEALS

## 2019-02-26 NOTE — Patient Instructions (Signed)
Continue doing brain stimulating activities (puzzles, reading, adult coloring books, staying active) to keep memory sharp.   Continue to eat heart healthy diet (full of fruits, vegetables, whole grains, lean protein, water--limit salt, fat, and sugar intake) and increase physical activity as tolerated.   Melanie Hughes , Thank you for taking time to come for your Medicare Wellness Visit. I appreciate your ongoing commitment to your health goals. Please review the following plan we discussed and let me know if I can assist you in the future.   These are the goals we discussed: Goals    . Patient Stated    . Patient Stated     Continue to eat healthy to lower HGB A1c, stay active physically and socially, get plenty of sleep, increase the amount of water I drink daily.       This is a list of the screening recommended for you and due dates:  Health Maintenance  Topic Date Due  . Pneumonia vaccines (1 of 2 - PCV13) 09/13/2002  . Complete foot exam   01/31/2019  . Flu Shot  02/03/2019  . Hemoglobin A1C  02/23/2019  . Eye exam for diabetics  12/18/2019  . Tetanus Vaccine  07/06/2023  . DEXA scan (bone density measurement)  Completed    Preventive Care 65 Years and Older, Female Preventive care refers to lifestyle choices and visits with your health care provider that can promote health and wellness. This includes:  A yearly physical exam. This is also called an annual well check.  Regular dental and eye exams.  Immunizations.  Screening for certain conditions.  Healthy lifestyle choices, such as diet and exercise. What can I expect for my preventive care visit? Physical exam Your health care provider will check:  Height and weight. These may be used to calculate body mass index (BMI), which is a measurement that tells if you are at a healthy weight.  Heart rate and blood pressure.  Your skin for abnormal spots. Counseling Your health care provider may ask you questions about:   Alcohol, tobacco, and drug use.  Emotional well-being.  Home and relationship well-being.  Sexual activity.  Eating habits.  History of falls.  Memory and ability to understand (cognition).  Work and work Statistician.  Pregnancy and menstrual history. What immunizations do I need?  Influenza (flu) vaccine  This is recommended every year. Tetanus, diphtheria, and pertussis (Tdap) vaccine  You may need a Td booster every 10 years. Varicella (chickenpox) vaccine  You may need this vaccine if you have not already been vaccinated. Zoster (shingles) vaccine  You may need this after age 33. Pneumococcal conjugate (PCV13) vaccine  One dose is recommended after age 35. Pneumococcal polysaccharide (PPSV23) vaccine  One dose is recommended after age 60. Measles, mumps, and rubella (MMR) vaccine  You may need at least one dose of MMR if you were born in 1957 or later. You may also need a second dose. Meningococcal conjugate (MenACWY) vaccine  You may need this if you have certain conditions. Hepatitis A vaccine  You may need this if you have certain conditions or if you travel or work in places where you may be exposed to hepatitis A. Hepatitis B vaccine  You may need this if you have certain conditions or if you travel or work in places where you may be exposed to hepatitis B. Haemophilus influenzae type b (Hib) vaccine  You may need this if you have certain conditions. You may receive vaccines as individual doses  or as more than one vaccine together in one shot (combination vaccines). Talk with your health care provider about the risks and benefits of combination vaccines. What tests do I need? Blood tests  Lipid and cholesterol levels. These may be checked every 5 years, or more frequently depending on your overall health.  Hepatitis C test.  Hepatitis B test. Screening  Lung cancer screening. You may have this screening every year starting at age 12 if you  have a 30-pack-year history of smoking and currently smoke or have quit within the past 15 years.  Colorectal cancer screening. All adults should have this screening starting at age 37 and continuing until age 36. Your health care provider may recommend screening at age 34 if you are at increased risk. You will have tests every 1-10 years, depending on your results and the type of screening test.  Diabetes screening. This is done by checking your blood sugar (glucose) after you have not eaten for a while (fasting). You may have this done every 1-3 years.  Mammogram. This may be done every 1-2 years. Talk with your health care provider about how often you should have regular mammograms.  BRCA-related cancer screening. This may be done if you have a family history of breast, ovarian, tubal, or peritoneal cancers. Other tests  Sexually transmitted disease (STD) testing.  Bone density scan. This is done to screen for osteoporosis. You may have this done starting at age 87. Follow these instructions at home: Eating and drinking  Eat a diet that includes fresh fruits and vegetables, whole grains, lean protein, and low-fat dairy products. Limit your intake of foods with high amounts of sugar, saturated fats, and salt.  Take vitamin and mineral supplements as recommended by your health care provider.  Do not drink alcohol if your health care provider tells you not to drink.  If you drink alcohol: ? Limit how much you have to 0-1 drink a day. ? Be aware of how much alcohol is in your drink. In the [place] one drink equals one 12 oz bottle of beer (355 mL), one 5 oz glass of Emmilia Sowder (148 mL), or one 1 oz glass of hard liquor (44 mL). Lifestyle  Take daily care of your teeth and gums.  Stay active. Exercise for at least 30 minutes on 5 or more days each week.  Do not use any products that contain nicotine or tobacco, such as cigarettes, e-cigarettes, and chewing tobacco. If you need help quitting,  ask your health care provider.  If you are sexually active, practice safe sex. Use a condom or other form of protection in order to prevent STIs (sexually transmitted infections).  Talk with your health care provider about taking a low-dose aspirin or statin. What's next?  Go to your health care provider once a year for a well check visit.  Ask your health care provider how often you should have your eyes and teeth checked.  Stay up to date on all vaccines. This information is not intended to replace advice given to you by your health care provider. Make sure you discuss any questions you have with your health care provider. Document Released: 07/18/2015 Document Revised: 06/15/2018 Document Reviewed: 06/15/2018 Elsevier Patient Education  2020 [employer].

## 2019-02-26 NOTE — Addendum Note (Signed)
Addended by: Ander Slade on: 02/26/2019 12:32 PM   Modules accepted: Orders

## 2019-02-27 LAB — VITAMIN D 25 HYDROXY (VIT D DEFICIENCY, FRACTURES): VITD: 62.99 ng/mL (ref 30.00–100.00)

## 2019-03-22 ENCOUNTER — Other Ambulatory Visit: Payer: Self-pay | Admitting: Family Medicine

## 2019-04-09 ENCOUNTER — Other Ambulatory Visit: Payer: Self-pay | Admitting: Family

## 2019-04-25 ENCOUNTER — Other Ambulatory Visit: Payer: Self-pay | Admitting: Family Medicine

## 2019-05-03 ENCOUNTER — Other Ambulatory Visit: Payer: Self-pay | Admitting: Family

## 2019-05-07 ENCOUNTER — Other Ambulatory Visit: Payer: Self-pay | Admitting: Family

## 2019-06-18 ENCOUNTER — Other Ambulatory Visit: Payer: Self-pay | Admitting: Family

## 2019-07-22 ENCOUNTER — Other Ambulatory Visit: Payer: Self-pay | Admitting: Family Medicine

## 2019-08-13 ENCOUNTER — Ambulatory Visit: Payer: Medicare Other | Attending: Internal Medicine

## 2019-08-13 DIAGNOSIS — Z23 Encounter for immunization: Secondary | ICD-10-CM | POA: Insufficient documentation

## 2019-08-13 NOTE — Progress Notes (Signed)
   Covid-19 Vaccination Clinic  Name:  Melanie Hughes    MRN: [id number] DOB: 04-Apr-1936  08/13/2019  Melanie Hughes was observed post Covid-19 immunization for 15 minutes without incidence. She was provided with Vaccine Information Sheet and instruction to access the V-Safe system.   Melanie Hughes was instructed to call 911 with any severe reactions post vaccine: Marland Kitchen Difficulty breathing  . Swelling of your face and throat  . A fast heartbeat  . A bad rash all over your body  . Dizziness and weakness    Immunizations Administered    Name Date Dose VIS Date Route   Pfizer COVID-19 Vaccine 08/13/2019  5:38 PM 0.3 mL 06/15/2019 Intramuscular   Manufacturer: [place]   Lot: [id number]   [place]: [id number]

## 2019-08-14 ENCOUNTER — Other Ambulatory Visit: Payer: Self-pay | Admitting: Family

## 2019-09-03 ENCOUNTER — Ambulatory Visit: Payer: Self-pay | Admitting: Family

## 2019-09-03 ENCOUNTER — Ambulatory Visit: Payer: Medicare Other | Admitting: Family Medicine

## 2019-09-04 ENCOUNTER — Ambulatory Visit: Payer: Medicare Other | Admitting: Family

## 2019-09-04 ENCOUNTER — Ambulatory Visit: Payer: Medicare Other | Admitting: Family Medicine

## 2019-09-04 ENCOUNTER — Encounter: Payer: Self-pay | Admitting: Family

## 2019-09-04 ENCOUNTER — Encounter: Payer: Self-pay | Admitting: Family Medicine

## 2019-09-04 ENCOUNTER — Other Ambulatory Visit: Payer: Self-pay

## 2019-09-04 VITALS — BP 122/62 | HR 95 | Temp 98.0°F | Ht 66.0 in | Wt 131.0 lb

## 2019-09-04 DIAGNOSIS — R200 Anesthesia of skin: Secondary | ICD-10-CM

## 2019-09-04 DIAGNOSIS — H6123 Impacted cerumen, bilateral: Secondary | ICD-10-CM | POA: Diagnosis not present

## 2019-09-04 DIAGNOSIS — G20 Parkinson's disease: Secondary | ICD-10-CM

## 2019-09-04 DIAGNOSIS — R634 Abnormal weight loss: Secondary | ICD-10-CM

## 2019-09-04 DIAGNOSIS — R202 Paresthesia of skin: Secondary | ICD-10-CM | POA: Diagnosis not present

## 2019-09-04 DIAGNOSIS — E119 Type 2 diabetes mellitus without complications: Secondary | ICD-10-CM

## 2019-09-04 DIAGNOSIS — F419 Anxiety disorder, unspecified: Secondary | ICD-10-CM

## 2019-09-04 LAB — CBC WITH DIFFERENTIAL/PLATELET
Basophils Absolute: 0 10*3/uL (ref 0.0–0.1)
Basophils Relative: 0.3 % (ref 0.0–3.0)
Eosinophils Absolute: 0 10*3/uL (ref 0.0–0.7)
Eosinophils Relative: 0.6 % (ref 0.0–5.0)
HCT: 35.3 % — ABNORMAL LOW (ref 36.0–46.0)
Hemoglobin: 11.7 g/dL — ABNORMAL LOW (ref 12.0–15.0)
Lymphocytes Relative: 8.2 % — ABNORMAL LOW (ref 12.0–46.0)
Lymphs Abs: 0.6 10*3/uL — ABNORMAL LOW (ref 0.7–4.0)
MCHC: 33.1 g/dL (ref 30.0–36.0)
MCV: 90.1 fl (ref 78.0–100.0)
Monocytes Absolute: 0.5 10*3/uL (ref 0.1–1.0)
Monocytes Relative: 6.4 % (ref 3.0–12.0)
Neutro Abs: 6 10*3/uL (ref 1.4–7.7)
Neutrophils Relative %: 84.5 % — ABNORMAL HIGH (ref 43.0–77.0)
Platelets: 170 10*3/uL (ref 150.0–400.0)
RBC: 3.92 Mil/uL (ref 3.87–5.11)
RDW: 13.9 % (ref 11.5–15.5)
WBC: 7.1 10*3/uL (ref 4.0–10.5)

## 2019-09-04 LAB — COMPREHENSIVE METABOLIC PANEL
ALT: 9 U/L (ref 0–35)
AST: 14 U/L (ref 0–37)
Albumin: 4 g/dL (ref 3.5–5.2)
Alkaline Phosphatase: 40 U/L (ref 39–117)
BUN: 36 mg/dL — ABNORMAL HIGH (ref 6–23)
CO2: 28 mEq/L (ref 19–32)
Calcium: 9.5 mg/dL (ref 8.4–10.5)
Chloride: 98 mEq/L (ref 96–112)
Creatinine, Ser: 1.09 mg/dL (ref 0.40–1.20)
GFR: 48.06 mL/min — ABNORMAL LOW (ref >60.00)
Glucose, Bld: 248 mg/dL — ABNORMAL HIGH (ref 70–99)
Potassium: 4.8 mEq/L (ref 3.5–5.1)
Sodium: 134 mEq/L — ABNORMAL LOW (ref 135–145)
Total Bilirubin: 0.4 mg/dL (ref 0.2–1.2)
Total Protein: 6.8 g/dL (ref 6.0–8.3)

## 2019-09-04 LAB — TSH: TSH: 0.7 u[IU]/mL (ref 0.35–4.50)

## 2019-09-04 LAB — VITAMIN B12: Vitamin B-12: 995 pg/mL — ABNORMAL HIGH (ref 211–911)

## 2019-09-04 LAB — HEMOGLOBIN A1C: Hgb A1c MFr Bld: 7.1 % — ABNORMAL HIGH (ref 4.6–6.5)

## 2019-09-04 MED ORDER — PIOGLITAZONE HCL 30 MG PO TABS: 30.0000 mg | ORAL_TABLET | Freq: Every day | ORAL | 3 refills | Status: DC

## 2019-09-04 MED ORDER — LORAZEPAM 0.5 MG PO TABS: ORAL_TABLET | ORAL | 1 refills | Status: DC

## 2019-09-04 NOTE — Assessment & Plan Note (Signed)
Patient continues to have some difficulty.  Chronic problem with mild exacerbation.  Seeing a neurologist on a regular basis as well.  Encourage patient to do home exercises, we also considered though I think patient will do better in formal physical therapy after patient does have immunization.  Social determinants of health is difficulty with transportation and due to the comorbidities patient is a fall risk.  Guidance given to discussed different things in the environment that could be beneficial.  Follow-up again in 8 to 12 weeks if needed.

## 2019-09-04 NOTE — Progress Notes (Signed)
Melanie Hughes is a 84 y.o. female with the following history as recorded in EpicCare:  Patient Active Problem List   Diagnosis Date Noted  . Parkinson's disease ([place]) 02/26/2019  . Concussion 11/22/2018  . Contusion of face 11/22/2018  . Chondroma 10/14/2017  . Normocytic anemia 06/29/2017  . Hx of breast cancer 06/29/2017  . Hypomagnesemia 06/29/2017  . Hyperlipidemia 06/29/2017  . Diabetes mellitus without complication ([place]) 16/04/9603  . Hypertension 06/28/2017  . Closed left hip fracture, initial encounter ([place]) 06/28/2017  . Closed fracture of left olecranon process 06/28/2017  . Anxiety 06/28/2017  . Closed displaced fracture of greater trochanter of left femur (H[facility])     Current Outpatient Medications  Medication Sig Dispense Refill  . aspirin EC 81 MG tablet Take 81 mg by mouth daily.    . bisacodyl (DULCOLAX) 5 MG EC tablet Take 1 tablet (5 mg total) by mouth daily as needed for moderate constipation. 30 tablet 0  . blood glucose meter kit and supplies KIT Use up to 4x daily as directed---diagnosis code E11.9 1 each 0  . Calcium Carb-Cholecalciferol (CALCIUM 500+D PO) Calcium 500 + D    . carbidopa-levodopa (SINEMET IR) 25-100 MG tablet Take 1 tablet by mouth 3 (three) times daily.    . CONTOUR NEXT TEST test strip   1  . [facility] COQ-10 200 MG capsule TAKE 1 CAPSULE BY MOUTH EVERY DAY 90 capsule 0  . escitalopram (LEXAPRO) 20 MG tablet Take 1 tablet (20 mg total) by mouth at bedtime. 90 tablet 3  . fluticasone (FLONASE) 50 MCG/ACT nasal spray Place 2 sprays into both nostrils daily. 16 g 11  . glipiZIDE (GLUCOTROL) 5 MG tablet TAKE 1 TABLET BY MOUTH TWICE A DAY 30 MINUTES BEFORE MEALS 90 tablet 1  . JANUVIA 50 MG tablet TAKE 1 TABLET BY MOUTH EVERY DAY 90 tablet 1  . lisinopril-hydrochlorothiazide (ZESTORETIC) 20-12.5 MG tablet Take 1 tablet by mouth every morning. 90 tablet 3  . LITE TOUCH LANCETS MISC   1  . LORazepam (ATIVAN) 0.5 MG tablet TAKE 2 TABLETS IN THE MORNING TAKE  1 IN THE EVENING 90 tablet 1  . metFORMIN (GLUCOPHAGE) 1000 MG tablet Take 1 tablet (1,000 mg total) by mouth 2 (two) times daily. 180 tablet 3  . Multiple Vitamin (MULTIVITAMIN WITH MINERALS) TABS tablet Take 1 tablet by mouth daily.    . Multiple Vitamins-Minerals (PRESERVISION AREDS 2+MULTI VIT) CAPS Take 2 capsules by mouth daily.    . pioglitazone (ACTOS) 30 MG tablet Take 1 tablet (30 mg total) by mouth daily. 90 tablet 3  . simvastatin (ZOCOR) 10 MG tablet TAKE 1 TABLET BY MOUTH EVERY DAY 90 tablet 1  . UNABLE TO FIND nitrofurantoin monohydrate/macrocrystals 100 mg capsule    . Vitamin D, Ergocalciferol, (DRISDOL) 1.25 MG (50000 UNIT) CAPS capsule TAKE 1 CAPSULE (50,000 UNITS TOTAL) BY MOUTH EVERY 7 (SEVEN) DAYS. 12 capsule 0   No current facility-administered medications for this visit.    Allergies: Morphine and related and Penicillins  Past Medical History:  Diagnosis Date  . Cancer ([place])    breast  . Diabetes mellitus without complication ([place])   . High blood cholesterol   . Hypertension     Past Surgical History:  Procedure Laterality Date  . ABDOMINAL HYSTERECTOMY    . JOINT REPLACEMENT    . MASTECTOMY    . ORIF ELBOW FRACTURE Left 07/01/2017   Procedure: OPEN REDUCTION INTERNAL FIXATION (ORIF) ELBOW/OLECRANON FRACTURE;  Surgeon: Shona Needles, MD;  Location: [facility];  Service: Orthopedics;  Laterality: Left;  . WRIST SURGERY      Family History  Problem Relation Age of Onset  . Obesity Grandchild     Social History   Tobacco Use  . Smoking status: Never Smoker  . Smokeless tobacco: Never Used  Substance Use Topics  . Alcohol use: No    Subjective:  Presents for 6 month follow-up on chronic care needs; is accompanied by her daughter today; no acute concerns today- wants to discuss use of Ativan; her neurologist tried to change her to Atarax at night but is having no relief; patient is not concerned about habit forming potential of Ativan and has done well on  this regimen for many years; Has lost 8 pounds since last office visit- has been eating smaller meals/ denies any concerns for nausea, appetite change; Also feels that ears need to be cleaned out today;   Objective:  Vitals:   09/04/19 1056  BP: 122/62  Pulse: 95  Temp: 98 F (36.7 C)  TempSrc: Oral  SpO2: 96%  Weight: 131 lb (59.4 kg)  Height: '5\' 6"'$  (1.676 m)    General: Well developed, well nourished, in no acute distress  Skin : Warm and dry.  Head: Normocephalic and atraumatic  Eyes: Sclera and conjunctiva clear; pupils round and reactive to light; extraocular movements intact  Ears: External normal; upon initial exam, biltateral cerumen impaction; after lavage, canals clear; tympanic membranes normal  Oropharynx: Pink, supple. No suspicious lesions  Neck: Supple without thyromegaly, adenopathy  Lungs: Respirations unlabored; clear to auscultation bilaterally without wheeze, rales, rhonchi  CVS exam: normal rate and regular rhythm.  Neurologic: Alert and oriented; speech intact; face symmetrical; moves all extremities well; CNII-XII intact without focal deficit   Assessment:  1. Diabetes mellitus without complication ([place])   2. Weight loss   3. Numbness and tingling   4. Bilateral impacted cerumen   5. Anxiety     Plan:  1. Update labs today; continue current medications; 2. Check TSH today;  3. Check B12 today; 4. Ear lavage completed with no difficulty; 5. Stable; risks/ benefits of Ativan discussed- feel that benefits of medication outweigh the habit forming potential;  This visit occurred during the SARS-CoV-2 public health emergency.  Safety protocols were in place, including screening questions prior to the visit, additional usage of staff PPE, and extensive cleaning of exam room while observing appropriate contact time as indicated for disinfecting solutions.     No follow-ups on file.  Orders Placed This Encounter  Procedures  . CBC w/Diff  . Comp Met  (CMET)  . HgB A1c  . CBC w/Diff  . Comp Met (CMET)  . HgB A1c  . TSH  . B12    Requested Prescriptions   Signed Prescriptions Disp Refills  . pioglitazone (ACTOS) 30 MG tablet 90 tablet 3    Sig: Take 1 tablet (30 mg total) by mouth daily.  Marland Kitchen LORazepam (ATIVAN) 0.5 MG tablet 90 tablet 1    Sig: TAKE 2 TABLETS IN THE MORNING TAKE 1 IN THE EVENING

## 2019-09-04 NOTE — Progress Notes (Signed)
Patient consent obtained. Irrigation with water and peroxide performed. Full view of both tympanic membranes after procedure.  Patient tolerated procedure well.   

## 2019-09-04 NOTE — Progress Notes (Signed)
[facility] [place] Phone: [phone] Subjective:   Fontaine No, am serving as a scribe for Dr. Hulan Saas. This visit occurred during the SARS-CoV-2 public health emergency.  Safety protocols were in place, including screening questions prior to the visit, additional usage of staff PPE, and extensive cleaning of exam room while observing appropriate contact time as indicated for disinfecting solutions.  I'm seeing this patient by the request  of:  Marrian Salvage, FNP  CC:   [id number]   11/22/2018 Patient does have a concussion.  Significant bruising of the face.  History of Parkinson's that is likely is contributing.  We discussed walking with a cane from now on.  Patient is to be active.  We discussed which activities of doing which wants to avoid.  With patient's bruising across the bridge of the nose we did discuss the possibility of an x-ray of the face to rule out any type of fracture but we did not feel he would change medical management.  Patient was with primary caregiver which is her daughter.  At this moment we will try conservative therapy.  Patient knows what symptoms such as confusion, weakness of any of the extremities, visual changes to seek medical attention immediately.  Otherwise patient will follow-up again in 2 weeks.  Update 09/04/2019 Shontay Wallner is a 84 y.o. female coming in with complaint of polyarthralgia. Is here to discuss continuing prolia and vitamin d. Has second COVID vaccine on Saturday so is not taking the Prolia injection today.  Patient is following up with her primary care provider.  Patient's neurologist for Parkinson's try to transition patient from Ativan to hydroxyzine.  Patient feels like he is not doing well and is also giving her more than stomach issues.    Past Medical History:  Diagnosis Date  . Cancer ([place])    breast  . Diabetes mellitus without complication ([place])     . High blood cholesterol   . Hypertension    Past Surgical History:  Procedure Laterality Date  . ABDOMINAL HYSTERECTOMY    . JOINT REPLACEMENT    . MASTECTOMY    . ORIF ELBOW FRACTURE Left 07/01/2017   Procedure: OPEN REDUCTION INTERNAL FIXATION (ORIF) ELBOW/OLECRANON FRACTURE;  Surgeon: Shona Needles, MD;  Location: [facility];  Service: Orthopedics;  Laterality: Left;  . WRIST SURGERY     Social History   Socioeconomic History  . Marital status: Widowed    Spouse name: Not on file  . Number of children: 3  . Years of education: Not on file  . Highest education level: Not on file  Occupational History  . Occupation: retired  Tobacco Use  . Smoking status: Never Smoker  . Smokeless tobacco: Never Used  Substance and Sexual Activity  . Alcohol use: No  . Drug use: No  . Sexual activity: Never  Other Topics Concern  . Not on file  Social History Narrative  . Not on file   Social Determinants of Health   Financial Resource Strain:   . Difficulty of Paying Living Expenses: Not on file  Food Insecurity:   . Worried About Charity fundraiser in the Last Year: Not on file  . Ran Out of Food in the Last Year: Not on file  Transportation Needs:   . Lack of Transportation (Medical): Not on file  . Lack of Transportation (Non-Medical): Not on file  Physical Activity: Inactive  . Days of Exercise  per Week: 0 days  . Minutes of Exercise per Session: 0 min  Stress:   . Feeling of Stress : Not on file  Social Connections: Unknown  . Frequency of Communication with Friends and Family: Not on file  . Frequency of Social Gatherings with Friends and Family: Not on file  . Attends Religious Services: Not on file  . Active Member of Clubs or Organizations: Yes  . Attends Archivist Meetings: Not on file  . Marital Status: Not on file   Allergies  Allergen Reactions  . Morphine And Related Other (See Comments)    Confusion and agitation  . Penicillins Itching    Family History  Problem Relation Age of Onset  . Obesity Grandchild     Current Outpatient Medications (Endocrine & Metabolic):  .  glipiZIDE (GLUCOTROL) 5 MG tablet, TAKE 1 TABLET BY MOUTH TWICE A DAY 30 MINUTES BEFORE MEALS .  JANUVIA 50 MG tablet, TAKE 1 TABLET BY MOUTH EVERY DAY .  metFORMIN (GLUCOPHAGE) 1000 MG tablet, Take 1 tablet (1,000 mg total) by mouth 2 (two) times daily. .  pioglitazone (ACTOS) 30 MG tablet, TAKE 1 TABLET BY MOUTH EVERY DAY  Current Outpatient Medications (Cardiovascular):  .  lisinopril-hydrochlorothiazide (ZESTORETIC) 20-12.5 MG tablet, Take 1 tablet by mouth every morning. .  simvastatin (ZOCOR) 10 MG tablet, TAKE 1 TABLET BY MOUTH EVERY DAY  Current Outpatient Medications (Respiratory):  .  fluticasone (FLONASE) 50 MCG/ACT nasal spray, Place 2 sprays into both nostrils daily.  Current Outpatient Medications (Analgesics):  .  aspirin EC 81 MG tablet, Take 81 mg by mouth daily.   Current Outpatient Medications (Other):  .  bisacodyl (DULCOLAX) 5 MG EC tablet, Take 1 tablet (5 mg total) by mouth daily as needed for moderate constipation. .  blood glucose meter kit and supplies KIT, Use up to 4x daily as directed---diagnosis code E11.9 .  Calcium Carb-Cholecalciferol (CALCIUM 500+D PO), Calcium 500 + D .  carbidopa-levodopa (SINEMET IR) 25-100 MG tablet, Take 1 tablet by mouth 3 (three) times daily. .  CONTOUR NEXT TEST test strip,  .  [facility]S COQ-10 200 MG capsule, TAKE 1 CAPSULE BY MOUTH EVERY DAY .  escitalopram (LEXAPRO) 20 MG tablet, Take 1 tablet (20 mg total) by mouth at bedtime. Marland Kitchen  LITE TOUCH LANCETS MISC,  .  LORazepam (ATIVAN) 0.5 MG tablet, TAKE 2 TABLETS IN THE MORNING TAKE 1 IN THE EVENING .  Multiple Vitamin (MULTIVITAMIN WITH MINERALS) TABS tablet, Take 1 tablet by mouth daily. .  Multiple Vitamins-Minerals (PRESERVISION AREDS 2+MULTI VIT) CAPS, Take 2 capsules by mouth daily. Marland Kitchen  UNABLE TO FIND, nitrofurantoin monohydrate/macrocrystals  100 mg capsule .  Vitamin D, Ergocalciferol, (DRISDOL) 1.25 MG (50000 UNIT) CAPS capsule, TAKE 1 CAPSULE (50,000 UNITS TOTAL) BY MOUTH EVERY 7 (SEVEN) DAYS.   Reviewed prior external information including notes and imaging from  primary care provider As well as notes that were available from care everywhere and other healthcare systems.  Past medical history, social, surgical and family history all reviewed in electronic medical record.  No pertanent information unless stated regarding to the chief complaint.   Review of Systems:  No headache, visual changes, nausea, vomiting, diarrhea, constipation, dizziness, abdominal pain, skin rash, fevers, chills, night sweats, weight loss, swollen lymph nodes, body aches, joint swelling, chest pain, shortness of breath, mood changes. POSITIVE muscle aches  Objective  Blood pressure 122/62, height _0  (1.676 m), weight 131 lb (59.4 kg).   General: No apparent distress  alert and oriented x3 mood and affect normal, dressed appropriately.  HEENT: Pupils equal, extraocular movements intact  Respiratory: Patient's speak in full sentences and does not appear short of breath  Cardiovascular: No lower extremity edema, non tender, no erythema  Skin: Warm dry intact with no signs of infection or rash on extremities or on axial skeleton.  Abdomen: Soft nontender  Neuro: Cranial nerves II through XII are intact, neurovascularly intact in all extremities with 2+ DTRs and 2+ pulses.  Lymph: No lymphadenopathy of posterior or anterior cervical chain or axillae bilaterally.  Gait normal with good balance and coordination.  MSK: Mild resting tremor of the right upper extremity noted.  Patient has some very mild cogwheeling noted all the extremities.  Secondary to the coronavirus precautions.  He is sitting comfortably in the chair.  Instability with getting out of the chair but fairly normal gait     Impression and Recommendations:     This case required  medical decision making of moderate complexity. The above documentation has been reviewed and is accurate and complete Lyndal Pulley, DO       Note: This dictation was prepared with Dragon dictation along with smaller phrase technology. Any transcriptional errors that result from this process are unintentional.

## 2019-09-04 NOTE — Patient Instructions (Signed)
Good to see you Stop hydroxyzine Talk to Mickel Baas on the other meds Write [place] when you decide on home health or PT I am here if you need me

## 2019-09-08 ENCOUNTER — Ambulatory Visit: Payer: Medicare Other | Attending: Internal Medicine

## 2019-09-08 DIAGNOSIS — Z23 Encounter for immunization: Secondary | ICD-10-CM

## 2019-09-08 NOTE — Progress Notes (Signed)
   Covid-19 Vaccination Clinic  Name:  Melanie Hughes    MRN: [id number] DOB: Nov 06, 1935  09/08/2019  [name] was observed post Covid-19 immunization for 15 minutes without incident. She was provided with Vaccine Information Sheet and instruction to access the V-Safe system.   [name] was instructed to call 911 with any severe reactions post vaccine: Marland Kitchen Difficulty breathing  . Swelling of face and throat  . A fast heartbeat  . A bad rash all over body  . Dizziness and weakness   Immunizations Administered    Name Date Dose VIS Date Route   Pfizer COVID-19 Vaccine 09/08/2019 10:06 AM 0.3 mL 06/15/2019 Intramuscular   Manufacturer: [place]   Lot: [id number]   [place]: [id number]

## 2019-10-07 ENCOUNTER — Other Ambulatory Visit: Payer: Self-pay | Admitting: Family

## 2019-10-12 ENCOUNTER — Other Ambulatory Visit: Payer: Self-pay | Admitting: Family

## 2019-10-12 MED ORDER — GLIPIZIDE 5 MG PO TABS: 5.0000 mg | ORAL_TABLET | Freq: Every day | ORAL | 1 refills | Status: DC

## 2019-11-26 ENCOUNTER — Other Ambulatory Visit: Payer: Self-pay | Admitting: Family

## 2019-11-26 ENCOUNTER — Encounter: Payer: Self-pay | Admitting: Family

## 2019-11-26 MED ORDER — METFORMIN HCL 1000 MG PO TABS: 1000.0000 mg | ORAL_TABLET | Freq: Two times a day (BID) | ORAL | 1 refills | Status: DC

## 2019-11-26 MED ORDER — LORAZEPAM 0.5 MG PO TABS: ORAL_TABLET | ORAL | 1 refills | Status: DC

## 2019-11-26 MED ORDER — ESCITALOPRAM OXALATE 20 MG PO TABS: 20.0000 mg | ORAL_TABLET | Freq: Every day | ORAL | 3 refills | Status: DC

## 2020-01-11 ENCOUNTER — Other Ambulatory Visit: Payer: Self-pay | Admitting: Family

## 2020-02-08 ENCOUNTER — Other Ambulatory Visit: Payer: Self-pay | Admitting: Family

## 2020-02-18 ENCOUNTER — Other Ambulatory Visit: Payer: Self-pay

## 2020-02-18 ENCOUNTER — Encounter: Payer: Self-pay | Admitting: Family Medicine

## 2020-02-18 MED ORDER — VITAMIN D (ERGOCALCIFEROL) 1.25 MG (50000 UNIT) PO CAPS: 50000.0000 [IU] | ORAL_CAPSULE | ORAL | 0 refills | Status: AC

## 2020-02-29 ENCOUNTER — Ambulatory Visit: Payer: Medicare Other | Admitting: Family

## 2020-02-29 ENCOUNTER — Ambulatory Visit (INDEPENDENT_AMBULATORY_CARE_PROVIDER_SITE_OTHER): Payer: Medicare Other

## 2020-02-29 DIAGNOSIS — Z0000 Encounter for general adult medical examination without abnormal findings: Secondary | ICD-10-CM

## 2020-02-29 NOTE — Patient Instructions (Addendum)
Melanie Hughes , Thank you for taking time to come for your Medicare Wellness Visit. I appreciate your ongoing commitment to your health goals. Please review the following plan we discussed and let me know if I can assist you in the future.   Screening recommendations/referrals: Colonoscopy: no repeat due to age 84: 02/2020 Bone Density: 01/16/2018 Recommended yearly ophthalmology/optometry visit for glaucoma screening and checkup Recommended yearly dental visit for hygiene and checkup  Vaccinations: Influenza vaccine: 2020 Pneumococcal vaccine: completed Tdap vaccine: 07/2013; due every 10 years Shingles vaccine: never done   Covid-19: completed  Advanced directives: Please bring a copy of your health care power of attorney and living will to the office at your convenience.  Conditions/risks identified: Yes. Reviewed health maintenance screenings with patient today and relevant education, vaccines, and/or referrals were provided. Continue doing brain stimulating activities (puzzles, reading, adult coloring books, staying active) to keep memory sharp. Continue to eat heart healthy diet (full of fruits, vegetables, whole grains, lean protein, water--limit salt, fat, and sugar intake) and increase physical activity as tolerated.  Next appointment: Please schedule your next Medicare Wellness Visit with your Nurse Health Advisor in 1 year.   Preventive Care 47 Years and Older, Female Preventive care refers to lifestyle choices and visits with your health care provider that can promote health and wellness. What does preventive care include?  A yearly physical exam. This is also called an annual Hughes check.  Dental exams once or twice a year.  Routine eye exams. Ask your health care provider how often you should have your eyes checked.  Personal lifestyle choices, including:  Daily care of your teeth and gums.  Regular physical activity.  Eating a healthy diet.  Avoiding tobacco  and drug use.  Limiting alcohol use.  Practicing safe sex.  Taking low-dose aspirin every day.  Taking vitamin and mineral supplements as recommended by your health care provider. What happens during an annual Hughes check? The services and screenings done by your health care provider during your annual Hughes check will depend on your age, overall health, lifestyle risk factors, and family history of disease. Counseling  Your health care provider may ask you questions about your:  Alcohol use.  Tobacco use.  Drug use.  Emotional Hughes-being.  Home and relationship Hughes-being.  Sexual activity.  Eating habits.  History of falls.  Memory and ability to understand (cognition).  Work and work Statistician.  Reproductive health. Screening  You may have the following tests or measurements:  Height, weight, and BMI.  Blood pressure.  Lipid and cholesterol levels. These may be checked every 5 years, or more frequently if you are over 29 years old.  Skin check.  Lung cancer screening. You may have this screening every year starting at age 57 if you have a 30-pack-year history of smoking and currently smoke or have quit within the past 15 years.  Fecal occult blood test (FOBT) of the stool. You may have this test every year starting at age 48.  Flexible sigmoidoscopy or colonoscopy. You may have a sigmoidoscopy every 5 years or a colonoscopy every 10 years starting at age 35.  Hepatitis C blood test.  Hepatitis B blood test.  Sexually transmitted disease (STD) testing.  Diabetes screening. This is done by checking your blood sugar (glucose) after you have not eaten for a while (fasting). You may have this done every 1-3 years.  Bone density scan. This is done to screen for osteoporosis. You may have this done starting  at age 56.  Mammogram. This may be done every 1-2 years. Talk to your health care provider about how often you should have regular mammograms. Talk with  your health care provider about your test results, treatment options, and if necessary, the need for more tests. Vaccines  Your health care provider may recommend certain vaccines, such as:  Influenza vaccine. This is recommended every year.  Tetanus, diphtheria, and acellular pertussis (Tdap, Td) vaccine. You may need a Td booster every 10 years.  Zoster vaccine. You may need this after age 88.  Pneumococcal 13-valent conjugate (PCV13) vaccine. One dose is recommended after age 62.  Pneumococcal polysaccharide (PPSV23) vaccine. One dose is recommended after age 30. Talk to your health care provider about which screenings and vaccines you need and how often you need them. This information is not intended to replace advice given to you by your health care provider. Make sure you discuss any questions you have with your health care provider. Document Released: 07/18/2015 Document Revised: 03/10/2016 Document Reviewed: 04/22/2015 Elsevier Interactive Patient Education  2017 [place] Prevention in the Home Falls can cause injuries. They can happen to people of all ages. There are many things you can do to make your home safe and to help prevent falls. What can I do on the outside of my home?  Regularly fix the edges of walkways and driveways and fix any cracks.  Remove anything that might make you trip as you walk through a door, such as a raised step or threshold.  Trim any bushes or trees on the path to your home.  Use bright outdoor lighting.  Clear any walking paths of anything that might make someone trip, such as rocks or tools.  Regularly check to see if handrails are loose or broken. Make sure that both sides of any steps have handrails.  Any raised decks and porches should have guardrails on the edges.  Have any leaves, snow, or ice cleared regularly.  Use sand or salt on walking paths during winter.  Clean up any spills in your garage right away. This  includes oil or grease spills. What can I do in the bathroom?  Use night lights.  Install grab bars by the toilet and in the tub and shower. Do not use towel bars as grab bars.  Use non-skid mats or decals in the tub or shower.  If you need to sit down in the shower, use a plastic, non-slip stool.  Keep the floor dry. Clean up any water that spills on the floor as soon as it happens.  Remove soap buildup in the tub or shower regularly.  Attach bath mats securely with double-sided non-slip rug tape.  Do not have throw rugs and other things on the floor that can make you trip. What can I do in the bedroom?  Use night lights.  Make sure that you have a light by your bed that is easy to reach.  Do not use any sheets or blankets that are too big for your bed. They should not hang down onto the floor.  Have a firm chair that has side arms. You can use this for support while you get dressed.  Do not have throw rugs and other things on the floor that can make you trip. What can I do in the kitchen?  Clean up any spills right away.  Avoid walking on wet floors.  Keep items that you use a lot in easy-to-reach places.  If  you need to reach something above you, use a strong step stool that has a grab bar.  Keep electrical cords out of the way.  Do not use floor polish or wax that makes floors slippery. If you must use wax, use non-skid floor wax.  Do not have throw rugs and other things on the floor that can make you trip. What can I do with my stairs?  Do not leave any items on the stairs.  Make sure that there are handrails on both sides of the stairs and use them. Fix handrails that are broken or loose. Make sure that handrails are as long as the stairways.  Check any carpeting to make sure that it is firmly attached to the stairs. Fix any carpet that is loose or worn.  Avoid having throw rugs at the top or bottom of the stairs. If you do have throw rugs, attach them to the  floor with carpet tape.  Make sure that you have a light switch at the top of the stairs and the bottom of the stairs. If you do not have them, ask someone to add them for you. What else can I do to help prevent falls?  Wear shoes that:  Do not have high heels.  Have rubber bottoms.  Are comfortable and fit you Hughes.  Are closed at the toe. Do not wear sandals.  If you use a stepladder:  Make sure that it is fully opened. Do not climb a closed stepladder.  Make sure that both sides of the stepladder are locked into place.  Ask someone to hold it for you, if possible.  Clearly mark and make sure that you can see:  Any grab bars or handrails.  First and last steps.  Where the edge of each step is.  Use tools that help you move around (mobility aids) if they are needed. These include:  Canes.  Walkers.  Scooters.  Crutches.  Turn on the lights when you go into a dark area. Replace any light bulbs as soon as they burn out.  Set up your furniture so you have a clear path. Avoid moving your furniture around.  If any of your floors are uneven, fix them.  If there are any pets around you, be aware of where they are.  Review your medicines with your doctor. Some medicines can make you feel dizzy. This can increase your chance of falling. Ask your doctor what other things that you can do to help prevent falls. This information is not intended to replace advice given to you by your health care provider. Make sure you discuss any questions you have with your health care provider. Document Released: 04/17/2009 Document Revised: 11/27/2015 Document Reviewed: 07/26/2014 Elsevier Interactive Patient Education  2017 [employer].

## 2020-02-29 NOTE — Progress Notes (Addendum)
I connected with Melanie Hughes today by telephone and verified that I am speaking with the correct person using two identifiers. Location patient: home Location provider: work Persons participating in the virtual visit: Teia Freitas and Lisette Abu, LPN   I discussed the limitations, risks, security and privacy concerns of performing an evaluation and management service by telephone and the availability of in person appointments. I also discussed with the patient that there may be a patient responsible charge related to this service. The patient expressed understanding and verbally consented to this telephonic visit.    Interactive audio and video telecommunications were attempted between this provider and patient, however failed, due to patient having technical difficulties OR patient did not have access to video capability.  We continued and completed visit with audio only.  Some vital signs may be absent or patient reported.   Time Spent with patient on telephone encounter: 25 minutes  Subjective:   Melanie Hughes is a 84 y.o. female who presents for Medicare Annual (Subsequent) preventive examination.  Review of Systems    No ROS. Medicare Wellness Visit. Cardiac Risk Factors include: advanced age (>56men, >57 women);diabetes mellitus;dyslipidemia;hypertension     Objective:    There were no vitals filed for this visit. There is no height or weight on file to calculate BMI.  Advanced Directives 02/29/2020 02/26/2019 02/16/2018 06/28/2017  Does Patient Have a Medical Advance Directive? Yes No Yes Yes  Type of Advance Directive Living will - [place];Living will [place]  Does patient want to make changes to medical advance directive? No - Patient declined - - No - Patient declined  Copy of [place] in Chart? - - No - copy requested No - copy requested  Would patient like information on creating a medical advance  directive? - Yes (ED - Information included in AVS) - -    Current Medications (verified) Outpatient Encounter Medications as of 02/29/2020  Medication Sig  . aspirin EC 81 MG tablet Take 81 mg by mouth daily.  . bisacodyl (DULCOLAX) 5 MG EC tablet Take 1 tablet (5 mg total) by mouth daily as needed for moderate constipation.  . blood glucose meter kit and supplies KIT Use up to 4x daily as directed---diagnosis code E11.9  . Calcium Carb-Cholecalciferol (CALCIUM 500+D PO) Calcium 500 + D  . carbidopa-levodopa (SINEMET IR) 25-100 MG tablet Take 1 tablet by mouth 3 (three) times daily.  . CONTOUR NEXT TEST test strip   . [facility] COQ-10 200 MG capsule TAKE 1 CAPSULE BY MOUTH EVERY DAY  . escitalopram (LEXAPRO) 20 MG tablet Take 1 tablet (20 mg total) by mouth at bedtime.  . fluticasone (FLONASE) 50 MCG/ACT nasal spray Place 2 sprays into both nostrils daily.  Marland Kitchen glipiZIDE (GLUCOTROL) 5 MG tablet TAKE 1 TABLET (5 MG TOTAL) BY MOUTH DAILY BEFORE BREAKFAST.  Marland Kitchen JANUVIA 50 MG tablet TAKE 1 TABLET BY MOUTH EVERY DAY  . lisinopril-hydrochlorothiazide (ZESTORETIC) 20-12.5 MG tablet TAKE 1 TABLET BY MOUTH EVERY DAY IN THE MORNING  . LITE TOUCH LANCETS MISC   . LORazepam (ATIVAN) 0.5 MG tablet TAKE 2 TABLETS IN THE MORNING TAKE 1 IN THE EVENING  . metFORMIN (GLUCOPHAGE) 1000 MG tablet Take 1 tablet (1,000 mg total) by mouth 2 (two) times daily.  . Multiple Vitamin (MULTIVITAMIN WITH MINERALS) TABS tablet Take 1 tablet by mouth daily.  . Multiple Vitamins-Minerals (PRESERVISION AREDS 2+MULTI VIT) CAPS Take 2 capsules by mouth daily.  . pioglitazone (ACTOS)  30 MG tablet Take 1 tablet (30 mg total) by mouth daily.  . simvastatin (ZOCOR) 10 MG tablet TAKE 1 TABLET BY MOUTH EVERY DAY  . UNABLE TO FIND nitrofurantoin monohydrate/macrocrystals 100 mg capsule  . Vitamin D, Ergocalciferol, (DRISDOL) 1.25 MG (50000 UNIT) CAPS capsule Take 1 capsule (50,000 Units total) by mouth every 7 (seven) days.   No  facility-administered encounter medications on file as of 02/29/2020.    Allergies (verified) Morphine and related and Penicillins   History: Past Medical History:  Diagnosis Date  . Cancer ([place])    breast  . Diabetes mellitus without complication ([place])   . High blood cholesterol   . Hypertension    Past Surgical History:  Procedure Laterality Date  . ABDOMINAL HYSTERECTOMY    . JOINT REPLACEMENT    . MASTECTOMY    . ORIF ELBOW FRACTURE Left 07/01/2017   Procedure: OPEN REDUCTION INTERNAL FIXATION (ORIF) ELBOW/OLECRANON FRACTURE;  Surgeon: Shona Needles, MD;  Location: [facility];  Service: Orthopedics;  Laterality: Left;  . WRIST SURGERY     Family History  Problem Relation Age of Onset  . Obesity Grandchild    Social History   Socioeconomic History  . Marital status: Widowed    Spouse name: Not on file  . Number of children: 3  . Years of education: Not on file  . Highest education level: Not on file  Occupational History  . Occupation: retired  Tobacco Use  . Smoking status: Never Smoker  . Smokeless tobacco: Never Used  Vaping Use  . Vaping Use: Never used  Substance and Sexual Activity  . Alcohol use: No  . Drug use: No  . Sexual activity: Never  Other Topics Concern  . Not on file  Social History Narrative  . Not on file   Social Determinants of Health   Financial Resource Strain: Low Risk   . Difficulty of Paying Living Expenses: Not hard at all  Food Insecurity: No Food Insecurity  . Worried About Charity fundraiser in the Last Year: Never true  . Ran Out of Food in the Last Year: Never true  Transportation Needs: No Transportation Needs  . Lack of Transportation (Medical): No  . Lack of Transportation (Non-Medical): No  Physical Activity: Sufficiently Active  . Days of Exercise per Week: 5 days  . Minutes of Exercise per Session: 30 min  Stress: No Stress Concern Present  . Feeling of Stress : Not at all  Social Connections: Moderately  Integrated  . Frequency of Communication with Friends and Family: More than three times a week  . Frequency of Social Gatherings with Friends and Family: More than three times a week  . Attends Religious Services: More than 4 times per year  . Active Member of Clubs or Organizations: Yes  . Attends Archivist Meetings: More than 4 times per year  . Marital Status: Widowed    Tobacco Counseling Counseling given: Not Answered   Clinical Intake:  Pre-visit preparation completed: Yes  Pain : No/denies pain     Nutritional Risks: None Diabetes: Yes CBG done?: No Did pt. bring in CBG monitor from home?: No  How often do you need to have someone help you when you read instructions, pamphlets, or other written materials from your doctor or pharmacy?: 1 - Never What is the last grade level you completed in school?: 11th grade  Diabetic? yes  Interpreter Needed?: No  Information entered by :: Bharat Antillon N. [place] Guitar, LPN  Activities of Daily Living In your present state of health, do you have any difficulty performing the following activities: 02/29/2020  Hearing? N  Vision? N  Difficulty concentrating or making decisions? N  Walking or climbing stairs? N  Dressing or bathing? N  Doing errands, shopping? N  Preparing Food and eating ? N  Using the Toilet? N  In the past six months, have you accidently leaked urine? N  Do you have problems with loss of bowel control? N  Managing your Medications? N  Managing your Finances? N  Housekeeping or managing your Housekeeping? N  Some recent data might be hidden    Patient Care Team: Marrian Salvage, [place] as PCP - General (Internal Medicine)  Indicate any recent Medical Services you may have received from other than [facility] providers in the past year (date may be approximate).     Assessment:   This is a routine wellness examination for Melanie Hughes.  Hearing/Vision screen No exam data present  Dietary issues and  exercise activities discussed: Current Exercise Habits: Home exercise routine, Type of exercise: walking, Time (Minutes): 30, Frequency (Times/Week): 5, Weekly Exercise (Minutes/Week): 150, Intensity: Mild, Exercise limited by: neurologic condition(s) (recently dx with Parkinson's Disease)  Goals    . Patient Stated    . Patient Stated     Continue to eat healthy to lower HGB A1c, stay active physically and socially, get plenty of sleep, increase the amount of water I drink daily.      Depression Screen PHQ 2/9 Scores 02/29/2020 02/29/2020 02/26/2019 02/16/2018  PHQ - 2 Score 0 0 1 1  PHQ- 9 Score - - - 3    Fall Risk Fall Risk  02/29/2020 02/26/2019 02/16/2018 01/12/2018  Falls in the past year? 1 1 Yes Yes  Number falls in past yr: 0 1 2 or more 2 or more  Injury with Fall? 0 1 Yes Yes  Risk Factor Category  - - High Fall Risk -  Risk for fall due to : No Fall Risks Impaired mobility;Impaired balance/gait Impaired balance/gait -  Follow up Falls evaluation completed Falls prevention discussed;Education provided Education provided;Falls prevention discussed Falls evaluation completed    Any stairs in or around the home? Yes  If so, are there any without handrails? No  Home free of loose throw rugs in walkways, pet beds, electrical cords, etc? Yes  Adequate lighting in your home to reduce risk of falls? Yes   ASSISTIVE DEVICES UTILIZED TO PREVENT FALLS:  Life alert? Yes  Use of a cane, walker or w/c? Yes  Grab bars in the bathroom? No  Shower chair or bench in shower? Yes  Elevated toilet seat or a handicapped toilet? Yes   TIMED UP AND GO:  Was the test performed? No .  Length of time to ambulate 10 feet: 0 sec.   Gait slow and steady without use of assistive device  Cognitive Function: MMSE - Mini Mental State Exam 02/16/2018  Orientation to time 5  Orientation to Place 5  Registration 3  Attention/ Calculation 5  Recall 3  Language- name 2 objects 2  Language- repeat 1    Language- follow 3 step command 3  Language- read & follow direction 1  Write a sentence 1  Copy design 1  Total score 30     6CIT Screen 02/26/2019  What Year? 0 points  What month? 0 points  What time? 0 points  Count back from 20 0 points  Months in reverse 2  points  Repeat phrase 4 points  Total Score 6    Immunizations Immunization History  Administered Date(s) Administered  . Influenza,inj,quad, With Preservative 04/08/2015  . Influenza-Unspecified 04/06/2016, 04/13/2018  . PFIZER SARS-COV-2 Vaccination 08/13/2019, 09/08/2019  . Tdap 07/05/2013  . Zoster 07/05/2013    TDAP status: Up to date Flu Vaccine status: Up to date Pneumococcal vaccine status: Declined,  Education has been provided regarding the importance of this vaccine but patient still declined. Advised may receive this vaccine at local pharmacy or Health Dept. Aware to provide a copy of the vaccination record if obtained from local pharmacy or Health Dept. Verbalized acceptance and understanding.  Covid-19 vaccine status: Completed vaccines  Qualifies for Shingles Vaccine? Yes   Zostavax completed No   Shingrix Completed?: No.    Education has been provided regarding the importance of this vaccine. Patient has been advised to call insurance company to determine out of pocket expense if they have not yet received this vaccine. Advised may also receive vaccine at local pharmacy or Health Dept. Verbalized acceptance and understanding.  Screening Tests Health Maintenance  Topic Date Due  . PNA vac Low Risk Adult (1 of 2 - PCV13) Never done  . OPHTHALMOLOGY EXAM  12/18/2019  . INFLUENZA VACCINE  02/03/2020  . FOOT EXAM  02/26/2020  . HEMOGLOBIN A1C  03/06/2020  . TETANUS/TDAP  07/06/2023  . DEXA SCAN  Completed  . COVID-19 Vaccine  Completed    Health Maintenance  Health Maintenance Due  Topic Date Due  . PNA vac Low Risk Adult (1 of 2 - PCV13) Never done  . OPHTHALMOLOGY EXAM  12/18/2019  .  INFLUENZA VACCINE  02/03/2020  . FOOT EXAM  02/26/2020    Colorectal cancer screening: No longer required.  Mammogram status: Completed 02/15/2020. Repeat every year Bone Density status: Completed 01/16/2018. Results reflect: Bone density results: OSTEOPOROSIS. Repeat every 2 years.  Lung Cancer Screening: (Low Dose CT Chest recommended if Age 32-80 years, 30 pack-year currently smoking OR have quit w/in 15years.) does not qualify.   Lung Cancer Screening Referral: no  Additional Screening:  Hepatitis C Screening: does not qualify; Completed no  Vision Screening: Recommended annual ophthalmology exams for early detection of glaucoma and other disorders of the eye. Is the patient up to date with their annual eye exam?  Yes  Who is the provider or what is the name of the office in which the patient attends annual eye exams? [facility] If pt is not established with a provider, would they like to be referred to a provider to establish care? No .   Dental Screening: Recommended annual dental exams for proper oral hygiene  Community Resource Referral / Chronic Care Management: CRR required this visit?  No   CCM required this visit?  No      Plan:     I have personally reviewed and noted the following in the patient's chart:   . Medical and social history . Use of alcohol, tobacco or illicit drugs  . Current medications and supplements . Functional ability and status . Nutritional status . Physical activity . Advanced directives . List of other physicians . Hospitalizations, surgeries, and ER visits in previous 12 months . Vitals . Screenings to include cognitive, depression, and falls . Referrals and appointments  In addition, I have reviewed and discussed with patient certain preventive protocols, quality metrics, and best practice recommendations. A written personalized care plan for preventive services as well as general preventive health recommendations  were  provided to patient.     Sheral Flow, LPN   08/15/1733   Nurse Notes:  Patient is cogitatively intact. There were no vitals filed for this visit. There is no height or weight on file to calculate BMI. Patient stated that she uses a cane for gait and balance issues.  Medical screening examination/treatment/procedure(s) were performed by non-physician practitioner and as supervising provider I was immediately available for consultation/collaboration.  I agree with above. Marrian Salvage, FNP

## 2020-03-03 ENCOUNTER — Other Ambulatory Visit: Payer: Self-pay

## 2020-03-03 ENCOUNTER — Ambulatory Visit (INDEPENDENT_AMBULATORY_CARE_PROVIDER_SITE_OTHER): Payer: Medicare Other | Admitting: Family

## 2020-03-03 VITALS — BP 110/68 | HR 69 | Temp 97.4°F | Ht 66.0 in | Wt 128.6 lb

## 2020-03-03 DIAGNOSIS — E785 Hyperlipidemia, unspecified: Secondary | ICD-10-CM | POA: Diagnosis not present

## 2020-03-03 DIAGNOSIS — E559 Vitamin D deficiency, unspecified: Secondary | ICD-10-CM

## 2020-03-03 DIAGNOSIS — M810 Age-related osteoporosis without current pathological fracture: Secondary | ICD-10-CM

## 2020-03-03 DIAGNOSIS — Z0000 Encounter for general adult medical examination without abnormal findings: Secondary | ICD-10-CM | POA: Diagnosis not present

## 2020-03-03 DIAGNOSIS — R634 Abnormal weight loss: Secondary | ICD-10-CM

## 2020-03-03 DIAGNOSIS — E119 Type 2 diabetes mellitus without complications: Secondary | ICD-10-CM | POA: Diagnosis not present

## 2020-03-03 MED ORDER — DENOSUMAB 60 MG/ML ~~LOC~~ SOSY
60.0000 mg | PREFILLED_SYRINGE | Freq: Once | SUBCUTANEOUS | Status: AC
  Administered 2020-03-03: 60 mg via SUBCUTANEOUS

## 2020-03-03 MED ORDER — LORAZEPAM 0.5 MG PO TABS: ORAL_TABLET | ORAL | 1 refills | Status: DC

## 2020-03-03 NOTE — Addendum Note (Signed)
Addended by: Marcina Millard on: 03/03/2020 09:47 AM   Modules accepted: Orders

## 2020-03-03 NOTE — Progress Notes (Signed)
Melanie Hughes is a 84 y.o. female with the following history as recorded in EpicCare:  Patient Active Problem List   Diagnosis Date Noted  . Parkinson's disease (HCC) 02/26/2019  . Concussion 11/22/2018  . Contusion of face 11/22/2018  . Chondroma 10/14/2017  . Normocytic anemia 06/29/2017  . Hx of breast cancer 06/29/2017  . Hypomagnesemia 06/29/2017  . Hyperlipidemia 06/29/2017  . Diabetes mellitus without complication (HCC) 06/28/2017  . Hypertension 06/28/2017  . Closed left hip fracture, initial encounter (HCC) 06/28/2017  . Closed fracture of left olecranon process 06/28/2017  . Anxiety 06/28/2017  . Closed displaced fracture of greater trochanter of left femur (HCC)     Current Outpatient Medications  Medication Sig Dispense Refill  . aspirin EC 81 MG tablet Take 81 mg by mouth daily.    . bisacodyl (DULCOLAX) 5 MG EC tablet Take 1 tablet (5 mg total) by mouth daily as needed for moderate constipation. 30 tablet 0  . blood glucose meter kit and supplies KIT Use up to 4x daily as directed---diagnosis code E11.9 1 each 0  . Calcium Carb-Cholecalciferol (CALCIUM 500+D PO) Calcium 500 + D    . carbidopa-levodopa (SINEMET IR) 25-100 MG tablet Take 1 tablet by mouth 3 (three) times daily.    . CONTOUR NEXT TEST test strip   1  . [facility]S COQ-10 200 MG capsule TAKE 1 CAPSULE BY MOUTH EVERY DAY 90 capsule 0  . escitalopram (LEXAPRO) 20 MG tablet Take 1 tablet (20 mg total) by mouth at bedtime. 90 tablet 3  . fluticasone (FLONASE) 50 MCG/ACT nasal spray Place 2 sprays into both nostrils daily. 16 g 11  . glipiZIDE (GLUCOTROL) 5 MG tablet TAKE 1 TABLET (5 MG TOTAL) BY MOUTH DAILY BEFORE BREAKFAST. 90 tablet 1  . JANUVIA 50 MG tablet TAKE 1 TABLET BY MOUTH EVERY DAY 90 tablet 1  . lisinopril-hydrochlorothiazide (ZESTORETIC) 20-12.5 MG tablet TAKE 1 TABLET BY MOUTH EVERY DAY IN THE MORNING 90 tablet 3  . LITE TOUCH LANCETS MISC   1  . LORazepam (ATIVAN) 0.5 MG tablet TAKE 2 TABLETS IN THE  MORNING TAKE 1 IN THE EVENING 90 tablet 1  . metFORMIN (GLUCOPHAGE) 1000 MG tablet Take 1 tablet (1,000 mg total) by mouth 2 (two) times daily. 180 tablet 1  . Multiple Vitamin (MULTIVITAMIN WITH MINERALS) TABS tablet Take 1 tablet by mouth daily.    . Multiple Vitamins-Minerals (PRESERVISION AREDS 2+MULTI VIT) CAPS Take 2 capsules by mouth daily.    . pioglitazone (ACTOS) 30 MG tablet Take 1 tablet (30 mg total) by mouth daily. 90 tablet 3  . simvastatin (ZOCOR) 10 MG tablet TAKE 1 TABLET BY MOUTH EVERY DAY 90 tablet 1  . UNABLE TO FIND nitrofurantoin monohydrate/macrocrystals 100 mg capsule    . Vitamin D, Ergocalciferol, (DRISDOL) 1.25 MG (50000 UNIT) CAPS capsule Take 1 capsule (50,000 Units total) by mouth every 7 (seven) days. 12 capsule 0   No current facility-administered medications for this visit.    Allergies: Morphine and related and Penicillins  Past Medical History:  Diagnosis Date  . Cancer (HCC)    breast  . Diabetes mellitus without complication (HCC)   . High blood cholesterol   . Hypertension     Past Surgical History:  Procedure Laterality Date  . ABDOMINAL HYSTERECTOMY    . JOINT REPLACEMENT    . MASTECTOMY    . ORIF ELBOW FRACTURE Left 07/01/2017   Procedure: OPEN REDUCTION INTERNAL FIXATION (ORIF) ELBOW/OLECRANON FRACTURE;  Surgeon: Truitt Merle  P, MD;  Location: [facility];  Service: Orthopedics;  Laterality: Left;  . WRIST SURGERY      Family History  Problem Relation Age of Onset  . Obesity Grandchild     Social History   Tobacco Use  . Smoking status: Never Smoker  . Smokeless tobacco: Never Used  Substance Use Topics  . Alcohol use: No    Subjective:  Presents for yearly CPE: accompanied by daughter; no new acute concerns today; has lost 10 pounds since 02/2019 but feels like she has not been eating as much junk; under care of neurology for Parkinson's Disease in [place], [place]- has not had any recent falls; in general, notes she is doing "very  well." Would like to get Prolia injection today as well;   Review of Systems  Constitutional: Positive for weight loss.  HENT: Negative.   Eyes: Negative.   Respiratory: Negative.   Cardiovascular: Negative.   Gastrointestinal: Negative.   Genitourinary: Negative.   Musculoskeletal: Negative.   Skin: Negative.   Neurological: Negative.   Endo/Heme/Allergies: Negative.   Psychiatric/Behavioral: Negative.      Objective:  Vitals:   03/03/20 0850  BP: 110/68  Pulse: 69  Temp: (!) 97.4 F (36.3 C)  SpO2: 95%  Weight: 128 lb 9.6 oz (58.3 kg)  Height: $Remove'5\' 6"'vUssKtu$  (1.676 m)    General: Well developed, well nourished, in no acute distress  Skin : Warm and dry.  Head: Normocephalic and atraumatic  Eyes: Sclera and conjunctiva clear; pupils round and reactive to light; extraocular movements intact  Ears: External normal; canals clear; tympanic membranes normal  Oropharynx: Pink, supple. No suspicious lesions  Neck: Supple without thyromegaly, adenopathy  Lungs: Respirations unlabored; clear to auscultation bilaterally without wheeze, rales, rhonchi  CVS exam: normal rate and regular rhythm.  Abdomen: Soft; nontender; nondistended; normoactive bowel sounds; no masses or hepatosplenomegaly  Musculoskeletal: No deformities; no active joint inflammation  Extremities: No edema, cyanosis, clubbing  Vessels: Symmetric bilaterally  Neurologic: Alert and oriented; speech intact; face symmetrical; moves all extremities well; CNII-XII intact without focal deficit  Assessment:  1. PE (physical exam), annual   2. Hyperlipidemia, unspecified hyperlipidemia type   3. Diabetes mellitus without complication ([place])   4. Vitamin D deficiency   5. Weight loss     Plan:  Age appropriate preventive healthcare needs addressed; encouraged regular eye doctor and dental exams; encouraged regular exercise; will update labs and refills as needed today; follow-up to be determined;  This visit occurred during  the SARS-CoV-2 public health emergency.  Safety protocols were in place, including screening questions prior to the visit, additional usage of staff PPE, and extensive cleaning of exam room while observing appropriate contact time as indicated for disinfecting solutions.     No follow-ups on file.  Orders Placed This Encounter  Procedures  . CBC with Differential/Platelet    Standing Status:   Future    Standing Expiration Date:   03/03/2021  . Comp Met (CMET)    Standing Status:   Future    Standing Expiration Date:   03/03/2021  . Lipid panel    Standing Status:   Future    Standing Expiration Date:   03/03/2021  . Hemoglobin A1c    Standing Status:   Future    Standing Expiration Date:   03/03/2021  . Vitamin D (25 hydroxy)    Standing Status:   Future    Standing Expiration Date:   03/03/2021  . TSH    Standing  Status:   Future    Standing Expiration Date:   03/03/2021    Requested Prescriptions   Signed Prescriptions Disp Refills  . LORazepam (ATIVAN) 0.5 MG tablet 90 tablet 1    Sig: TAKE 2 TABLETS IN THE MORNING TAKE 1 IN THE EVENING

## 2020-03-03 NOTE — Addendum Note (Signed)
Addended by: Hazle Quant on: 03/03/2020 09:27 AM   Modules accepted: Orders

## 2020-03-04 LAB — VITAMIN D 25 HYDROXY (VIT D DEFICIENCY, FRACTURES): Vit D, 25-Hydroxy: 75 ng/mL (ref 30–100)

## 2020-03-04 LAB — CBC WITH DIFFERENTIAL/PLATELET
Absolute Monocytes: 360 cells/uL (ref 200–950)
Basophils Absolute: 18 cells/uL (ref 0–200)
Basophils Relative: 0.3 %
Eosinophils Absolute: 48 cells/uL (ref 15–500)
Eosinophils Relative: 0.8 %
HCT: 35.5 % (ref 35.0–45.0)
Hemoglobin: 11.5 g/dL — ABNORMAL LOW (ref 11.7–15.5)
Lymphs Abs: 474 cells/uL — ABNORMAL LOW (ref 850–3900)
MCH: 30.2 pg (ref 27.0–33.0)
MCHC: 32.4 g/dL (ref 32.0–36.0)
MCV: 93.2 fL (ref 80.0–100.0)
MPV: 11.9 fL (ref 7.5–12.5)
Monocytes Relative: 6 %
Neutro Abs: 5100 cells/uL (ref 1500–7800)
Neutrophils Relative %: 85 %
Platelets: 182 10*3/uL (ref 140–400)
RBC: 3.81 10*6/uL (ref 3.80–5.10)
RDW: 11.8 % (ref 11.0–15.0)
Total Lymphocyte: 7.9 %
WBC: 6 10*3/uL (ref 3.8–10.8)

## 2020-03-04 LAB — LIPID PANEL
Cholesterol: 137 mg/dL (ref <200)
HDL: 69 mg/dL (ref >=50)
LDL Cholesterol (Calc): 54 mg/dL (calc)
Non-HDL Cholesterol (Calc): 68 mg/dL (calc) (ref <130)
Total CHOL/HDL Ratio: 2 (calc) (ref <5.0)
Triglycerides: 61 mg/dL (ref <150)

## 2020-03-04 LAB — COMPREHENSIVE METABOLIC PANEL
AG Ratio: 1.7 (calc) (ref 1.0–2.5)
ALT: 8 U/L (ref 6–29)
AST: 14 U/L (ref 10–35)
Albumin: 4.2 g/dL (ref 3.6–5.1)
Alkaline phosphatase (APISO): 56 U/L (ref 37–153)
BUN/Creatinine Ratio: 30 (calc) — ABNORMAL HIGH (ref 6–22)
BUN: 28 mg/dL — ABNORMAL HIGH (ref 7–25)
CO2: 30 mmol/L (ref 20–32)
Calcium: 9.6 mg/dL (ref 8.6–10.4)
Chloride: 100 mmol/L (ref 98–110)
Creat: 0.93 mg/dL — ABNORMAL HIGH (ref 0.60–0.88)
Globulin: 2.5 g/dL (calc) (ref 1.9–3.7)
Glucose, Bld: 201 mg/dL — ABNORMAL HIGH (ref 65–99)
Potassium: 5 mmol/L (ref 3.5–5.3)
Sodium: 138 mmol/L (ref 135–146)
Total Bilirubin: 0.4 mg/dL (ref 0.2–1.2)
Total Protein: 6.7 g/dL (ref 6.1–8.1)

## 2020-03-04 LAB — HEMOGLOBIN A1C
Hgb A1c MFr Bld: 7.2 % of total Hgb — ABNORMAL HIGH (ref <5.7)
Mean Plasma Glucose: 160 (calc)
eAG (mmol/L): 8.9 (calc)

## 2020-03-04 LAB — TSH: TSH: 0.89 mIU/L (ref 0.40–4.50)

## 2020-04-01 ENCOUNTER — Encounter: Payer: Self-pay | Admitting: Family

## 2020-04-02 ENCOUNTER — Other Ambulatory Visit: Payer: Self-pay | Admitting: Family

## 2020-04-02 MED ORDER — GLIPIZIDE 5 MG PO TABS: 5.0000 mg | ORAL_TABLET | Freq: Every day | ORAL | 3 refills | Status: DC

## 2020-04-03 ENCOUNTER — Other Ambulatory Visit: Payer: Self-pay | Admitting: Family

## 2020-04-03 NOTE — Telephone Encounter (Signed)
Patient's daughter called Trixie Rude was wondering if there was any way to resend glipiZIDE (GLUCOTROL) 5 MG tablet  To [id number] [place], [address] [place].   The pharmacy has said that they did not get the prescription. Please call Anderson [place] when rx sent [phone]

## 2020-04-04 NOTE — Telephone Encounter (Signed)
Pharmacy staff says he can see that Glipizide on hold. States med will be able to be filled at 10/2 due to insurance.  Pt notified that medication will be ready at requested pharmacy on 10/2; she verb understanding.

## 2020-04-17 ENCOUNTER — Ambulatory Visit: Payer: Medicare Other | Attending: Internal Medicine

## 2020-04-17 ENCOUNTER — Ambulatory Visit: Payer: Medicare Other

## 2020-04-17 DIAGNOSIS — Z23 Encounter for immunization: Secondary | ICD-10-CM

## 2020-04-17 NOTE — Progress Notes (Signed)
   Covid-19 Vaccination Clinic  Name:  Melanie Hughes    MRN: [id number] DOB: Mar 23, 1936  04/17/2020  Melanie Hughes was observed post Covid-19 immunization for 15 minutes without incident. She was provided with Vaccine Information Sheet and instruction to access the V-Safe system.   Melanie Hughes was instructed to call 911 with any severe reactions post vaccine: Marland Kitchen Difficulty breathing  . Swelling of face and throat  . A fast heartbeat  . A bad rash all over body  . Dizziness and weakness

## 2020-05-12 ENCOUNTER — Other Ambulatory Visit: Payer: Self-pay | Admitting: Family

## 2020-05-12 MED ORDER — SITAGLIPTIN PHOSPHATE 50 MG PO TABS: 50.0000 mg | ORAL_TABLET | Freq: Every day | ORAL | 3 refills | Status: DC

## 2020-05-19 ENCOUNTER — Other Ambulatory Visit: Payer: Self-pay | Admitting: Family

## 2020-05-19 ENCOUNTER — Encounter: Payer: Self-pay | Admitting: Family

## 2020-06-02 ENCOUNTER — Other Ambulatory Visit: Payer: Self-pay | Admitting: Family

## 2020-06-02 MED ORDER — GLIPIZIDE 5 MG PO TABS: 5.0000 mg | ORAL_TABLET | Freq: Every day | ORAL | 3 refills | Status: DC

## 2020-06-02 NOTE — Telephone Encounter (Signed)
Patient called [facility] 05/30/20  Caller reports that she has some questions regarding her mother in law's diabetes medication. Caller states her blood sugar has been high today.  [facility] advised to : advised since it isn't time to refill rx, i cannot authorize refill. pt takes actos and metformin as well. advised to cont her other meds as ordered and to call office on monday. they can authorize a refill. verbalizes understanding.  Patient understood and sent a MyChart message.

## 2020-06-02 NOTE — Telephone Encounter (Signed)
   Son calling to request new order for glipiZIDE (GLUCOTROL) 5 MG tablet. She has no medication remaining. She had been mistakenly taking 2 tablets instead of 1  [facility]acy:[id number] [place], [place].

## 2020-06-06 ENCOUNTER — Other Ambulatory Visit: Payer: Self-pay | Admitting: Family

## 2020-06-06 MED ORDER — PIOGLITAZONE HCL 30 MG PO TABS: 30.0000 mg | ORAL_TABLET | Freq: Every day | ORAL | 3 refills | Status: DC

## 2020-07-19 ENCOUNTER — Other Ambulatory Visit: Payer: Self-pay | Admitting: Family

## 2020-08-30 ENCOUNTER — Other Ambulatory Visit: Payer: Self-pay | Admitting: Family

## 2020-09-01 ENCOUNTER — Other Ambulatory Visit: Payer: Self-pay | Admitting: Family

## 2020-09-05 ENCOUNTER — Ambulatory Visit: Payer: Medicare Other | Admitting: Family

## 2020-09-09 ENCOUNTER — Encounter: Payer: Self-pay | Admitting: Family

## 2020-09-10 ENCOUNTER — Ambulatory Visit: Payer: Medicare Other | Admitting: Family

## 2020-09-10 ENCOUNTER — Other Ambulatory Visit: Payer: Self-pay

## 2020-09-10 ENCOUNTER — Encounter: Payer: Self-pay | Admitting: Family

## 2020-09-10 VITALS — BP 120/74 | HR 77 | Temp 97.5°F | Ht 66.0 in | Wt 124.0 lb

## 2020-09-10 DIAGNOSIS — E538 Deficiency of other specified B group vitamins: Secondary | ICD-10-CM

## 2020-09-10 DIAGNOSIS — R350 Frequency of micturition: Secondary | ICD-10-CM

## 2020-09-10 DIAGNOSIS — R5383 Other fatigue: Secondary | ICD-10-CM

## 2020-09-10 DIAGNOSIS — E559 Vitamin D deficiency, unspecified: Secondary | ICD-10-CM | POA: Diagnosis not present

## 2020-09-10 DIAGNOSIS — E119 Type 2 diabetes mellitus without complications: Secondary | ICD-10-CM

## 2020-09-10 LAB — CBC WITH DIFFERENTIAL/PLATELET
Basophils Absolute: 0 10*3/uL (ref 0.0–0.1)
Basophils Relative: 0.4 % (ref 0.0–3.0)
Eosinophils Absolute: 0 10*3/uL (ref 0.0–0.7)
Eosinophils Relative: 0.6 % (ref 0.0–5.0)
HCT: 34.1 % — ABNORMAL LOW (ref 36.0–46.0)
Hemoglobin: 11.2 g/dL — ABNORMAL LOW (ref 12.0–15.0)
Lymphocytes Relative: 8.7 % — ABNORMAL LOW (ref 12.0–46.0)
Lymphs Abs: 0.5 10*3/uL — ABNORMAL LOW (ref 0.7–4.0)
MCHC: 32.9 g/dL (ref 30.0–36.0)
MCV: 88.7 fl (ref 78.0–100.0)
Monocytes Absolute: 0.5 10*3/uL (ref 0.1–1.0)
Monocytes Relative: 7.2 % (ref 3.0–12.0)
Neutro Abs: 5.2 10*3/uL (ref 1.4–7.7)
Neutrophils Relative %: 83.1 % — ABNORMAL HIGH (ref 43.0–77.0)
Platelets: 163 10*3/uL (ref 150.0–400.0)
RBC: 3.85 Mil/uL — ABNORMAL LOW (ref 3.87–5.11)
RDW: 14.2 % (ref 11.5–15.5)
WBC: 6.3 10*3/uL (ref 4.0–10.5)

## 2020-09-10 LAB — COMPREHENSIVE METABOLIC PANEL
ALT: 10 U/L (ref 0–35)
AST: 15 U/L (ref 0–37)
Albumin: 4 g/dL (ref 3.5–5.2)
Alkaline Phosphatase: 38 U/L — ABNORMAL LOW (ref 39–117)
BUN: 26 mg/dL — ABNORMAL HIGH (ref 6–23)
CO2: 30 mEq/L (ref 19–32)
Calcium: 9.4 mg/dL (ref 8.4–10.5)
Chloride: 100 mEq/L (ref 96–112)
Creatinine, Ser: 0.88 mg/dL (ref 0.40–1.20)
GFR: 60.96 mL/min (ref >60.00)
Glucose, Bld: 174 mg/dL — ABNORMAL HIGH (ref 70–99)
Potassium: 5 mEq/L (ref 3.5–5.1)
Sodium: 136 mEq/L (ref 135–145)
Total Bilirubin: 0.4 mg/dL (ref 0.2–1.2)
Total Protein: 6.7 g/dL (ref 6.0–8.3)

## 2020-09-10 LAB — HEMOGLOBIN A1C: Hgb A1c MFr Bld: 7.5 % — ABNORMAL HIGH (ref 4.6–6.5)

## 2020-09-10 NOTE — Patient Instructions (Signed)
Please hold the Glipizide until further notice ( Glucotrol);

## 2020-09-10 NOTE — Progress Notes (Signed)
Melanie Hughes is a 85 y.o. female with the following history as recorded in EpicCare:  Patient Active Problem List   Diagnosis Date Noted  . Parkinson's disease ([place]) 02/26/2019  . Concussion 11/22/2018  . Contusion of face 11/22/2018  . Chondroma 10/14/2017  . Normocytic anemia 06/29/2017  . Hx of breast cancer 06/29/2017  . Hypomagnesemia 06/29/2017  . Hyperlipidemia 06/29/2017  . Diabetes mellitus without complication ([place]) 97/35/3299  . Hypertension 06/28/2017  . Closed left hip fracture, initial encounter ([place]) 06/28/2017  . Closed fracture of left olecranon process 06/28/2017  . Anxiety 06/28/2017  . Closed displaced fracture of greater trochanter of left femur (HCC)     Current Outpatient Medications  Medication Sig Dispense Refill  . aspirin EC 81 MG tablet Take 81 mg by mouth daily.    . bisacodyl (DULCOLAX) 5 MG EC tablet Take 1 tablet (5 mg total) by mouth daily as needed for moderate constipation. 30 tablet 0  . blood glucose meter kit and supplies KIT Use up to 4x daily as directed---diagnosis code E11.9 1 each 0  . Calcium Carb-Cholecalciferol (CALCIUM 500+D PO) Calcium 500 + D    . carbidopa-levodopa (SINEMET IR) 25-100 MG tablet Take 1 tablet by mouth 3 (three) times daily.    . CONTOUR NEXT TEST test strip   1  . [facility]S COQ-10 200 MG capsule TAKE 1 CAPSULE BY MOUTH EVERY DAY 90 capsule 0  . escitalopram (LEXAPRO) 20 MG tablet TAKE 1 TABLET BY MOUTH EVERYDAY AT BEDTIME 90 tablet 3  . fluticasone (FLONASE) 50 MCG/ACT nasal spray Place 2 sprays into both nostrils daily. 16 g 11  . lisinopril-hydrochlorothiazide (ZESTORETIC) 20-12.5 MG tablet TAKE 1 TABLET BY MOUTH EVERY DAY IN THE MORNING 90 tablet 3  . LITE TOUCH LANCETS MISC   1  . LORazepam (ATIVAN) 0.5 MG tablet TAKE 2 TABLETS IN THE MORNING TAKE 1 IN THE EVENING 90 tablet 1  . metFORMIN (GLUCOPHAGE) 1000 MG tablet TAKE 1 TABLET BY MOUTH TWICE A DAY 180 tablet 1  . Multiple Vitamin (MULTIVITAMIN WITH MINERALS) TABS  tablet Take 1 tablet by mouth daily.    . Multiple Vitamins-Minerals (PRESERVISION AREDS 2+MULTI VIT) CAPS Take 2 capsules by mouth daily.    . pioglitazone (ACTOS) 30 MG tablet Take 1 tablet (30 mg total) by mouth daily. 90 tablet 3  . simvastatin (ZOCOR) 10 MG tablet TAKE 1 TABLET BY MOUTH EVERY DAY 90 tablet 1  . sitaGLIPtin (JANUVIA) 50 MG tablet Take 1 tablet (50 mg total) by mouth daily. 90 tablet 3  . UNABLE TO FIND nitrofurantoin monohydrate/macrocrystals 100 mg capsule    . glipiZIDE (GLUCOTROL) 5 MG tablet Take 1 tablet (5 mg total) by mouth daily before breakfast. (Patient not taking: Reported on 09/10/2020) 90 tablet 3  . Vitamin D, Ergocalciferol, (DRISDOL) 1.25 MG (50000 UNIT) CAPS capsule Take 1 capsule (50,000 Units total) by mouth every 7 (seven) days. (Patient not taking: Reported on 09/10/2020) 12 capsule 0   No current facility-administered medications for this visit.    Allergies: Morphine and related and Penicillins  Past Medical History:  Diagnosis Date  . Cancer ([place])    breast  . Diabetes mellitus without complication ([place])   . High blood cholesterol   . Hypertension     Past Surgical History:  Procedure Laterality Date  . ABDOMINAL HYSTERECTOMY    . JOINT REPLACEMENT    . MASTECTOMY    . ORIF ELBOW FRACTURE Left 07/01/2017   Procedure: OPEN REDUCTION  INTERNAL FIXATION (ORIF) ELBOW/OLECRANON FRACTURE;  Surgeon: Shona Needles, MD;  Location: [facility];  Service: Orthopedics;  Laterality: Left;  . WRIST SURGERY      Family History  Problem Relation Age of Onset  . Obesity Grandchild     Social History   Tobacco Use  . Smoking status: Never Smoker  . Smokeless tobacco: Never Used  Substance Use Topics  . Alcohol use: No    Subjective:  6 month follow-up; family member had reached out yesterday to indicate that having increased problems with memory/ agitation- has been asked to follow-up with neurology; would like to get Urine culture today to rule out  possible underlying UTI;  Denies any concerns for low blood sugar and brings in readings for review today; Has lost 4 more pounds since OV in August 2021;     Objective:  Vitals:   09/10/20 0847  BP: 120/74  Pulse: 77  Temp: (!) 97.5 F (36.4 C)  TempSrc: Oral  SpO2: 95%  Weight: 124 lb (56.2 kg)  Height: _0  (1.676 m)    General: Well developed, well nourished, in no acute distress  Skin : Warm and dry.  Head: Normocephalic and atraumatic  Eyes: Sclera and conjunctiva clear; pupils round and reactive to light; extraocular movements intact  Ears: External normal; canals clear; tympanic membranes normal  Oropharynx: Pink, supple. No suspicious lesions  Neck: Supple without thyromegaly, adenopathy  Lungs: Respirations unlabored; clear to auscultation bilaterally without wheeze, rales, rhonchi  CVS exam: normal rate and regular rhythm.  Neurologic: Alert and oriented; speech intact; face symmetrical; moves all extremities well; CNII-XII intact without focal deficit   Assessment:  1. Diabetes mellitus without complication ([place])   2. Low vitamin B12 level   3. Other fatigue   4. Urinary frequency   5. Vitamin D deficiency     Plan:  1. In reviewing blood sugar logs, am actually concerned for episodes of low blood sugar; am fasting readings are in the 80s; she is asked to hold Glipizide at this time; may need to consider lowering other medication as well; follow-up to be determined; 2. Check B12 level today; 3. Check TSH today; encouraged to follow up with neurology as well to see if medications need to be adjusted; 4. Check U/A, urine culture; 5. Check Vitamin D level;  This visit occurred during the SARS-CoV-2 public health emergency.  Safety protocols were in place, including screening questions prior to the visit, additional usage of staff PPE, and extensive cleaning of exam room while observing appropriate contact time as indicated for disinfecting solutions.      No  follow-ups on file.  Orders Placed This Encounter  Procedures  . Urine Culture    Standing Status:   Future    Number of Occurrences:   1    Standing Expiration Date:   09/10/2021  . CBC with Differential/Platelet    Standing Status:   Future    Number of Occurrences:   1    Standing Expiration Date:   09/10/2021  . Comp Met (CMET)    Standing Status:   Future    Number of Occurrences:   1    Standing Expiration Date:   09/10/2021  . B12    Standing Status:   Future    Number of Occurrences:   1    Standing Expiration Date:   09/10/2021  . TSH    Standing Status:   Future    Number of Occurrences:  1    Standing Expiration Date:   09/10/2021  . Hemoglobin A1c    Standing Status:   Future    Number of Occurrences:   1    Standing Expiration Date:   09/10/2021  . Vitamin D (25 hydroxy)    Standing Status:   Future    Number of Occurrences:   1    Standing Expiration Date:   09/10/2021    Requested Prescriptions    No prescriptions requested or ordered in this encounter

## 2020-09-11 LAB — URINE CULTURE

## 2020-09-11 LAB — VITAMIN B12: Vitamin B-12: 1140 pg/mL — ABNORMAL HIGH (ref 211–911)

## 2020-09-11 LAB — VITAMIN D 25 HYDROXY (VIT D DEFICIENCY, FRACTURES): VITD: 52.26 ng/mL (ref 30.00–100.00)

## 2020-09-11 LAB — TSH: TSH: 1.14 u[IU]/mL (ref 0.35–4.50)

## 2020-10-07 ENCOUNTER — Telehealth: Payer: Self-pay | Admitting: Family

## 2020-10-07 NOTE — Telephone Encounter (Signed)
Authorization initiated through [employer] for Prolia on 4.5.22

## 2020-10-13 ENCOUNTER — Encounter: Payer: Self-pay | Admitting: Family

## 2020-10-13 ENCOUNTER — Other Ambulatory Visit: Payer: Self-pay

## 2020-10-13 ENCOUNTER — Ambulatory Visit: Payer: Medicare Other | Admitting: Family

## 2020-10-13 VITALS — BP 110/60 | HR 71 | Temp 97.6°F | Ht 66.0 in | Wt 117.8 lb

## 2020-10-13 DIAGNOSIS — M810 Age-related osteoporosis without current pathological fracture: Secondary | ICD-10-CM

## 2020-10-13 DIAGNOSIS — F419 Anxiety disorder, unspecified: Secondary | ICD-10-CM | POA: Diagnosis not present

## 2020-10-13 DIAGNOSIS — E119 Type 2 diabetes mellitus without complications: Secondary | ICD-10-CM

## 2020-10-13 DIAGNOSIS — E559 Vitamin D deficiency, unspecified: Secondary | ICD-10-CM

## 2020-10-13 MED ORDER — DENOSUMAB 60 MG/ML ~~LOC~~ SOSY
60.0000 mg | PREFILLED_SYRINGE | Freq: Once | SUBCUTANEOUS | Status: AC
  Administered 2020-10-13: 60 mg via SUBCUTANEOUS

## 2020-10-13 MED ORDER — LORAZEPAM 0.5 MG PO TABS: 0.5000 mg | ORAL_TABLET | Freq: Three times a day (TID) | ORAL | 1 refills | Status: DC | PRN

## 2020-10-13 MED ORDER — LORAZEPAM 0.5 MG PO TABS: 0.5000 mg | ORAL_TABLET | Freq: Two times a day (BID) | ORAL | 1 refills | Status: DC | PRN

## 2020-10-13 NOTE — Progress Notes (Signed)
Polia shot given in right arm.  Pt tolerated well.  [place]: [phone] Lot: [id number] EXP: 05/24

## 2020-10-13 NOTE — Progress Notes (Signed)
Melanie Hughes is a 85 y.o. female with the following history as recorded in EpicCare:  Patient Active Problem List   Diagnosis Date Noted  . Parkinson's disease ([place]) 02/26/2019  . Concussion 11/22/2018  . Contusion of face 11/22/2018  . Chondroma 10/14/2017  . Normocytic anemia 06/29/2017  . Hx of breast cancer 06/29/2017  . Hypomagnesemia 06/29/2017  . Hyperlipidemia 06/29/2017  . Diabetes mellitus without complication ([place]) 60/45/4098  . Hypertension 06/28/2017  . Closed left hip fracture, initial encounter ([place]) 06/28/2017  . Closed fracture of left olecranon process 06/28/2017  . Anxiety 06/28/2017  . Closed displaced fracture of greater trochanter of left femur (HCC)     Current Outpatient Medications  Medication Sig Dispense Refill  . aspirin EC 81 MG tablet Take 81 mg by mouth daily.    . bisacodyl (DULCOLAX) 5 MG EC tablet Take 1 tablet (5 mg total) by mouth daily as needed for moderate constipation. 30 tablet 0  . blood glucose meter kit and supplies KIT Use up to 4x daily as directed---diagnosis code E11.9 1 each 0  . Calcium Carb-Cholecalciferol (CALCIUM 500+D PO) Calcium 500 + D    . carbidopa-levodopa (SINEMET IR) 25-100 MG tablet Take 1 tablet by mouth 3 (three) times daily.    . CONTOUR NEXT TEST test strip   1  . [facility]S COQ-10 200 MG capsule TAKE 1 CAPSULE BY MOUTH EVERY DAY 90 capsule 0  . escitalopram (LEXAPRO) 20 MG tablet TAKE 1 TABLET BY MOUTH EVERYDAY AT BEDTIME 90 tablet 3  . fluticasone (FLONASE) 50 MCG/ACT nasal spray Place 2 sprays into both nostrils daily. 16 g 11  . glipiZIDE (GLUCOTROL) 5 MG tablet Take 1 tablet (5 mg total) by mouth daily before breakfast. 90 tablet 3  . lisinopril-hydrochlorothiazide (ZESTORETIC) 20-12.5 MG tablet TAKE 1 TABLET BY MOUTH EVERY DAY IN THE MORNING 90 tablet 3  . LITE TOUCH LANCETS MISC   1  . metFORMIN (GLUCOPHAGE) 1000 MG tablet TAKE 1 TABLET BY MOUTH TWICE A DAY 180 tablet 1  . Multiple Vitamin (MULTIVITAMIN WITH  MINERALS) TABS tablet Take 1 tablet by mouth daily.    . Multiple Vitamins-Minerals (PRESERVISION AREDS 2+MULTI VIT) CAPS Take 2 capsules by mouth daily.    . pioglitazone (ACTOS) 30 MG tablet Take 1 tablet (30 mg total) by mouth daily. 90 tablet 3  . simvastatin (ZOCOR) 10 MG tablet TAKE 1 TABLET BY MOUTH EVERY DAY 90 tablet 1  . sitaGLIPtin (JANUVIA) 50 MG tablet Take 1 tablet (50 mg total) by mouth daily. 90 tablet 3  . UNABLE TO FIND nitrofurantoin monohydrate/macrocrystals 100 mg capsule    . Vitamin D, Ergocalciferol, (DRISDOL) 1.25 MG (50000 UNIT) CAPS capsule Take 1 capsule (50,000 Units total) by mouth every 7 (seven) days. 12 capsule 0  . LORazepam (ATIVAN) 0.5 MG tablet Take 1 tablet (0.5 mg total) by mouth 3 (three) times daily as needed for anxiety. 90 tablet 1   No current facility-administered medications for this visit.    Allergies: Morphine and related and Penicillins  Past Medical History:  Diagnosis Date  . Cancer ([place])    breast  . Diabetes mellitus without complication ([place])   . High blood cholesterol   . Hypertension     Past Surgical History:  Procedure Laterality Date  . ABDOMINAL HYSTERECTOMY    . JOINT REPLACEMENT    . MASTECTOMY    . ORIF ELBOW FRACTURE Left 07/01/2017   Procedure: OPEN REDUCTION INTERNAL FIXATION (ORIF) ELBOW/OLECRANON FRACTURE;  Surgeon:  Haddix, Thomasene Lot, MD;  Location: [facility];  Service: Orthopedics;  Laterality: Left;  . WRIST SURGERY      Family History  Problem Relation Age of Onset  . Obesity Grandchild     Social History   Tobacco Use  . Smoking status: Never Smoker  . Smokeless tobacco: Never Used  Substance Use Topics  . Alcohol use: No    Subjective:   Accompanied by daughter; 1 month follow-up to check on weight/ response to stopping Glipizide at last OV; unfortunately has not done well- very anxious about her blood sugars being uncontrolled which has made her not want to eat and blood sugars have gone up without the  medication.   Objective:  Vitals:   10/13/20 1049  BP: 110/60  Pulse: 71  Temp: 97.6 F (36.4 C)  TempSrc: Oral  SpO2: 99%  Weight: 117 lb 12.8 oz (53.4 kg)  Height: $Remove'5\' 6"'JywaLFv$  (1.676 m)    General: Well developed, well nourished, in no acute distress  Head: Normocephalic and atraumatic  Lungs: Respirations unlabored;  Neurologic: Alert and oriented; speech intact; face symmetrical; moves all extremities well; CNII-XII intact without focal deficit   Assessment:  1. Anxiety   2. Vitamin D deficiency   3. Osteoporosis, unspecified osteoporosis type, unspecified pathological fracture presence   4. Type 2 diabetes mellitus without complication, without long-term current use of insulin ([place])     Plan:  1. Change Rx for Ativan- patient has thought that she had to take the medication every morning; she understands it is only as needed so if her anxiety is not bothering her in the day, she does not have to take it; will continue to use at night to help her rest; she understands she is not to take any medication in the morning unless it is with her breakfast. 2. & 3. Prolia given; 4. Re-start Glipizide; family will plan to update response in 2 weeks;   Time spent 30+ minutes;  This visit occurred during the SARS-CoV-2 public health emergency.  Safety protocols were in place, including screening questions prior to the visit, additional usage of staff PPE, and extensive cleaning of exam room while observing appropriate contact time as indicated for disinfecting solutions.      No follow-ups on file.  No orders of the defined types were placed in this encounter.   Requested Prescriptions   Signed Prescriptions Disp Refills  . LORazepam (ATIVAN) 0.5 MG tablet 90 tablet 1    Sig: Take 1 tablet (0.5 mg total) by mouth 3 (three) times daily as needed for anxiety.

## 2020-10-13 NOTE — Telephone Encounter (Signed)
[facility]Utilization [employer] Phone: [phone] Code: [phone] Authorized: [id number] Dates: 4.11.2022 thru 4.10.2023 2 visits authorized  Copay: $65 (5% of [phone] & 5% administration code)

## 2020-10-21 ENCOUNTER — Ambulatory Visit: Payer: Medicare Other | Admitting: Family

## 2020-11-05 ENCOUNTER — Other Ambulatory Visit: Payer: Self-pay | Admitting: Family

## 2020-12-31 ENCOUNTER — Other Ambulatory Visit: Payer: Self-pay | Admitting: Family

## 2021-02-13 ENCOUNTER — Other Ambulatory Visit: Payer: Self-pay | Admitting: Family

## 2021-02-25 ENCOUNTER — Telehealth: Payer: Self-pay | Admitting: Family

## 2021-02-25 DIAGNOSIS — I10 Essential (primary) hypertension: Secondary | ICD-10-CM

## 2021-02-27 ENCOUNTER — Ambulatory Visit: Payer: Medicare Other | Admitting: Family

## 2021-02-27 MED ORDER — LISINOPRIL-HYDROCHLOROTHIAZIDE 20-12.5 MG PO TABS: ORAL_TABLET | ORAL | 3 refills | Status: AC

## 2021-02-27 NOTE — Telephone Encounter (Signed)
Rx has been filled 

## 2021-03-10 ENCOUNTER — Ambulatory Visit (INDEPENDENT_AMBULATORY_CARE_PROVIDER_SITE_OTHER): Payer: Medicare Other

## 2021-03-10 ENCOUNTER — Other Ambulatory Visit: Payer: Self-pay

## 2021-03-10 ENCOUNTER — Ambulatory Visit (INDEPENDENT_AMBULATORY_CARE_PROVIDER_SITE_OTHER): Payer: Medicare Other | Admitting: Family

## 2021-03-10 VITALS — BP 142/70 | HR 68 | Temp 97.5°F | Ht 66.0 in | Wt 122.2 lb

## 2021-03-10 DIAGNOSIS — E538 Deficiency of other specified B group vitamins: Secondary | ICD-10-CM

## 2021-03-10 DIAGNOSIS — Z13220 Encounter for screening for lipoid disorders: Secondary | ICD-10-CM

## 2021-03-10 DIAGNOSIS — Z0000 Encounter for general adult medical examination without abnormal findings: Secondary | ICD-10-CM

## 2021-03-10 DIAGNOSIS — R634 Abnormal weight loss: Secondary | ICD-10-CM

## 2021-03-10 DIAGNOSIS — M810 Age-related osteoporosis without current pathological fracture: Secondary | ICD-10-CM | POA: Diagnosis not present

## 2021-03-10 DIAGNOSIS — E119 Type 2 diabetes mellitus without complications: Secondary | ICD-10-CM | POA: Diagnosis not present

## 2021-03-10 LAB — HEMOGLOBIN A1C: Hgb A1c MFr Bld: 7.8 % — ABNORMAL HIGH (ref 4.6–6.5)

## 2021-03-10 LAB — COMPREHENSIVE METABOLIC PANEL
ALT: 8 U/L (ref 0–35)
AST: 14 U/L (ref 0–37)
Albumin: 4.2 g/dL (ref 3.5–5.2)
Alkaline Phosphatase: 42 U/L (ref 39–117)
BUN: 28 mg/dL — ABNORMAL HIGH (ref 6–23)
CO2: 31 mEq/L (ref 19–32)
Calcium: 9.4 mg/dL (ref 8.4–10.5)
Chloride: 100 mEq/L (ref 96–112)
Creatinine, Ser: 0.85 mg/dL (ref 0.40–1.20)
GFR: 63.33 mL/min (ref >60.00)
Glucose, Bld: 131 mg/dL — ABNORMAL HIGH (ref 70–99)
Potassium: 5 mEq/L (ref 3.5–5.1)
Sodium: 138 mEq/L (ref 135–145)
Total Bilirubin: 0.4 mg/dL (ref 0.2–1.2)
Total Protein: 6.8 g/dL (ref 6.0–8.3)

## 2021-03-10 LAB — LIPID PANEL
Cholesterol: 141 mg/dL (ref 0–200)
HDL: 75.4 mg/dL (ref >39.00)
LDL Cholesterol: 52 mg/dL (ref 0–99)
NonHDL: 65.4
Total CHOL/HDL Ratio: 2
Triglycerides: 68 mg/dL (ref 0.0–149.0)
VLDL: 13.6 mg/dL (ref 0.0–40.0)

## 2021-03-10 LAB — CBC WITH DIFFERENTIAL/PLATELET
Basophils Absolute: 0 10*3/uL (ref 0.0–0.1)
Basophils Relative: 0.5 % (ref 0.0–3.0)
Eosinophils Absolute: 0 10*3/uL (ref 0.0–0.7)
Eosinophils Relative: 0.5 % (ref 0.0–5.0)
HCT: 36.8 % (ref 36.0–46.0)
Hemoglobin: 11.8 g/dL — ABNORMAL LOW (ref 12.0–15.0)
Lymphocytes Relative: 7.3 % — ABNORMAL LOW (ref 12.0–46.0)
Lymphs Abs: 0.5 10*3/uL — ABNORMAL LOW (ref 0.7–4.0)
MCHC: 31.9 g/dL (ref 30.0–36.0)
MCV: 91.8 fl (ref 78.0–100.0)
Monocytes Absolute: 0.4 10*3/uL (ref 0.1–1.0)
Monocytes Relative: 6.1 % (ref 3.0–12.0)
Neutro Abs: 6.1 10*3/uL (ref 1.4–7.7)
Neutrophils Relative %: 85.6 % — ABNORMAL HIGH (ref 43.0–77.0)
Platelets: 174 10*3/uL (ref 150.0–400.0)
RBC: 4.01 Mil/uL (ref 3.87–5.11)
RDW: 13.4 % (ref 11.5–15.5)
WBC: 7.1 10*3/uL (ref 4.0–10.5)

## 2021-03-10 LAB — VITAMIN B12: Vitamin B-12: 1254 pg/mL — ABNORMAL HIGH (ref 211–911)

## 2021-03-10 LAB — TSH: TSH: 1.31 u[IU]/mL (ref 0.35–5.50)

## 2021-03-10 MED ORDER — DENOSUMAB 60 MG/ML ~~LOC~~ SOSY
60.0000 mg | PREFILLED_SYRINGE | Freq: Once | SUBCUTANEOUS | Status: AC
Start: 2021-03-10 — End: 2021-03-10
  Administered 2021-03-10: 60 mg via SUBCUTANEOUS

## 2021-03-10 NOTE — Patient Instructions (Signed)
Please start drinking an extra Glucerna in the morning as we discussed ( around 11 am); Please find out the date of her last DEXA- ? Done in [place]?

## 2021-03-10 NOTE — Progress Notes (Signed)
Melanie Hughes is a 85 y.o. female with the following history as recorded in [employer]:  Patient Active Problem List   Diagnosis Date Noted   Parkinson's disease ([place]) 02/26/2019   Concussion 11/22/2018   Contusion of face 11/22/2018   Chondroma 10/14/2017   Normocytic anemia 06/29/2017   Hx of breast cancer 06/29/2017   Hypomagnesemia 06/29/2017   Hyperlipidemia 06/29/2017   Diabetes mellitus without complication ([place]) 67/89/3810   Hypertension 06/28/2017   Closed left hip fracture, initial encounter ([place]) 06/28/2017   Closed fracture of left olecranon process 06/28/2017   Anxiety 06/28/2017   Closed displaced fracture of greater trochanter of left femur ([facility])     Current Outpatient Medications  Medication Sig Dispense Refill   aspirin EC 81 MG tablet Take 81 mg by mouth daily.     bisacodyl (DULCOLAX) 5 MG EC tablet Take 1 tablet (5 mg total) by mouth daily as needed for moderate constipation. 30 tablet 0   blood glucose meter kit and supplies KIT Use up to 4x daily as directed---diagnosis code E11.9 1 each 0   Calcium Carb-Cholecalciferol (CALCIUM 500+D PO) Calcium 500 + D     carbidopa-levodopa (SINEMET IR) 25-100 MG tablet Take 1 tablet by mouth 3 (three) times daily.     CONTOUR NEXT TEST test strip   1   [facility] COQ-10 200 MG capsule TAKE 1 CAPSULE BY MOUTH EVERY DAY 90 capsule 0   escitalopram (LEXAPRO) 20 MG tablet TAKE 1 TABLET BY MOUTH EVERYDAY AT BEDTIME 90 tablet 3   glipiZIDE (GLUCOTROL) 5 MG tablet Take 1 tablet (5 mg total) by mouth daily before breakfast. 90 tablet 3   lisinopril-hydrochlorothiazide (ZESTORETIC) 20-12.5 MG tablet TAKE 1 TABLET BY MOUTH EVERY DAY IN THE MORNING 90 tablet 3   LITE TOUCH LANCETS MISC   1   LORazepam (ATIVAN) 0.5 MG tablet TAKE 1 TABLET BY MOUTH EVERY 8 HOURS AS NEEDED FOR ANXIETY. 90 tablet 0   metFORMIN (GLUCOPHAGE) 1000 MG tablet TAKE 1 TABLET BY MOUTH TWICE A DAY 180 tablet 1   Multiple Vitamin (MULTIVITAMIN WITH MINERALS) TABS tablet  Take 1 tablet by mouth daily.     pioglitazone (ACTOS) 30 MG tablet Take 1 tablet (30 mg total) by mouth daily. 90 tablet 3   simvastatin (ZOCOR) 10 MG tablet TAKE 1 TABLET BY MOUTH EVERY DAY 90 tablet 1   sitaGLIPtin (JANUVIA) 50 MG tablet Take 1 tablet (50 mg total) by mouth daily. 90 tablet 3   fluticasone (FLONASE) 50 MCG/ACT nasal spray Place 2 sprays into both nostrils daily. (Patient not taking: No sig reported) 16 g 11   Multiple Vitamins-Minerals (PRESERVISION AREDS 2+MULTI VIT) CAPS Take 2 capsules by mouth daily.     UNABLE TO FIND nitrofurantoin monohydrate/macrocrystals 100 mg capsule (Patient not taking: No sig reported)     Vitamin D, Ergocalciferol, (DRISDOL) 1.25 MG (50000 UNIT) CAPS capsule Take 1 capsule (50,000 Units total) by mouth every 7 (seven) days. (Patient not taking: No sig reported) 12 capsule 0   No current facility-administered medications for this visit.    Allergies: Morphine and related and Penicillins  Past Medical History:  Diagnosis Date   Cancer ([place])    breast   Diabetes mellitus without complication ([place])    High blood cholesterol    Hypertension     Past Surgical History:  Procedure Laterality Date   ABDOMINAL HYSTERECTOMY     JOINT REPLACEMENT     MASTECTOMY     ORIF ELBOW FRACTURE  Left 07/01/2017   Procedure: OPEN REDUCTION INTERNAL FIXATION (ORIF) ELBOW/OLECRANON FRACTURE;  Surgeon: Shona Needles, MD;  Location: [facility];  Service: Orthopedics;  Laterality: Left;   WRIST SURGERY      Family History  Problem Relation Age of Onset   Obesity Grandchild     Social History   Tobacco Use   Smoking status: Never   Smokeless tobacco: Never  Substance Use Topics   Alcohol use: No    Subjective:  Patient presents for yearly CPE; is accompanied by her daughter today; Mammogram is scheduled for next week;  History of osteoporosis- thinks DEXA done in [place]- getting Prolia shots regularly;  Has been able to cut her Ativan back to bid;   Has lost 7 pounds since last OV in April- feels like she is eating well; denies any night sweats, nausea, vomiting;    Review of Systems  Constitutional:  Positive for weight loss.  HENT:  Positive for hearing loss.   Eyes: Negative.   Respiratory: Negative.    Cardiovascular: Negative.   Gastrointestinal: Negative.   Genitourinary: Negative.   Musculoskeletal: Negative.   Skin: Negative.   Neurological: Negative.   Endo/Heme/Allergies: Negative.   Psychiatric/Behavioral: Negative.       Objective:  Vitals:   03/10/21 1105  BP: (!) 142/70  Pulse: 68  Temp: (!) 97.5 F (36.4 C)  TempSrc: Oral  SpO2: 99%  Weight: 122 lb 3.2 oz (55.4 kg)  Height: $Remove'5\' 6"'[id number]$  (1.676 m)    General: Well developed, well nourished, in no acute distress  Skin : Warm and dry.  Head: Normocephalic and atraumatic  Eyes: Sclera and conjunctiva clear; pupils round and reactive to light; extraocular movements intact  Ears: External normal; canals clear; tympanic membranes normal  Oropharynx: Pink, supple. No suspicious lesions  Neck: Supple without thyromegaly, adenopathy  Lungs: Respirations unlabored; clear to auscultation bilaterally without wheeze, rales, rhonchi  CVS exam: normal rate and regular rhythm.  Abdomen: Soft; nontender; nondistended; normoactive bowel sounds; no masses or hepatosplenomegaly  Musculoskeletal: No deformities; no active joint inflammation  Extremities: No edema, cyanosis, clubbing  Vessels: Symmetric bilaterally  Neurologic: Alert and oriented; speech intact; face symmetrical; moves all extremities well; CNII-XII intact without focal deficit  Assessment:  1. PE (physical exam), annual   2. Lipid screening   3. Type 2 diabetes mellitus without complication, without long-term current use of insulin ([place])   4. B12 deficiency   5. Weight loss   6. Osteoporosis, unspecified osteoporosis type, unspecified pathological fracture presence     Plan:  Age appropriate preventive  healthcare needs addressed; encouraged regular eye doctor and dental exams; encouraged regular exercise; will update labs and refills as needed today; follow-up to be determined; Order for DEXA updated- to be done in [place], [place] at same time as upcoming mammogram; She is encouraged to starting drinking extra Glucerna; to consider CXR and abdominal imaging to rule out concerning source for weight loss; Encouraged to get hearing aids- per daughter, they will see ENT in [place], [place];  This visit occurred during the SARS-CoV-2 public health emergency.  Safety protocols were in place, including screening questions prior to the visit, additional usage of staff PPE, and extensive cleaning of exam room while observing appropriate contact time as indicated for disinfecting solutions.    No follow-ups on file.  Orders Placed This Encounter  Procedures   DG Bone Density    Standing Status:   Future    Standing Expiration Date:   03/10/2022  Scheduling Instructions:     [place]    Order Specific Question:   Reason for Exam (SYMPTOM  OR DIAGNOSIS REQUIRED)    Answer:   osteoporosis    Order Specific Question:   Preferred imaging location?    Answer:   External   CBC with Differential/Platelet   Comp Met (CMET)   Lipid panel   Hemoglobin A1c   TSH   B12    Requested Prescriptions    No prescriptions requested or ordered in this encounter

## 2021-03-10 NOTE — Progress Notes (Signed)
Pt got the Prolia injection on right arm SubQ and tolerated well.

## 2021-03-10 NOTE — Patient Instructions (Signed)
Melanie Hughes , Thank you for taking time to come for your Medicare Wellness Visit. I appreciate your ongoing commitment to your health goals. Please review the following plan we discussed and let me know if I can assist you in the future.   Screening recommendations/referrals: Colonoscopy: No longer required Mammogram: Scheduled for this month Bone Density: PCP ordered today Recommended yearly ophthalmology/optometry visit for glaucoma screening and checkup Recommended yearly dental visit for hygiene and checkup  Vaccinations: Influenza vaccine: Due-May obtain vaccine at our office or your local pharmacy Pneumococcal vaccine: Declined Tdap vaccine: Up to date-Due-07/2023 Shingles vaccine: Discuss with pharmacy   Covid-19:Booster due  Advanced directives: Please bring a copy for your chart  Conditions/risks identified: See problem list  Next appointment: Follow up in one year for your annual wellness visit    Preventive Care 65 Years and Older, Female Preventive care refers to lifestyle choices and visits with your health care provider that can promote health and wellness. What does preventive care include? A yearly physical exam. This is also called an annual well check. Dental exams once or twice a year. Routine eye exams. Ask your health care provider how often you should have your eyes checked. Personal lifestyle choices, including: Daily care of your teeth and gums. Regular physical activity. Eating a healthy diet. Avoiding tobacco and drug use. Limiting alcohol use. Practicing safe sex. Taking low-dose aspirin every day. Taking vitamin and mineral supplements as recommended by your health care provider. What happens during an annual well check? The services and screenings done by your health care provider during your annual well check will depend on your age, overall health, lifestyle risk factors, and family history of disease. Counseling  Your health care provider may  ask you questions about your: Alcohol use. Tobacco use. Drug use. Emotional well-being. Home and relationship well-being. Sexual activity. Eating habits. History of falls. Memory and ability to understand (cognition). Work and work Statistician. Reproductive health. Screening  You may have the following tests or measurements: Height, weight, and BMI. Blood pressure. Lipid and cholesterol levels. These may be checked every 5 years, or more frequently if you are over 4 years old. Skin check. Lung cancer screening. You may have this screening every year starting at age 6 if you have a 30-pack-year history of smoking and currently smoke or have quit within the past 15 years. Fecal occult blood test (FOBT) of the stool. You may have this test every year starting at age 58. Flexible sigmoidoscopy or colonoscopy. You may have a sigmoidoscopy every 5 years or a colonoscopy every 10 years starting at age 42. Hepatitis C blood test. Hepatitis B blood test. Sexually transmitted disease (STD) testing. Diabetes screening. This is done by checking your blood sugar (glucose) after you have not eaten for a while (fasting). You may have this done every 1-3 years. Bone density scan. This is done to screen for osteoporosis. You may have this done starting at age 42. Mammogram. This may be done every 1-2 years. Talk to your health care provider about how often you should have regular mammograms. Talk with your health care provider about your test results, treatment options, and if necessary, the need for more tests. Vaccines  Your health care provider may recommend certain vaccines, such as: Influenza vaccine. This is recommended every year. Tetanus, diphtheria, and acellular pertussis (Tdap, Td) vaccine. You may need a Td booster every 10 years. Zoster vaccine. You may need this after age 48. Pneumococcal 13-valent conjugate (PCV13) vaccine. One  dose is recommended after age 22. Pneumococcal  polysaccharide (PPSV23) vaccine. One dose is recommended after age 42. Talk to your health care provider about which screenings and vaccines you need and how often you need them. This information is not intended to replace advice given to you by your health care provider. Make sure you discuss any questions you have with your health care provider. Document Released: 07/18/2015 Document Revised: 03/10/2016 Document Reviewed: 04/22/2015 Elsevier Interactive Patient Education  2017 [employer] Prevention in the Home Falls can cause injuries. They can happen to people of all ages. There are many things you can do to make your home safe and to help prevent falls. What can I do on the outside of my home? Regularly fix the edges of walkways and driveways and fix any cracks. Remove anything that might make you trip as you walk through a door, such as a raised step or threshold. Trim any bushes or trees on the path to your home. Use bright outdoor lighting. Clear any walking paths of anything that might make someone trip, such as rocks or tools. Regularly check to see if handrails are loose or broken. Make sure that both sides of any steps have handrails. Any raised decks and porches should have guardrails on the edges. Have any leaves, snow, or ice cleared regularly. Use sand or salt on walking paths during winter. Clean up any spills in your garage right away. This includes oil or grease spills. What can I do in the bathroom? Use night lights. Install grab bars by the toilet and in the tub and shower. Do not use towel bars as grab bars. Use non-skid mats or decals in the tub or shower. If you need to sit down in the shower, use a plastic, non-slip stool. Keep the floor dry. Clean up any water that spills on the floor as soon as it happens. Remove soap buildup in the tub or shower regularly. Attach bath mats securely with double-sided non-slip rug tape. Do not have throw rugs and other  things on the floor that can make you trip. What can I do in the bedroom? Use night lights. Make sure that you have a light by your bed that is easy to reach. Do not use any sheets or blankets that are too big for your bed. They should not hang down onto the floor. Have a firm chair that has side arms. You can use this for support while you get dressed. Do not have throw rugs and other things on the floor that can make you trip. What can I do in the kitchen? Clean up any spills right away. Avoid walking on wet floors. Keep items that you use a lot in easy-to-reach places. If you need to reach something above you, use a strong step stool that has a grab bar. Keep electrical cords out of the way. Do not use floor polish or wax that makes floors slippery. If you must use wax, use non-skid floor wax. Do not have throw rugs and other things on the floor that can make you trip. What can I do with my stairs? Do not leave any items on the stairs. Make sure that there are handrails on both sides of the stairs and use them. Fix handrails that are broken or loose. Make sure that handrails are as long as the stairways. Check any carpeting to make sure that it is firmly attached to the stairs. Fix any carpet that is loose or worn.  Avoid having throw rugs at the top or bottom of the stairs. If you do have throw rugs, attach them to the floor with carpet tape. Make sure that you have a light switch at the top of the stairs and the bottom of the stairs. If you do not have them, ask someone to add them for you. What else can I do to help prevent falls? Wear shoes that: Do not have high heels. Have rubber bottoms. Are comfortable and fit you well. Are closed at the toe. Do not wear sandals. If you use a stepladder: Make sure that it is fully opened. Do not climb a closed stepladder. Make sure that both sides of the stepladder are locked into place. Ask someone to hold it for you, if possible. Clearly  mark and make sure that you can see: Any grab bars or handrails. First and last steps. Where the edge of each step is. Use tools that help you move around (mobility aids) if they are needed. These include: Canes. Walkers. Scooters. Crutches. Turn on the lights when you go into a dark area. Replace any light bulbs as soon as they burn out. Set up your furniture so you have a clear path. Avoid moving your furniture around. If any of your floors are uneven, fix them. If there are any pets around you, be aware of where they are. Review your medicines with your doctor. Some medicines can make you feel dizzy. This can increase your chance of falling. Ask your doctor what other things that you can do to help prevent falls. This information is not intended to replace advice given to you by your health care provider. Make sure you discuss any questions you have with your health care provider. Document Released: 04/17/2009 Document Revised: 11/27/2015 Document Reviewed: 07/26/2014 Elsevier Interactive Patient Education  2017 [employer].

## 2021-03-10 NOTE — Progress Notes (Addendum)
Subjective:   Melanie Hughes is a 85 y.o. female who presents for Medicare Annual (Subsequent) preventive examination.  Review of Systems     Cardiac Risk Factors include: advanced age (>93mn, >>64women);diabetes mellitus;dyslipidemia;hypertension     Objective:    Today's Vitals   03/10/21 1245  BP: (!) 142/70  Pulse: 68  Temp: (!) 97.5 F (36.4 C)  SpO2: 98%  Weight: 122 lb 3.2 oz (55.4 kg)  Height: _0  (1.676 m)   Body mass index is 19.72 kg/m.  Advanced Directives 03/10/2021 02/29/2020 02/26/2019 02/16/2018 06/28/2017  Does Patient Have a Medical Advance Directive? Yes Yes No Yes Yes  Type of [place]amed[place] Living will - [place] Does patient want to make changes to medical advance directive? - No - Patient declined - - No - Patient declined  Copy of [place] Chart? No - copy requested - - No - copy requested No - copy requested  Would patient like information on creating a medical advance directive? - - Yes (ED - Information included in AVS) - -    Current Medications (verified) Outpatient Encounter Medications as of 03/10/2021  Medication Sig   aspirin EC 81 MG tablet Take 81 mg by mouth daily.   bisacodyl (DULCOLAX) 5 MG EC tablet Take 1 tablet (5 mg total) by mouth daily as needed for moderate constipation.   blood glucose meter kit and supplies KIT Use up to 4x daily as directed---diagnosis code E11.9   Calcium Carb-Cholecalciferol (CALCIUM 500+D PO) Calcium 500 + D   carbidopa-levodopa (SINEMET IR) 25-100 MG tablet Take 1 tablet by mouth 3 (three) times daily.   CONTOUR NEXT TEST test strip    [facility] COQ-10 200 MG capsule TAKE 1 CAPSULE BY MOUTH EVERY DAY   escitalopram (LEXAPRO) 20 MG tablet TAKE 1 TABLET BY MOUTH EVERYDAY AT BEDTIME   glipiZIDE (GLUCOTROL) 5 MG tablet Take 1 tablet (5 mg total) by mouth daily before breakfast.    lisinopril-hydrochlorothiazide (ZESTORETIC) 20-12.5 MG tablet TAKE 1 TABLET BY MOUTH EVERY DAY IN THE MORNING   LITE TOUCH LANCETS MISC    LORazepam (ATIVAN) 0.5 MG tablet TAKE 1 TABLET BY MOUTH EVERY 8 HOURS AS NEEDED FOR ANXIETY.   metFORMIN (GLUCOPHAGE) 1000 MG tablet TAKE 1 TABLET BY MOUTH TWICE A DAY   Multiple Vitamin (MULTIVITAMIN WITH MINERALS) TABS tablet Take 1 tablet by mouth daily.   Multiple Vitamins-Minerals (PRESERVISION AREDS 2+MULTI VIT) CAPS Take 2 capsules by mouth daily.   pioglitazone (ACTOS) 30 MG tablet Take 1 tablet (30 mg total) by mouth daily.   simvastatin (ZOCOR) 10 MG tablet TAKE 1 TABLET BY MOUTH EVERY DAY   sitaGLIPtin (JANUVIA) 50 MG tablet Take 1 tablet (50 mg total) by mouth daily.   fluticasone (FLONASE) 50 MCG/ACT nasal spray Place 2 sprays into both nostrils daily. (Patient not taking: No sig reported)   UNABLE TO FIND nitrofurantoin monohydrate/macrocrystals 100 mg capsule (Patient not taking: No sig reported)   Vitamin D, Ergocalciferol, (DRISDOL) 1.25 MG (50000 UNIT) CAPS capsule Take 1 capsule (50,000 Units total) by mouth every 7 (seven) days. (Patient not taking: No sig reported)   No facility-administered encounter medications on file as of 03/10/2021.    Allergies (verified) Morphine and related and Penicillins   History: Past Medical History:  Diagnosis Date   Cancer ([place]    breast   Diabetes mellitus without complication ([place]  High blood cholesterol    Hypertension    Past Surgical History:  Procedure Laterality Date   ABDOMINAL HYSTERECTOMY     JOINT REPLACEMENT     MASTECTOMY     ORIF ELBOW FRACTURE Left 07/01/2017   Procedure: OPEN REDUCTION INTERNAL FIXATION (ORIF) ELBOW/OLECRANON FRACTURE;  Surgeon: Shona Needles, MD;  Location: [facility];  Service: Orthopedics;  Laterality: Left;   WRIST SURGERY     Family History  Problem Relation Age of Onset   Obesity Grandchild    Social History   Socioeconomic History   Marital  status: Widowed    Spouse name: Not on file   Number of children: 3   Years of education: Not on file   Highest education level: Not on file  Occupational History   Occupation: retired  Tobacco Use   Smoking status: Never   Smokeless tobacco: Never  Vaping Use   Vaping Use: Never used  Substance and Sexual Activity   Alcohol use: No   Drug use: No   Sexual activity: Never  Other Topics Concern   Not on file  Social History Narrative   Not on file   Social Determinants of Health   Financial Resource Strain: Low Risk    Difficulty of Paying Living Expenses: Not hard at all  Food Insecurity: No Food Insecurity   Worried About Charity fundraiser in the Last Year: Never true   [place] in the Last Year: Never true  Transportation Needs: No Transportation Needs   Lack of Transportation (Medical): No   Lack of Transportation (Non-Medical): No  Physical Activity: Inactive   Days of Exercise per Week: 0 days   Minutes of Exercise per Session: 0 min  Stress: No Stress Concern Present   Feeling of Stress : Not at all  Social Connections: Moderately Isolated   Frequency of Communication with Friends and Family: More than three times a week   Frequency of Social Gatherings with Friends and Family: More than three times a week   Attends Religious Services: More than 4 times per year   Active Member of [employer] or Organizations: No   Attends Archivist Meetings: Never   Marital Status: Widowed    Tobacco Counseling Counseling given: Not Answered   Clinical Intake:  Pre-visit preparation completed: Yes  Pain : No/denies pain     Nutritional Risks: None Diabetes: Yes CBG done?: No Did pt. bring in CBG monitor from home?: No  How often do you need to have someone help you when you read instructions, pamphlets, or other written materials from your doctor or pharmacy?: 1 - Never  Diabetes:  Is the patient diabetic?  Yes  If diabetic, was a CBG obtained  today?  No  Did the patient bring in their glucometer from home?  No  How often do you monitor your CBG's? Twice daily.   Financial Strains and Diabetes Management:  Are you having any financial strains with the device, your supplies or your medication? No .  Does the patient want to be seen by Chronic Care Management for management of their diabetes?  No  Would the patient like to be referred to a Nutritionist or for Diabetic Management?  No   Diabetic Exams:  Diabetic Eye Exam: Completed 2021. Per patient  Diabetic Foot Exam:Pt has been advised about the importance in completing this exam. To be completed by PCP.Marland Kitchen    Interpreter Needed?: No  Information entered by :: Caroleen Hamman  LPN   Activities of Daily Living In your present state of health, do you have any difficulty performing the following activities: 03/10/2021  Hearing? N  Vision? N  Difficulty concentrating or making decisions? N  Walking or climbing stairs? N  Dressing or bathing? N  Doing errands, shopping? N  Preparing Food and eating ? N  Using the Toilet? N  In the past six months, have you accidently leaked urine? Y  Comment occasionally at night  Do you have problems with loss of bowel control? N  Managing your Medications? N  Managing your Finances? N  Housekeeping or managing your Housekeeping? N  Some recent data might be hidden    Patient Care Team: Marrian Salvage, [place] as PCP - General (Internal Medicine)  Indicate any recent Medical Services you may have received from other than [facility] providers in the past year (date may be approximate).     Assessment:   This is a routine wellness examination for Melanie Hughes.  Hearing/Vision screen Hearing Screening - Comments:: C/o hearing loss Vision Screening - Comments:: Wears glasses Last eye exam-2021-[facility]  Dietary issues and exercise activities discussed: Current Exercise Habits: The patient does not participate in regular exercise  at present, Exercise limited by: None identified   Goals Addressed             This Visit's Progress    Patient Stated       Continue drinking plenty of water & increase activity       Depression Screen PHQ 2/9 Scores 03/10/2021 03/10/2021 02/29/2020 02/29/2020 02/26/2019 02/16/2018  PHQ - 2 Score 1 0 0 0 1 1  PHQ- 9 Score - - - - - 3    Fall Risk Fall Risk  03/10/2021 03/10/2021 10/13/2020 02/29/2020 02/26/2019  Falls in the past year? 0 0 0 1 1  Number falls in past yr: 0 0 0 0 1  Injury with Fall? 0 0 0 0 1  Risk Factor Category  - - - - -  Risk for fall due to : - No Fall Risks No Fall Risks No Fall Risks Impaired mobility;Impaired balance/gait  Follow up Falls prevention discussed Falls evaluation completed Falls evaluation completed Falls evaluation completed Falls prevention discussed;Education provided    FALL RISK PREVENTION PERTAINING TO THE HOME:  Any stairs in or around the home? Yes  If so, are there any without handrails? No  Home free of loose throw rugs in walkways, pet beds, electrical cords, etc? Yes  Adequate lighting in your home to reduce risk of falls? Yes   ASSISTIVE DEVICES UTILIZED TO PREVENT FALLS:  Life alert? Yes  Use of a cane, walker or w/c? No  Grab bars in the bathroom? No  Shower chair or bench in shower? Yes  Elevated toilet seat or a handicapped toilet? No   TIMED UP AND GO:  Was the test performed? Yes .  Length of time to ambulate 10 feet: 11 sec.   Gait slow and steady without use of assistive device  Cognitive Function: MMSE - Mini Mental State Exam 02/16/2018  Orientation to time 5  Orientation to Place 5  Registration 3  Attention/ Calculation 5  Recall 3  Language- name 2 objects 2  Language- repeat 1  Language- follow 3 step command 3  Language- read & follow direction 1  Write a sentence 1  Copy design 1  Total score 30     6CIT Screen 02/26/2019  What Year? 0 points  What month? 0 points  What time? 0 points  Count  back from 20 0 points  Months in reverse 2 points  Repeat phrase 4 points  Total Score 6    Immunizations Immunization History  Administered Date(s) Administered   Influenza,inj,Quad PF,6+ Mos 05/02/2020   Influenza,inj,quad, With Preservative 04/08/2015   Influenza-Unspecified 04/06/2016, 04/13/2018   PFIZER(Purple Top)SARS-COV-2 Vaccination 08/13/2019, 09/08/2019, 04/17/2020   Tdap 07/05/2013   Zoster, Live 07/05/2013    TDAP status: Up to date  Flu Vaccine status: Due, Education has been provided regarding the importance of this vaccine. Advised may receive this vaccine at local pharmacy or Health Dept. Aware to provide a copy of the vaccination record if obtained from local pharmacy or Health Dept. Verbalized acceptance and understanding.  Pneumococcal vaccine status: Declined,  Education has been provided regarding the importance of this vaccine but patient still declined. Advised may receive this vaccine at local pharmacy or Health Dept. Aware to provide a copy of the vaccination record if obtained from local pharmacy or Health Dept. Verbalized acceptance and understanding.   Covid-19 vaccine status: Information provided on how to obtain vaccines. Booster due  Qualifies for Shingles Vaccine? Yes   Zostavax completed Yes   Shingrix Completed?: No.    Education has been provided regarding the importance of this vaccine. Patient has been advised to call insurance company to determine out of pocket expense if they have not yet received this vaccine. Advised may also receive vaccine at local pharmacy or Health Dept. Verbalized acceptance and understanding.  Screening Tests Health Maintenance  Topic Date Due   Zoster Vaccines- Shingrix (1 of 2) Never done   PNA vac Low Risk Adult (1 of 2 - PCV13) Never done   FOOT EXAM  02/26/2020   OPHTHALMOLOGY EXAM  04/18/2020   COVID-19 Vaccine (4 - Booster for Pfizer series) 07/18/2020   INFLUENZA VACCINE  02/02/2021   HEMOGLOBIN A1C   03/13/2021   TETANUS/TDAP  07/06/2023   DEXA SCAN  Completed   HPV VACCINES  Aged Out    Health Maintenance  Health Maintenance Due  Topic Date Due   Zoster Vaccines- Shingrix (1 of 2) Never done   PNA vac Low Risk Adult (1 of 2 - PCV13) Never done   FOOT EXAM  02/26/2020   OPHTHALMOLOGY EXAM  04/18/2020   COVID-19 Vaccine (4 - Booster for Pfizer series) 07/18/2020   INFLUENZA VACCINE  02/02/2021    Colorectal cancer screening: No longer required.   Mammogram status: Scheduled for this month.  Bone Density status: Ordered today by PCP. Pt provided with contact info and advised to call to schedule appt.  Lung Cancer Screening: (Low Dose CT Chest recommended if Age 45-80 years, 30 pack-year currently smoking OR have quit w/in 15years.) does not qualify.     Additional Screening:  Hepatitis C Screening: does not qualify  Vision Screening: Recommended annual ophthalmology exams for early detection of glaucoma and other disorders of the eye. Is the patient up to date with their annual eye exam?  Yes  Who is the provider or what is the name of the office in which the patient attends annual eye exams? [facility] in [place]: Recommended annual dental exams for proper oral hygiene  Community Resource Referral / Chronic Care Management: CRR required this visit?  No   CCM required this visit?  No      Plan:     I have personally reviewed and noted the following  in the patient's chart:   Medical and social history Use of alcohol, tobacco or illicit drugs  Current medications and supplements including opioid prescriptions.  Functional ability and status Nutritional status Physical activity Advanced directives List of other physicians Hospitalizations, surgeries, and ER visits in previous 12 months Vitals Screenings to include cognitive, depression, and falls Referrals and appointments  In addition, I have reviewed and discussed with  patient certain preventive protocols, quality metrics, and best practice recommendations. A written personalized care plan for preventive services as well as general preventive health recommendations were provided to patient.   Patient to access avs on mychart.  Marta Antu, LPN   11/05/2368  Nurse Health Advisor  Nurse Notes: None  Medical screening examination/treatment/procedure(s) were performed by non-physician practitioner and as supervising provider I was immediately available for consultation/collaboration.  I agree with above. Marrian Salvage, FNP

## 2021-03-20 ENCOUNTER — Encounter: Payer: Self-pay | Admitting: Family

## 2021-03-20 NOTE — Progress Notes (Signed)
Lab letter sent 

## 2021-04-01 ENCOUNTER — Encounter: Payer: Self-pay | Admitting: Family

## 2021-04-01 ENCOUNTER — Other Ambulatory Visit: Payer: Self-pay | Admitting: Family

## 2021-04-01 MED ORDER — LORAZEPAM 0.5 MG PO TABS: 0.5000 mg | ORAL_TABLET | Freq: Two times a day (BID) | ORAL | 0 refills | Status: DC

## 2021-04-07 ENCOUNTER — Telehealth: Payer: Self-pay | Admitting: Family

## 2021-04-07 NOTE — Telephone Encounter (Signed)
[facility] called to let us know that they do not do bone density scans and that we need to send it to [facility]. Please advice

## 2021-04-07 NOTE — Telephone Encounter (Signed)
Order has been faxed to [facility] at [phone].

## 2021-04-16 ENCOUNTER — Other Ambulatory Visit: Payer: Self-pay | Admitting: Family

## 2021-04-16 DIAGNOSIS — G20 Parkinson's disease: Secondary | ICD-10-CM

## 2021-05-08 ENCOUNTER — Encounter: Payer: Self-pay | Admitting: Family

## 2021-05-08 MED ORDER — LORAZEPAM 0.5 MG PO TABS: 0.5000 mg | ORAL_TABLET | Freq: Two times a day (BID) | ORAL | 0 refills | Status: DC

## 2021-05-08 NOTE — Telephone Encounter (Signed)
Rx request for controlled.   Is this okay to send over state lines?  Patient is requesting a refill of the following medications: Requested Prescriptions   Pending Prescriptions Disp Refills   LORazepam (ATIVAN) 0.5 MG tablet 60 tablet 0    Sig: Take 1 tablet (0.5 mg total) by mouth 2 (two) times daily. for anxiety    Date of patient request: 05/08/21 Last office visit: 03/10/21 Date of last refill: 04/01/21 Last refill amount: 60 Follow up time period per chart: none yet

## 2021-06-11 ENCOUNTER — Other Ambulatory Visit: Payer: Self-pay

## 2021-06-11 ENCOUNTER — Ambulatory Visit: Payer: Medicare Other | Admitting: Family

## 2021-06-11 VITALS — BP 118/64 | Temp 97.5°F | Resp 18 | Ht 66.0 in | Wt 121.0 lb

## 2021-06-11 DIAGNOSIS — R634 Abnormal weight loss: Secondary | ICD-10-CM

## 2021-06-11 DIAGNOSIS — F419 Anxiety disorder, unspecified: Secondary | ICD-10-CM

## 2021-06-11 DIAGNOSIS — R300 Dysuria: Secondary | ICD-10-CM | POA: Diagnosis not present

## 2021-06-11 DIAGNOSIS — R5383 Other fatigue: Secondary | ICD-10-CM | POA: Diagnosis not present

## 2021-06-11 DIAGNOSIS — E119 Type 2 diabetes mellitus without complications: Secondary | ICD-10-CM | POA: Diagnosis not present

## 2021-06-11 DIAGNOSIS — G20 Parkinson's disease: Secondary | ICD-10-CM

## 2021-06-11 MED ORDER — LORAZEPAM 0.5 MG PO TABS: 0.5000 mg | ORAL_TABLET | Freq: Two times a day (BID) | ORAL | 1 refills | Status: DC

## 2021-06-11 NOTE — Progress Notes (Signed)
Melanie Hughes is a 85 y.o. female with the following history as recorded in EpicCare:  Patient Active Problem List   Diagnosis Date Noted   Parkinson's disease ([place]) 02/26/2019   Concussion 11/22/2018   Contusion of face 11/22/2018   Chondroma 10/14/2017   Normocytic anemia 06/29/2017   Hx of breast cancer 06/29/2017   Hypomagnesemia 06/29/2017   Hyperlipidemia 06/29/2017   Diabetes mellitus without complication ([place]) 46/56/8127   Hypertension 06/28/2017   Closed left hip fracture, initial encounter ([place]) 06/28/2017   Closed fracture of left olecranon process 06/28/2017   Anxiety 06/28/2017   Closed displaced fracture of greater trochanter of left femur (HCC)     Current Outpatient Medications  Medication Sig Dispense Refill   aspirin EC 81 MG tablet Take 81 mg by mouth daily.     bisacodyl (DULCOLAX) 5 MG EC tablet Take 1 tablet (5 mg total) by mouth daily as needed for moderate constipation. 30 tablet 0   blood glucose meter kit and supplies KIT Use up to 4x daily as directed---diagnosis code E11.9 1 each 0   Calcium Carb-Cholecalciferol (CALCIUM 500+D PO) Calcium 500 + D     carbidopa-levodopa (SINEMET IR) 25-100 MG tablet Take 1 tablet by mouth 3 (three) times daily.     CONTOUR NEXT TEST test strip   1   CVS COQ-10 200 MG capsule TAKE 1 CAPSULE BY MOUTH EVERY DAY 90 capsule 0   escitalopram (LEXAPRO) 20 MG tablet TAKE 1 TABLET BY MOUTH EVERYDAY AT BEDTIME 90 tablet 3   fluticasone (FLONASE) 50 MCG/ACT nasal spray Place 2 sprays into both nostrils daily. 16 g 11   glipiZIDE (GLUCOTROL) 5 MG tablet Take 1 tablet (5 mg total) by mouth daily before breakfast. 90 tablet 3   lisinopril-hydrochlorothiazide (ZESTORETIC) 20-12.5 MG tablet TAKE 1 TABLET BY MOUTH EVERY DAY IN THE MORNING 90 tablet 3   LITE TOUCH LANCETS MISC   1   LORazepam (ATIVAN) 0.5 MG tablet Take 1 tablet (0.5 mg total) by mouth 2 (two) times daily. for anxiety 60 tablet 0   metFORMIN (GLUCOPHAGE) 1000 MG tablet  TAKE 1 TABLET BY MOUTH TWICE A DAY 180 tablet 1   Multiple Vitamin (MULTIVITAMIN WITH MINERALS) TABS tablet Take 1 tablet by mouth daily.     Multiple Vitamins-Minerals (PRESERVISION AREDS 2+MULTI VIT) CAPS Take 2 capsules by mouth daily.     pioglitazone (ACTOS) 30 MG tablet Take 1 tablet (30 mg total) by mouth daily. 90 tablet 3   simvastatin (ZOCOR) 10 MG tablet TAKE 1 TABLET BY MOUTH EVERY DAY 90 tablet 1   sitaGLIPtin (JANUVIA) 50 MG tablet Take 1 tablet (50 mg total) by mouth daily. 90 tablet 3   UNABLE TO FIND nitrofurantoin monohydrate/macrocrystals 100 mg capsule     Vitamin D, Ergocalciferol, (DRISDOL) 1.25 MG (50000 UNIT) CAPS capsule Take 1 capsule (50,000 Units total) by mouth every 7 (seven) days. 12 capsule 0   No current facility-administered medications for this visit.    Allergies: Morphine and related and Penicillins  Past Medical History:  Diagnosis Date   Cancer ([place])    breast   Diabetes mellitus without complication ([place])    High blood cholesterol    Hypertension     Past Surgical History:  Procedure Laterality Date   ABDOMINAL HYSTERECTOMY     JOINT REPLACEMENT     MASTECTOMY     ORIF ELBOW FRACTURE Left 07/01/2017   Procedure: OPEN REDUCTION INTERNAL FIXATION (ORIF) ELBOW/OLECRANON FRACTURE;  Surgeon: Katha Hamming  P, MD;  Location: [place];  Service: Orthopedics;  Laterality: Left;   WRIST SURGERY      Family History  Problem Relation Age of Onset   Obesity Grandchild     Social History   Tobacco Use   Smoking status: Never   Smokeless tobacco: Never  Substance Use Topics   Alcohol use: No    Subjective:  Accompanied by her 2 daughters; 3 month follow up weight check/ diabetes follow up; patient's weight is stable at this visit- averaging 122-121 pounds; patient brings blood sugar readings for review- her readings indicate fasting am sugars around 106-130 and pm sugars averaging 160-170; occasional readings in the 200s in the evenings; Daughter is  concerned for possible UTI;   Requesting refill on Ativan;   Family is interested in social work referral to look at options to keep patient in home safely;      Objective:  Vitals:   06/11/21 1325  BP: 118/64  Resp: 18  Temp: (!) 97.5 F (36.4 C)  TempSrc: Oral  Weight: 121 lb (54.9 kg)  Height: $Remove'5\' 6"'UXTbsNo$  (1.676 m)    General: Well developed, well nourished, in no acute distress  Skin : Warm and dry.  Head: Normocephalic and atraumatic  Lungs: Respirations unlabored; clear to auscultation bilaterally without wheeze, rales, rhonchi  CVS exam: normal rate and regular rhythm.  Vessels: Symmetric bilaterally  Neurologic: Alert and oriented; speech intact; face symmetrical; moves all extremities well; CNII-XII intact without focal deficit   Assessment:  1. Diabetes mellitus without complication (HCC)   2. Other fatigue   3. Dysuria   4. Weight loss     Plan:  Update labs today; reassurance to patient that she is doing very well with her diabetes control; encouraged to try and drink 2 Glucerna shakes daily; 3.   Check Urine culture;  4.   Patient's weight is stable today; family is concerned that she is not eating regularly; will put in referral for social work to look at options to help keep patient in her home safely. Daughters are in agreement;   This visit occurred during the SARS-CoV-2 public health emergency.  Safety protocols were in place, including screening questions prior to the visit, additional usage of staff PPE, and extensive cleaning of exam room while observing appropriate contact time as indicated for disinfecting solutions.    No follow-ups on file.  Orders Placed This Encounter  Procedures   Urine Culture   DG Chest 2 View    Standing Status:   Future    Standing Expiration Date:   06/11/2022    Order Specific Question:   Reason for Exam (SYMPTOM  OR DIAGNOSIS REQUIRED)    Answer:   weight loss    Order Specific Question:   Preferred imaging location?     Answer:   [facility]   CBC with Differential/Platelet   Comp Met (CMET)   TSH   Hemoglobin A1c    Requested Prescriptions    No prescriptions requested or ordered in this encounter

## 2021-06-12 LAB — COMPREHENSIVE METABOLIC PANEL
ALT: 5 U/L (ref 0–35)
AST: 13 U/L (ref 0–37)
Albumin: 4 g/dL (ref 3.5–5.2)
Alkaline Phosphatase: 37 U/L — ABNORMAL LOW (ref 39–117)
BUN: 36 mg/dL — ABNORMAL HIGH (ref 6–23)
CO2: 29 mEq/L (ref 19–32)
Calcium: 9.1 mg/dL (ref 8.4–10.5)
Chloride: 100 mEq/L (ref 96–112)
Creatinine, Ser: 1.23 mg/dL — ABNORMAL HIGH (ref 0.40–1.20)
GFR: 40.57 mL/min — ABNORMAL LOW (ref >60.00)
Glucose, Bld: 292 mg/dL — ABNORMAL HIGH (ref 70–99)
Potassium: 4.8 mEq/L (ref 3.5–5.1)
Sodium: 138 mEq/L (ref 135–145)
Total Bilirubin: 0.5 mg/dL (ref 0.2–1.2)
Total Protein: 6.3 g/dL (ref 6.0–8.3)

## 2021-06-12 LAB — URINE CULTURE
MICRO NUMBER:: 12731957
SPECIMEN QUALITY:: ADEQUATE

## 2021-06-12 LAB — HEMOGLOBIN A1C: Hgb A1c MFr Bld: 8.2 % — ABNORMAL HIGH (ref 4.6–6.5)

## 2021-06-12 LAB — CBC WITH DIFFERENTIAL/PLATELET
Basophils Absolute: 0.1 10*3/uL (ref 0.0–0.1)
Basophils Relative: 0.6 % (ref 0.0–3.0)
Eosinophils Absolute: 0 10*3/uL (ref 0.0–0.7)
Eosinophils Relative: 0.2 % (ref 0.0–5.0)
HCT: 36.2 % (ref 36.0–46.0)
Hemoglobin: 11.6 g/dL — ABNORMAL LOW (ref 12.0–15.0)
Lymphocytes Relative: 5.5 % — ABNORMAL LOW (ref 12.0–46.0)
Lymphs Abs: 0.5 10*3/uL — ABNORMAL LOW (ref 0.7–4.0)
MCHC: 32.1 g/dL (ref 30.0–36.0)
MCV: 91.2 fl (ref 78.0–100.0)
Monocytes Absolute: 0.5 10*3/uL (ref 0.1–1.0)
Monocytes Relative: 5.8 % (ref 3.0–12.0)
Neutro Abs: 7.3 10*3/uL (ref 1.4–7.7)
Neutrophils Relative %: 87.9 % — ABNORMAL HIGH (ref 43.0–77.0)
Platelets: 192 10*3/uL (ref 150.0–400.0)
RBC: 3.97 Mil/uL (ref 3.87–5.11)
RDW: 14.1 % (ref 11.5–15.5)
WBC: 8.3 10*3/uL (ref 4.0–10.5)

## 2021-06-12 LAB — TSH: TSH: 0.8 u[IU]/mL (ref 0.35–5.50)

## 2021-06-15 ENCOUNTER — Telehealth: Payer: Self-pay | Admitting: *Deleted

## 2021-06-15 NOTE — Chronic Care Management (AMB) (Signed)
  Chronic Care Management   Note  06/15/2021 Name: Lempi Edwin MRN: [id number] DOB: 10/01/1935  [name] is a 85 y.o. year old female who is a primary care patient of Marrian Salvage, [place]. I reached out to [name] by phone today in response to a referral sent by Ms. Earnie Larsson PCP.  [name] was given information about Chronic Care Management services today including:  CCM service includes personalized support from designated clinical staff supervised by her physician, including individualized plan of care and coordination with other care providers 24/7 contact phone numbers for assistance for urgent and routine care needs. Service will only be billed when office clinical staff spend 20 minutes or more in a month to coordinate care. Only one practitioner may furnish and bill the service in a calendar month. The patient may stop CCM services at any time (effective at the end of the month) by phone call to the office staff. The patient is responsible for co-pay (up to 20% after annual deductible is met) if co-pay is required by the individual health plan.   Patient agreed to services and verbal consent obtained.   Follow up plan: Telephone appointment with care management team member scheduled for: 06/19/2021  Julian Hy, [place] [facility]  Direct Dial: [phone]

## 2021-06-16 ENCOUNTER — Other Ambulatory Visit: Payer: Self-pay | Admitting: Family

## 2021-06-16 MED ORDER — GLIPIZIDE ER 5 MG PO TB24: 5.0000 mg | ORAL_TABLET | Freq: Every day | ORAL | 0 refills | Status: DC

## 2021-06-16 NOTE — Progress Notes (Signed)
I spoke with patient's daughter regarding my concerns. The family is in agreement that there are questions about whether/ how she is taking her medication. The patient's daughter will be going to her mother's home this weekend to try and get this question clarified. I will also change her Glipizide to XL formulation. Explained to family that I am hesitant to make extensive changes in case she is not taking her medication; discussed some of the newer medications but concern that the side effects of the medication outweigh the benefit for the patient. The daughter will let me know early next week answers regarding medication questions. If she is felt to be taking her medication regularly, will plan for abdominal/ pelvic CT due to weight loss/ sudden changes in blood sugar.

## 2021-06-18 NOTE — Chronic Care Management (AMB) (Addendum)
Chronic Care Management  ° °Note ° °06/18/2021 °Name: Melanie Hughes MRN: [id number] DOB: 05/02/1936 ° °Melanie Hughes is a 85 y.o. year old female who is a primary care patient of Murray, Laura Woodruff, FNP. I reached out to Melanie Hughes by phone today in response to a referral sent by Ms. Melanie Hughes's PCP. ° °Melanie Hughes was given information about Chronic Care Management services today including:  °CCM service includes personalized support from designated clinical staff supervised by her physician, including individualized plan of care and coordination with other care providers °24/7 contact phone numbers for assistance for urgent and routine care needs. °Service will only be billed when office clinical staff spend 20 minutes or more in a month to coordinate care. °Only one practitioner may furnish and bill the service in a calendar month. °The patient may stop CCM services at any time (effective at the end of the month) by phone call to the office staff. °The patient is responsible for co-pay (up to 20% after annual deductible is met) if co-pay is required by the individual health plan.  ° °Patient did not agree to enrollment in care management services and does not wish to consider at this time. ° °Follow up plan: °Pt daughter (Melanie Hughes) called and says pt declines any CCM services and requests to cx appt says she doesn't need any further help or resources at this time. Daughter made aware if situation changes to call us back. Patient declines further follow up and engagement by the care management team. Appropriate care team members and provider have been notified via electronic communication.  ° ° , CCMA °Care Guide, Embedded Care Coordination °[place]   Care Management  °Direct Dial: [phone] ° ° °

## 2021-06-19 ENCOUNTER — Telehealth: Payer: Medicare Other

## 2021-06-19 ENCOUNTER — Telehealth: Payer: Self-pay | Admitting: Family

## 2021-06-19 NOTE — Telephone Encounter (Signed)
I wanted to see how her blood sugars are doing with the medication change. If no improvement, I want to get a CT of her abdomen ordered.  If they want this done in [place], can they give [place] name of radiology center there? Or are they okay to come to [facility]?

## 2021-06-22 NOTE — Telephone Encounter (Signed)
I have called the pt and spoke to her daughter. I have relayed the message about the CT scan and they would like the order sent to [facility] diagnostic imaging center.   She also reported that her mom is still not eating much at night. Last night she has had a grilled cheese and that morning she was not hungry.   Last few sugars have been 104 last night, 258 the night before, 235 the day before that. Daughter reports that she wakes up to take her medication and then goes to bed and she has no energy to do anything else lately.  They just started the new med Glipizide on Wed 06/17/21.

## 2021-06-23 ENCOUNTER — Other Ambulatory Visit: Payer: Self-pay | Admitting: Family

## 2021-06-23 DIAGNOSIS — R634 Abnormal weight loss: Secondary | ICD-10-CM

## 2021-06-23 NOTE — Telephone Encounter (Signed)
Let's get imaging of her abdomen as we have discussed previously. If they want to do it in [place], can they provide information of location for where they want to go? Other option would be to go to [facility]?

## 2021-06-29 ENCOUNTER — Other Ambulatory Visit: Payer: Self-pay | Admitting: Family

## 2021-07-02 ENCOUNTER — Other Ambulatory Visit: Payer: Self-pay | Admitting: Family

## 2021-07-20 ENCOUNTER — Encounter: Payer: Self-pay | Admitting: *Deleted

## 2021-07-20 ENCOUNTER — Ambulatory Visit: Payer: Medicare Other | Admitting: Neurology

## 2021-07-20 ENCOUNTER — Encounter: Payer: Self-pay | Admitting: Neurology

## 2021-07-20 VITALS — BP 117/70 | HR 75 | Ht 62.0 in | Wt 120.8 lb

## 2021-07-20 DIAGNOSIS — G20 Parkinson's disease: Secondary | ICD-10-CM | POA: Diagnosis not present

## 2021-07-20 DIAGNOSIS — R413 Other amnesia: Secondary | ICD-10-CM | POA: Diagnosis not present

## 2021-07-20 MED ORDER — CARBIDOPA-LEVODOPA 25-100 MG PO TABS: 1.0000 | ORAL_TABLET | Freq: Three times a day (TID) | ORAL | 1 refills | Status: AC

## 2021-07-20 NOTE — Patient Instructions (Addendum)
It was nice to meet you both today.  As discussed, we will have you continue with your Parkinson's medication, Sinemet generic 25-100 mg strength, take 1 pill 3 times daily at 7 AM, 11 AM and 4 PM daily.  We may increase this at the next visit.  For your memory loss, we will consider memory medication at the next visit.  You were not able to tolerate donepezil in the past.  We may be able to consider something different. Please talk to family about long-term planning situation, you may not be fully safe to live by herself but you do seem to have a good support system.  I would not recommend that you drive any longer.  Please use your cane for gait safety at all times.  I would be happy to make a referral to physical therapy outpatient or home health if you agree.  Please let me know.  I would recommend that you get a walker to use for longer distances and for coming to the doctor's.

## 2021-07-20 NOTE — Progress Notes (Signed)
Subjective:    Patient ID: Melanie Hughes is a 86 y.o. female.  HPI    Melanie Age, MD, PhD [facility] [address] [place], [place] [address]  Dear Melanie Hughes,   I saw your patient, Melanie Hughes, upon you kind request, in my neurologic clinic today for initial consultation of her parkinsonism, for transfer of care from her neurologist from [place], [place].  The patient is accompanied by her daughter, [name], today.  As you know, Melanie Hughes is an 86 year old right-handed woman with an underlying medical history of diabetes, breast cancer, hypertension, hyperlipidemia, history of anemia, history of left hip fracture, anxiety, and vitamin D deficiency, who reports an approximately 5-year history of left hand tremors and Parkinson symptoms which were originally noticed some 5 years ago.  She was seeing Melanie Hughes in sports medicine at the time and he recommended evaluation.  I reviewed your office note from 06/11/2021.  I was able to review some prior Neurology records.  She had an office visit with Melanie Hughes at [facility] on 12/31/2020.  She was on Sinemet at the time, dose and timing were not available on that record.  Her daughter was concerned about patient's memory getting worse.  Patient could not tolerate Aricept according to the office note.  She was on Ativan twice daily and Lexapro 20 mg daily.  She had an office visit with Melanie Hughes on 09/30/2020.  She had a prior office visit on 06/02/2020. She has been on carbidopa-levodopa 25-100 mg strength 1 pill 3 times daily.  She reports taking 1 with breakfast or before breakfast, 1 pill with supper and 1 pill at bedtime.  She has had forgetfulness.  She is currently not on any medication for her memory.  She has not fallen recently, she uses a cane when needed.  She has a walker at the house but has not used it, this was her husband's walker.  Her husband passed  away about 5 years ago and she has been living by herself.  She has 4 grown children, 3 in [place] and 1 daughter in [place].  [name] lives in [place].  Patient's son provides meals, Meals on Wheels was discussed but declined by the patient.  She also recently declined home health therapy.  She does not want anybody coming to the house.  She lives alone.  She has a call alert button.  She is a non-smoker and does not drink any alcohol.  She tries to hydrate well but estimates that she drinks about 3 to 4 cups of water per day.  She has had some weight loss.  She is taking supplements such as Glucerna up to twice daily.  She had a head CT without contrast on 06/28/2018 and I reviewed the results: IMPRESSION: No acute intracranial pathology.   She does not have much in the way of constipation.  She has not driven a car in years.   Her Past Medical History Is Significant For: Past Medical History:  Diagnosis Date   Cancer (Melanie Hughes)    breast   Diabetes mellitus without complication (Melanie Hughes)    High blood cholesterol    Hypertension     Her Past Surgical History Is Significant For: Past Surgical History:  Procedure Laterality Date   ABDOMINAL HYSTERECTOMY     JOINT REPLACEMENT     MASTECTOMY     ORIF ELBOW FRACTURE Left 07/01/2017   Procedure: OPEN REDUCTION INTERNAL FIXATION (ORIF) ELBOW/OLECRANON FRACTURE;  Surgeon: Roby Lofts, MD;  Location: [facility] OR;  Service: Orthopedics;  Laterality: Left;   WRIST SURGERY      Her Family History Is Significant For: Family History  Problem Relation Hughes of Onset   Parkinson's disease Mother    Alzheimer's disease Sister    Dementia Sister    Obesity Grandchild     Her Social History Is Significant For: Social History   Socioeconomic History   Marital status: Widowed    Spouse name: Not on file   Number of children: 3   Years of education: Not on file   Highest education level: Not on file  Occupational History   Occupation: retired   Tobacco Use   Smoking status: Never   Smokeless tobacco: Never  Vaping Use   Vaping Use: Never used  Substance and Sexual Activity   Alcohol use: No   Drug use: No   Sexual activity: Never  Other Topics Concern   Not on file  Social History Narrative   Not on file   Social Determinants of Health   Financial Resource Strain: Low Risk    Difficulty of Paying Living Expenses: Not hard at all  Food Insecurity: No Food Insecurity   Worried About Programme researcher, broadcasting/film/video in the Last Year: Never true   Ran Out of Food in the Last Year: Never true  Transportation Needs: No Transportation Needs   Lack of Transportation (Medical): No   Lack of Transportation (Non-Medical): No  Physical Activity: Inactive   Days of Exercise per Week: 0 days   Minutes of Exercise per Session: 0 min  Stress: No Stress Concern Present   Feeling of Stress : Not at all  Social Connections: Moderately Isolated   Frequency of Communication with Friends and Family: More than three times a week   Frequency of Social Gatherings with Friends and Family: More than three times a week   Attends Religious Services: More than 4 times per year   Active Member of [employer] or Organizations: No   Attends Banker Meetings: Never   Marital Status: Widowed    Her Allergies Are:  Allergies  Allergen Reactions   Morphine And Related Other (See Comments)    Confusion and agitation   Penicillins Itching  :   Her Current Medications Are:  Outpatient Encounter Medications as of 07/20/2021  Medication Sig   aspirin EC 81 MG tablet Take 81 mg by mouth daily.   bisacodyl (DULCOLAX) 5 MG EC tablet Take 1 tablet (5 mg total) by mouth daily as needed for moderate constipation.   blood glucose meter kit and supplies KIT Use up to 4x daily as directed---diagnosis code E11.9   Calcium Carb-Cholecalciferol (CALCIUM 500+D PO) Calcium 500 + D   carbidopa-levodopa (SINEMET IR) 25-100 MG tablet Take 1 tablet by mouth 3 (three)  times daily.   CONTOUR NEXT TEST test strip    [employer] COQ-10 200 MG capsule TAKE 1 CAPSULE BY MOUTH EVERY DAY   escitalopram (LEXAPRO) 20 MG tablet TAKE 1 TABLET BY MOUTH EVERYDAY AT BEDTIME   fluticasone (FLONASE) 50 MCG/ACT nasal spray Place 2 sprays into both nostrils daily.   glipiZIDE (GLUCOTROL XL) 5 MG 24 hr tablet Take 1 tablet (5 mg total) by mouth daily with breakfast.   JANUVIA 50 MG tablet TAKE 1 TABLET BY MOUTH EVERY DAY   lisinopril-hydrochlorothiazide (ZESTORETIC) 20-12.5 MG tablet TAKE 1 TABLET BY MOUTH EVERY DAY IN THE MORNING   LITE TOUCH LANCETS MISC  LORazepam (ATIVAN) 0.5 MG tablet Take 1 tablet (0.5 mg total) by mouth 2 (two) times daily. for anxiety   metFORMIN (GLUCOPHAGE) 1000 MG tablet TAKE 1 TABLET BY MOUTH TWICE A DAY   Multiple Vitamin (MULTIVITAMIN WITH MINERALS) TABS tablet Take 1 tablet by mouth daily.   Multiple Vitamins-Minerals (PRESERVISION AREDS 2+MULTI VIT) CAPS Take 2 capsules by mouth daily.   pioglitazone (ACTOS) 30 MG tablet Take 1 tablet (30 mg total) by mouth daily.   simvastatin (ZOCOR) 10 MG tablet TAKE 1 TABLET BY MOUTH EVERY DAY   UNABLE TO FIND nitrofurantoin monohydrate/macrocrystals 100 mg capsule   Vitamin D, Ergocalciferol, (DRISDOL) 1.25 MG (50000 UNIT) CAPS capsule Take 1 capsule (50,000 Units total) by mouth every 7 (seven) days.   No facility-administered encounter medications on file as of 07/20/2021.  :   Review of Systems:  Out of a complete 14 point review of systems, all are reviewed and negative with the exception of these symptoms as listed below:   Review of Systems  Neurological:        Pt is here for parkinson's . Daughter states they are here for a second opinion. Pt has tremors, memory loss. Daughter states has difficulty   getting in and out of the car . Daughter states patient does live at home alone. Pt states her stomach is jumping. Daughter states her readings for her Blood sugar is elevated .Daughter states that  patient is not eating a lot . Pt did try Donepezil but could not take because of stomach pains and increased tremors    Objective:  Neurological Exam  Physical Exam Physical Examination:   Vitals:   07/20/21 1500  BP: 117/70  Pulse: 75    General Examination: The patient is a very pleasant 86 y.o. female in no acute distress. She appears well-developed and well-nourished and well groomed.   HEENT: Normocephalic, atraumatic, pupils are equal, round and reactive to light, extraocular tracking is fairly well-preserved, hearing is mildly impaired, she has bilateral hearing aids.  She has mild to moderate nuchal rigidity.  She has mild hypophonia, no dysarthria.  She has no significant lip, neck or jaw tremor.  No carotid bruits.  Airway examination reveals mild mouth dryness.  Tongue protrudes centrally and palate elevates symmetrically.    Chest: Clear to auscultation without wheezing, rhonchi or crackles noted.  Heart: S1+S2+0, regular and normal without murmurs, rubs or gallops noted.   Abdomen: Soft, non-tender and non-distended with normal bowel sounds appreciated on auscultation.  Extremities: There is no pitting edema in the distal lower extremities bilaterally.   Skin: Warm and dry without trophic changes noted.   Musculoskeletal: exam reveals no obvious major joint deformities, mild arthritic changes in both hands.    Neurologically:  Mental status: The patient is awake, alert and oriented in all 4 spheres. Her immediate and remote memory, attention, language skills and fund of knowledge are mildly impaired.  Details of her history are provided by her daughter.  Thought process is linear. Mood is normal and affect is normal.  Cranial nerves II - XII are as described above under HEENT exam.  Motor exam: Thin bulk, global strength of at least 4 out of 5, no focal weakness.  She has a mild intermittent resting tremor in both upper extremities.  She has a slight tremor in the right  lower extremity intermittently.  She has increase in tone in both upper extremities, left side with cogwheeling noted.  Romberg is not tested for safety  concerns.  Fine motor skills are moderately impaired bilaterally, left-sided lateralization noted.  She has difficulty with finger taps and hand movements.    On 07/20/2021: On Archimedes spiral drawing she has mild trembling with the left hand, handwriting with the right hand is legible but quite micrographic.  Cerebellar testing: No dysmetria or intention tremor. There is no truncal or gait ataxia.  Sensory exam: intact to light touch in the upper and lower extremities.  Gait, station and balance: She stands without major difficulty, posture is mildly stooped.  She walks with mild gait insecurity, decreased stride length and pace and decreased arm swing, insecurity with turns.  She has no walking aid.     Assessment and Plan:  In summary, Hazely Sealey is a very pleasant 86 y.o.-year old female with an underlying medical history of diabetes, breast cancer, hypertension, hyperlipidemia, history of anemia, history of left hip fracture, anxiety, and vitamin D deficiency, who presents for evaluation of parkinsonism of about 5+ years.  She would like to transfer care from seeing Melanie Hughes in [place], [place].  Symptoms and examination are in keeping with left-sided predominant Parkinson's disease, associated with some memory loss.  She has tried donepezil but could not tolerate it.  She has been on levodopa therapy for the past approximately 5 years.  She is currently on 1 pill 3 times daily.  I would like for her to continue with this for now and we will consider increasing this to 4 times daily in the near future.  She is advised to take it before she eats to ensure better absorption.  To that end, she is advised to take it at 7 AM, 11 AM and 4 PM daily.  We talked about the importance of healthy lifestyle, good nutrition and good hydration with water.   We talked about the importance of fall prevention and gait safety.  She is advised to use her cane at all times and consider using a walker.  She declines a prescription for a walker today.  We talked about the therapy, I would recommend outpatient therapy or home health physical therapy, she declines a referral at this time.  She is advised to follow-up routinely in this clinic in about 3 months, sooner if needed.  We also talked about long-term living situation, she may not be fully safe to live by herself, thankfully, she does have a good support system with her kids and she also has a life alert and her neighbor also checks on her on a regular basis.  She is advised to consider tapering the Ativan.  She has been on it for anxiety but it can cause sedation and balance issues.  She is encouraged to talk to you about tapering off this medication.  I answered all their questions today and the patient and her daughter were in agreement.   Thank you very much for allowing me to participate in the care of this nice patient. If I can be of any further assistance to you please do not hesitate to call me at [phone].  Sincerely,   Melanie Age, MD, PhD

## 2021-08-11 ENCOUNTER — Encounter: Payer: Self-pay | Admitting: Family

## 2021-08-12 LAB — HM DEXA SCAN

## 2021-08-13 ENCOUNTER — Encounter: Payer: Self-pay | Admitting: Family

## 2021-08-13 NOTE — Progress Notes (Signed)
Dexa Abstracted.

## 2021-08-18 ENCOUNTER — Encounter: Payer: Self-pay | Admitting: Family

## 2021-08-19 ENCOUNTER — Encounter: Payer: Self-pay | Admitting: Family

## 2021-08-20 ENCOUNTER — Other Ambulatory Visit: Payer: Self-pay | Admitting: Family

## 2021-08-20 MED ORDER — LORAZEPAM 0.5 MG PO TABS: 0.5000 mg | ORAL_TABLET | Freq: Two times a day (BID) | ORAL | 1 refills | Status: AC

## 2021-08-24 ENCOUNTER — Other Ambulatory Visit: Payer: Self-pay | Admitting: Family

## 2021-08-25 ENCOUNTER — Other Ambulatory Visit: Payer: Self-pay | Admitting: Family

## 2021-08-27 ENCOUNTER — Other Ambulatory Visit: Payer: Self-pay

## 2021-08-27 MED ORDER — GLIPIZIDE ER 5 MG PO TB24: 5.0000 mg | ORAL_TABLET | Freq: Every day | ORAL | 0 refills | Status: AC

## 2021-09-04 ENCOUNTER — Emergency Department (HOSPITAL_BASED_OUTPATIENT_CLINIC_OR_DEPARTMENT_OTHER): Payer: Medicare Other

## 2021-09-04 ENCOUNTER — Encounter: Payer: Self-pay | Admitting: Family

## 2021-09-04 ENCOUNTER — Other Ambulatory Visit (INDEPENDENT_AMBULATORY_CARE_PROVIDER_SITE_OTHER): Payer: Medicare Other

## 2021-09-04 ENCOUNTER — Other Ambulatory Visit: Payer: Self-pay | Admitting: Family

## 2021-09-04 ENCOUNTER — Emergency Department (HOSPITAL_BASED_OUTPATIENT_CLINIC_OR_DEPARTMENT_OTHER)
Admission: EM | Admit: 2021-09-04 | Discharge: 2021-09-04 | Disposition: A | Discharge status: Home / Self Care | Payer: Medicare Other | Attending: Emergency Medicine | Admitting: Emergency Medicine

## 2021-09-04 ENCOUNTER — Other Ambulatory Visit: Payer: Self-pay

## 2021-09-04 ENCOUNTER — Ambulatory Visit: Payer: Medicare Other | Admitting: Family

## 2021-09-04 ENCOUNTER — Encounter (HOSPITAL_BASED_OUTPATIENT_CLINIC_OR_DEPARTMENT_OTHER): Payer: Self-pay | Admitting: *Deleted

## 2021-09-04 VITALS — BP 130/68 | HR 64 | Temp 97.8°F | Resp 30 | Ht 62.0 in | Wt 121.0 lb

## 2021-09-04 DIAGNOSIS — W19XXXA Unspecified fall, initial encounter: Secondary | ICD-10-CM

## 2021-09-04 DIAGNOSIS — W010XXA Fall on same level from slipping, tripping and stumbling without subsequent striking against object, initial encounter: Secondary | ICD-10-CM

## 2021-09-04 DIAGNOSIS — S0083XA Contusion of other part of head, initial encounter: Secondary | ICD-10-CM | POA: Diagnosis not present

## 2021-09-04 DIAGNOSIS — G20 Parkinson's disease: Secondary | ICD-10-CM | POA: Diagnosis not present

## 2021-09-04 DIAGNOSIS — Z79899 Other long term (current) drug therapy: Secondary | ICD-10-CM | POA: Diagnosis not present

## 2021-09-04 DIAGNOSIS — F419 Anxiety disorder, unspecified: Secondary | ICD-10-CM

## 2021-09-04 DIAGNOSIS — R300 Dysuria: Secondary | ICD-10-CM

## 2021-09-04 DIAGNOSIS — E119 Type 2 diabetes mellitus without complications: Secondary | ICD-10-CM

## 2021-09-04 DIAGNOSIS — W06XXXA Fall from bed, initial encounter: Secondary | ICD-10-CM | POA: Diagnosis not present

## 2021-09-04 DIAGNOSIS — Z7982 Long term (current) use of aspirin: Secondary | ICD-10-CM | POA: Diagnosis not present

## 2021-09-04 DIAGNOSIS — L989 Disorder of the skin and subcutaneous tissue, unspecified: Secondary | ICD-10-CM

## 2021-09-04 DIAGNOSIS — S0990XA Unspecified injury of head, initial encounter: Secondary | ICD-10-CM | POA: Diagnosis present

## 2021-09-04 LAB — COMPREHENSIVE METABOLIC PANEL
ALT: 13 U/L (ref 0–35)
AST: 17 U/L (ref 0–37)
Albumin: 4.4 g/dL (ref 3.5–5.2)
Alkaline Phosphatase: 49 U/L (ref 39–117)
BUN: 33 mg/dL — ABNORMAL HIGH (ref 6–23)
CO2: 32 mEq/L (ref 19–32)
Calcium: 9.5 mg/dL (ref 8.4–10.5)
Chloride: 98 mEq/L (ref 96–112)
Creatinine, Ser: 0.95 mg/dL (ref 0.40–1.20)
GFR: 55.22 mL/min — ABNORMAL LOW (ref >60.00)
Glucose, Bld: 288 mg/dL — ABNORMAL HIGH (ref 70–99)
Potassium: 4.8 mEq/L (ref 3.5–5.1)
Sodium: 137 mEq/L (ref 135–145)
Total Bilirubin: 0.5 mg/dL (ref 0.2–1.2)
Total Protein: 7.2 g/dL (ref 6.0–8.3)

## 2021-09-04 LAB — CBC WITH DIFFERENTIAL/PLATELET
Basophils Absolute: 0 10*3/uL (ref 0.0–0.1)
Basophils Relative: 0.2 % (ref 0.0–3.0)
Eosinophils Absolute: 0 10*3/uL (ref 0.0–0.7)
Eosinophils Relative: 0.2 % (ref 0.0–5.0)
HCT: 38.8 % (ref 36.0–46.0)
Hemoglobin: 12.6 g/dL (ref 12.0–15.0)
Lymphocytes Relative: 6.7 % — ABNORMAL LOW (ref 12.0–46.0)
Lymphs Abs: 0.6 10*3/uL — ABNORMAL LOW (ref 0.7–4.0)
MCHC: 32.5 g/dL (ref 30.0–36.0)
MCV: 90 fl (ref 78.0–100.0)
Monocytes Absolute: 0.5 10*3/uL (ref 0.1–1.0)
Monocytes Relative: 5.5 % (ref 3.0–12.0)
Neutro Abs: 7.3 10*3/uL (ref 1.4–7.7)
Neutrophils Relative %: 87.4 % — ABNORMAL HIGH (ref 43.0–77.0)
Platelets: 178 10*3/uL (ref 150.0–400.0)
RBC: 4.31 Mil/uL (ref 3.87–5.11)
RDW: 13.8 % (ref 11.5–15.5)
WBC: 8.4 10*3/uL (ref 4.0–10.5)

## 2021-09-04 LAB — HEMOGLOBIN A1C: Hgb A1c MFr Bld: 9.1 % — ABNORMAL HIGH (ref 4.6–6.5)

## 2021-09-04 NOTE — Discharge Instructions (Signed)
It was our pleasure to provide your ER care today - we hope that you feel better. ? ?Your ct scan looks good. ? ?Fall precautions.  ? ?Return to ER if worse, new symptoms, new/severe pain, fainting, or other concern.  ?

## 2021-09-04 NOTE — ED Provider Notes (Signed)
[facility] EMERGENCY DEPARTMENT Provider Note   CSN: [id number] Arrival date & time: 09/04/21  1207     History  Chief Complaint  Patient presents with   Fall   Head Injury    Melanie Hughes is a 86 y.o. female.  Pt c/o fall yesterday AM at home. States was going back to bed from having gone to bathroom, was trying to sit on edge of bed and then turn, but missed bed, and fell forward hitting face. Contusion/bruising to left forehead and above left eye. Denies any faintness or dizziness prior to fall - indicates was mechanical fall. No loc w fall. No headache. No neck or back pain. No nausea/vomiting. Was able to crawl to door/open door and was helped up. No prolonged period on ground. States today feels fine, went to pcp office who sent to ED for CT.  No anticoag use.   The history is provided by the patient, medical records, a relative and a caregiver.  Fall Pertinent negatives include no chest pain, no abdominal pain, no headaches and no shortness of breath.  Head Injury Associated symptoms: no headaches, no neck pain, no numbness and no vomiting       Home Medications Prior to Admission medications   Medication Sig Start Date End Date Taking? Authorizing Provider  aspirin EC 81 MG tablet Take 81 mg by mouth daily.    [provider]  bisacodyl (DULCOLAX) 5 MG EC tablet Take 1 tablet (5 mg total) by mouth daily as needed for moderate constipation. 07/04/17   Calvert Cantor, MD  blood glucose meter kit and supplies KIT Use up to 4x daily as directed---diagnosis code E11.9 01/16/18   Olive Bass, FNP  Calcium Carb-Cholecalciferol (CALCIUM 500+D PO) Calcium 500 + D    [provider]  carbidopa-levodopa (SINEMET IR) 25-100 MG tablet Take 1 tablet by mouth 3 (three) times daily. Take at 7 AM, 11 AM and 4 PM daily. 07/20/21   Huston Foley, MD  CONTOUR NEXT TEST test strip  01/17/18   [provider]  [employer] COQ-10 200 MG capsule TAKE 1 CAPSULE  BY MOUTH EVERY DAY 02/14/19   Judi Saa, DO  escitalopram (LEXAPRO) 20 MG tablet TAKE 1 TABLET BY MOUTH EVERYDAY AT BEDTIME 09/01/20   Olive Bass, FNP  fluticasone [facility]) 50 MCG/ACT nasal spray Place 2 sprays into both nostrils daily. Patient not taking: Reported on 09/04/2021 11/20/18   Olive Bass, FNP  glipiZIDE (GLUCOTROL XL) 5 MG 24 hr tablet Take 1 tablet (5 mg total) by mouth daily with breakfast. 08/27/21   Olive Bass, FNP  JANUVIA 50 MG tablet TAKE 1 TABLET BY MOUTH EVERY DAY 06/30/21   Olive Bass, FNP  lisinopril-hydrochlorothiazide (ZESTORETIC) 20-12.5 MG tablet TAKE 1 TABLET BY MOUTH EVERY DAY IN THE MORNING 02/27/21   Olive Bass, FNP  LITE [facility] LANCETS MISC  01/17/18   [provider]  LORazepam (ATIVAN) 0.5 MG tablet Take 1 tablet (0.5 mg total) by mouth 2 (two) times daily. for anxiety 08/20/21   Olive Bass, FNP  metFORMIN (GLUCOPHAGE) 1000 MG tablet TAKE 1 TABLET BY MOUTH TWICE A DAY 08/24/21   Olive Bass, FNP  Multiple Vitamin (MULTIVITAMIN WITH MINERALS) TABS tablet Take 1 tablet by mouth daily.    [provider]  Multiple Vitamins-Minerals (PRESERVISION AREDS 2+MULTI VIT) CAPS Take 2 capsules by mouth daily. Patient not taking: Reported on 09/04/2021    [provider]  pioglitazone (ACTOS) 30 MG tablet Take 1 tablet (30 mg total) by mouth daily. 06/06/20   Marrian Salvage, FNP  simvastatin (ZOCOR) 10 MG tablet TAKE 1 TABLET BY MOUTH EVERY DAY 12/31/20   Marrian Salvage, FNP  UNABLE TO FIND nitrofurantoin monohydrate/macrocrystals 100 mg capsule Patient not taking: Reported on 09/04/2021    [provider]  Vitamin D, Ergocalciferol, (DRISDOL) 1.25 MG (50000 UNIT) CAPS capsule Take 1 capsule (50,000 Units total) by mouth every 7 (seven) days. Patient not taking: Reported on 09/04/2021 02/18/20   Lyndal Pulley, DO      Allergies    Morphine and  related and Penicillins    Review of Systems   Review of Systems  Constitutional:  Negative for fever.  HENT:  Negative for nosebleeds.   Eyes:  Negative for pain and visual disturbance.  Respiratory:  Negative for shortness of breath.   Cardiovascular:  Negative for chest pain.  Gastrointestinal:  Negative for abdominal pain and vomiting.  Musculoskeletal:  Negative for back pain and neck pain.  Skin:  Negative for wound.  Neurological:  Negative for weakness, numbness and headaches.  Hematological:  Does not bruise/bleed easily.   Physical Exam Updated Vital Signs BP 126/73 (BP Location: Right Arm)    Pulse 66    Temp (!) 97.5 F (36.4 C) (Oral)    Resp 17    Ht 1.575 m ($Remove'5\' 2"'NobbCSO$ )    Wt 54.9 kg    SpO2 98%    BMI 22.14 kg/m  Physical Exam Vitals and nursing note reviewed.  Constitutional:      Appearance: Normal appearance. She is well-developed.  HENT:     Head:     Comments: Bruising left forehead and above left eye. Facial bones/orbits appear grossly intact.     Nose: Nose normal.     Mouth/Throat:     Mouth: Mucous membranes are moist.  Eyes:     General: No scleral icterus.    Conjunctiva/sclera: Conjunctivae normal.     Pupils: Pupils are equal, round, and reactive to light.  Neck:     Trachea: No tracheal deviation.  Cardiovascular:     Rate and Rhythm: Normal rate and regular rhythm.     Pulses: Normal pulses.     Heart sounds: Normal heart sounds. No murmur heard.   No friction rub. No gallop.  Pulmonary:     Effort: Pulmonary effort is normal. No respiratory distress.     Breath sounds: Normal breath sounds.  Chest:     Chest wall: No tenderness.  Abdominal:     General: There is no distension.     Tenderness: There is no abdominal tenderness. There is no guarding.  Genitourinary:    Comments: No cva tenderness.  Musculoskeletal:        General: No swelling.     Cervical back: Normal range of motion and neck supple. No rigidity or tenderness. No muscular  tenderness.     Comments: CTLS spine, non tender, aligned, no step off. Good rom bil extremities without pain or focal bony tenderness.   Skin:    General: Skin is warm and dry.     Findings: No rash.  Neurological:     Mental Status: She is alert.     Comments: Alert, speech normal. GCS 15. Motor/sens grossly intact bil.   Psychiatric:        Mood and Affect: Mood normal.    ED Results / Procedures / Treatments   Labs (  all labs ordered are listed, but only abnormal results are displayed) Labs Reviewed - No data to display  EKG None  Radiology CT Head Wo Contrast  Result Date: 09/04/2021 CLINICAL DATA:  Head trauma status post fall yesterday EXAM: CT HEAD WITHOUT CONTRAST TECHNIQUE: Contiguous axial images were obtained from the base of the skull through the vertex without intravenous contrast. RADIATION DOSE REDUCTION: This exam was performed according to the departmental dose-optimization program which includes automated exposure control, adjustment of the mA and/or kV according to patient size and/or use of iterative reconstruction technique. COMPARISON:  06/28/2017 FINDINGS: Brain: No evidence of acute infarction, hemorrhage, extra-axial collection, ventriculomegaly, or mass effect. Generalized cerebral atrophy. Periventricular white matter low attenuation likely secondary to microangiopathy. Vascular: Cerebrovascular atherosclerotic calcifications are noted. Skull: Negative for fracture or focal lesion. Sinuses/Orbits: Visualized portions of the orbits are unremarkable. Visualized portions of the paranasal sinuses are unremarkable. Visualized portions of the mastoid air cells are unremarkable. Other: None. IMPRESSION: 1. No acute intracranial findings. 2. Chronic small vessel ischemic disease. Electronically Signed   By: Kathreen Devoid M.D.   On: 09/04/2021 12:44    Procedures Procedures    Medications Ordered in ED Medications - No data to display  ED Course/ Medical Decision  Making/ A&P                           Medical Decision Making Problems Addressed: Contusion of forehead, initial encounter: acute illness or injury that poses a threat to life or bodily functions Fall from slip, trip, or stumble, initial encounter: acute illness or injury that poses a threat to life or bodily functions  Amount and/or Complexity of Data Reviewed Independent Historian:     Details: family member and pcp office - additional hx External Data Reviewed: notes. Radiology: ordered and independent interpretation performed. Decision-making details documented in ED Course.  Imaging studies ordered.   Reviewed nursing notes and prior charts for additional history. External reports reviewed. Additional hx from family/daughter. Additional hx from pcp/doctor - called to give report.   CT reviewed/interpreted by me - no hem.  Pt appears stable for d/c.  Fall precautions.          Final Clinical Impression(s) / ED Diagnoses Final diagnoses:  None    Rx / DC Orders ED Discharge Orders     None         Lajean Saver, MD 09/04/21 1251

## 2021-09-04 NOTE — ED Notes (Signed)
Very alert and oriented, follows commands without hesitation, voices no complaints at this time, denies any pain or discomfort, neuro status WNL. Client assisted to wc and escorted to [place] area, family with client to take her home. AVS provided to pt and family, opportunity for questions provided prior to DC to home ?

## 2021-09-04 NOTE — Progress Notes (Signed)
?Melanie Hughes is a 86 y.o. female with the following history as recorded in EpicCare:  ?Patient Active Problem List  ? Diagnosis Date Noted  ? Parkinson's disease ([place]) 02/26/2019  ? Concussion 11/22/2018  ? Contusion of face 11/22/2018  ? Chondroma 10/14/2017  ? Normocytic anemia 06/29/2017  ? Hx of breast cancer 06/29/2017  ? Hypomagnesemia 06/29/2017  ? Hyperlipidemia 06/29/2017  ? Diabetes mellitus without complication ([place]) 02/15/4817  ? Hypertension 06/28/2017  ? Closed left hip fracture, initial encounter ([place]) 06/28/2017  ? Closed fracture of left olecranon process 06/28/2017  ? Anxiety 06/28/2017  ? Closed displaced fracture of greater trochanter of left femur ([place])   ?  ?Current Outpatient Medications  ?Medication Sig Dispense Refill  ? aspirin EC 81 MG tablet Take 81 mg by mouth daily.    ? bisacodyl (DULCOLAX) 5 MG EC tablet Take 1 tablet (5 mg total) by mouth daily as needed for moderate constipation. 30 tablet 0  ? blood glucose meter kit and supplies KIT Use up to 4x daily as directed---diagnosis code E11.9 1 each 0  ? Calcium Carb-Cholecalciferol (CALCIUM 500+D PO) Calcium 500 + D    ? carbidopa-levodopa (SINEMET IR) 25-100 MG tablet Take 1 tablet by mouth 3 (three) times daily. Take at 7 AM, 11 AM and 4 PM daily. 270 tablet 1  ? CONTOUR NEXT TEST test strip   1  ? [facility] COQ-10 200 MG capsule TAKE 1 CAPSULE BY MOUTH EVERY DAY 90 capsule 0  ? escitalopram (LEXAPRO) 20 MG tablet TAKE 1 TABLET BY MOUTH EVERYDAY AT BEDTIME 90 tablet 3  ? glipiZIDE (GLUCOTROL XL) 5 MG 24 hr tablet Take 1 tablet (5 mg total) by mouth daily with breakfast. 90 tablet 0  ? JANUVIA 50 MG tablet TAKE 1 TABLET BY MOUTH EVERY DAY 90 tablet 3  ? lisinopril-hydrochlorothiazide (ZESTORETIC) 20-12.5 MG tablet TAKE 1 TABLET BY MOUTH EVERY DAY IN THE MORNING 90 tablet 3  ? LITE TOUCH LANCETS MISC   1  ? LORazepam (ATIVAN) 0.5 MG tablet Take 1 tablet (0.5 mg total) by mouth 2 (two) times daily. for anxiety 60 tablet 1  ? metFORMIN  (GLUCOPHAGE) 1000 MG tablet TAKE 1 TABLET BY MOUTH TWICE A DAY 180 tablet 1  ? Multiple Vitamin (MULTIVITAMIN WITH MINERALS) TABS tablet Take 1 tablet by mouth daily.    ? pioglitazone (ACTOS) 30 MG tablet Take 1 tablet (30 mg total) by mouth daily. 90 tablet 3  ? simvastatin (ZOCOR) 10 MG tablet TAKE 1 TABLET BY MOUTH EVERY DAY 90 tablet 1  ? fluticasone (FLONASE) 50 MCG/ACT nasal spray Place 2 sprays into both nostrils daily. (Patient not taking: Reported on 09/04/2021) 16 g 11  ? Multiple Vitamins-Minerals (PRESERVISION AREDS 2+MULTI VIT) CAPS Take 2 capsules by mouth daily. (Patient not taking: Reported on 09/04/2021)    ? UNABLE TO FIND nitrofurantoin monohydrate/macrocrystals 100 mg capsule (Patient not taking: Reported on 09/04/2021)    ? Vitamin D, Ergocalciferol, (DRISDOL) 1.25 MG (50000 UNIT) CAPS capsule Take 1 capsule (50,000 Units total) by mouth every 7 (seven) days. (Patient not taking: Reported on 09/04/2021) 12 capsule 0  ? ?No current facility-administered medications for this visit.  ?  ?Allergies: Morphine and related and Penicillins  ?Past Medical History:  ?Diagnosis Date  ? Cancer [facility])   ? breast  ? Diabetes mellitus without complication ([place])   ? High blood cholesterol   ? Hypertension   ?  ?Past Surgical History:  ?Procedure Laterality Date  ? ABDOMINAL  HYSTERECTOMY    ? JOINT REPLACEMENT    ? MASTECTOMY    ? ORIF ELBOW FRACTURE Left 07/01/2017  ? Procedure: OPEN REDUCTION INTERNAL FIXATION (ORIF) ELBOW/OLECRANON FRACTURE;  Surgeon: Shona Needles, MD;  Location: [facility];  Service: Orthopedics;  Laterality: Left;  ? WRIST SURGERY    ?  ?Family History  ?Problem Relation Age of Onset  ? Parkinson's disease Mother   ? Alzheimer's disease Sister   ? Dementia Sister   ? Obesity Grandchild   ?  ?Social History  ? ?Tobacco Use  ? Smoking status: Never  ? Smokeless tobacco: Never  ?Substance Use Topics  ? Alcohol use: No  ?  ?Subjective:  ? ?Patient is accompanied by both of her daughters today; she fell  at her home yesterday and EMS was called to her home; does have bruising on her forehead/ left side of her face and "some knee pain;" opted not to go to ER for further evaluation yesterday;  ?Unfortunately, the patient is a limited historian and cannot provide a good explanation for how she fell/ if she lost consciousness; family is concerned as to how the front door of the home was opened.  ? ? ? ? ?Objective:  ?Vitals:  ? 09/04/21 1118  ?BP: 130/68  ?Pulse: 64  ?Resp: (!) 30  ?Temp: 97.8 ?F (36.6 ?C)  ?TempSrc: Oral  ?SpO2: 94%  ?Weight: 121 lb (54.9 kg)  ?Height: $RemoveB'5\' 2"'deAVlFgZ$  (1.575 m)  ?  ?General: Well developed, well nourished, in no acute distress  ?Skin : Warm and dry. Facial bruising/ swelling noted over left side of face ?Head: Normocephalic and atraumatic  ?Eyes: Sclera and conjunctiva clear; pupils round and reactive to light; extraocular movements intact  ?Ears: External normal; canals clear; tympanic membranes normal  ?Oropharynx: Pink, supple. No suspicious lesions  ?Neck: Supple without thyromegaly, adenopathy  ?Lungs: Respirations unlabored; clear to auscultation bilaterally without wheeze, rales, rhonchi  ?CVS exam: normal rate and regular rhythm.  ?Musculoskeletal: No deformities; no active joint inflammation  ?Extremities: No edema, cyanosis, clubbing  ?Vessels: Symmetric bilaterally  ?Neurologic: Alert and oriented; speech intact; face symmetrical; moves all extremities well; CNII-XII intact without focal deficit  ? ?Assessment:  ?1. Fall, initial encounter   ?2. Contusion of face, initial encounter   ?3. Parkinson's disease ([place])   ?4. Anxiety   ?5. Dysuria   ?6. Type 2 diabetes mellitus without complication, without long-term current use of insulin ([place])   ?7. Skin lesion   ?  ?Plan:  ?Due to nature of fall and bruising noted, am concerned for stroke as precipitating reason for fall; am unable to get outpatient imaging today and family/ patient agree to go to ER;  ? ?Stressed again concern with  patient's safety at home/ living by herself and asked patient to re-consider her objection to home health evaluation; she agrees this time and referral is updated; ? ?Refer to dermatology for abnormal skin lesions on right side of face- ?BCC; ? ?This visit occurred during the SARS-CoV-2 public health emergency.  Safety protocols were in place, including screening questions prior to the visit, additional usage of staff PPE, and extensive cleaning of exam room while observing appropriate contact time as indicated for disinfecting solutions.  ? ? ?No follow-ups on file.  ?Orders Placed This Encounter  ?Procedures  ? Urine Culture  ? CT HEAD WO CONTRAST (5MM)  ?  Standing Status:   Future  ?  Standing Expiration Date:   09/05/2022  ?  Order  Specific Question:   Preferred imaging location?  ?  Answer:   [facility]  ? CBC with Differential/Platelet  ? Comp Met (CMET)  ? Hemoglobin A1c  ? Ambulatory referral to Home Health  ?  Referral Priority:   Routine  ?  Referral Type:   Home Health Care  ?  Referral Reason:   Specialty Services Required  ?  Requested Specialty:   [place]  ?  Number of Visits Requested:   1  ?  ?Requested Prescriptions  ? ? No prescriptions requested or ordered in this encounter  ?  ? ?

## 2021-09-04 NOTE — ED Triage Notes (Addendum)
She fell yesterday am hitting her left forehead. No LOC. She is alert per norm. She does not take blood thinners. ?

## 2021-09-07 ENCOUNTER — Encounter: Payer: Self-pay | Admitting: Family

## 2021-09-07 ENCOUNTER — Other Ambulatory Visit: Payer: Self-pay | Admitting: Family

## 2021-09-07 DIAGNOSIS — E119 Type 2 diabetes mellitus without complications: Secondary | ICD-10-CM

## 2021-09-07 LAB — URINE CULTURE
MICRO NUMBER:: 13085362
SPECIMEN QUALITY:: ADEQUATE

## 2021-09-07 MED ORDER — NITROFURANTOIN MONOHYD MACRO 100 MG PO CAPS: 100.0000 mg | ORAL_CAPSULE | Freq: Two times a day (BID) | ORAL | 0 refills | Status: AC

## 2021-09-08 ENCOUNTER — Other Ambulatory Visit: Payer: Self-pay | Admitting: Family

## 2021-09-08 DIAGNOSIS — E119 Type 2 diabetes mellitus without complications: Secondary | ICD-10-CM

## 2021-09-21 ENCOUNTER — Ambulatory Visit: Payer: Medicare Other | Admitting: "Endocrinology

## 2021-09-21 ENCOUNTER — Encounter: Payer: Self-pay | Admitting: "Endocrinology

## 2021-09-21 ENCOUNTER — Other Ambulatory Visit: Payer: Self-pay

## 2021-09-21 VITALS — BP 116/68 | HR 72 | Ht 62.0 in | Wt 122.0 lb

## 2021-09-21 DIAGNOSIS — I10 Essential (primary) hypertension: Secondary | ICD-10-CM

## 2021-09-21 DIAGNOSIS — E782 Mixed hyperlipidemia: Secondary | ICD-10-CM

## 2021-09-21 DIAGNOSIS — E1165 Type 2 diabetes mellitus with hyperglycemia: Secondary | ICD-10-CM

## 2021-09-21 MED ORDER — METFORMIN HCL 500 MG PO TABS: 500.0000 mg | ORAL_TABLET | Freq: Two times a day (BID) | ORAL | 2 refills | Status: AC

## 2021-09-21 NOTE — Patient Instructions (Signed)

## 2021-09-21 NOTE — Progress Notes (Signed)
Endocrinology Consult Note       09/21/2021, 12:58 PM   Subjective:    Patient ID: Melanie Hughes, female    DOB: [birth year].  Melanie Hughes is being seen in consultation for management of currently uncontrolled symptomatic diabetes requested by  Pcp, No.   Past Medical History:  Diagnosis Date   Cancer (HCC)    breast   Diabetes mellitus without complication (HCC)    High blood cholesterol    Hypertension    Osteoporosis     Past Surgical History:  Procedure Laterality Date   ABDOMINAL HYSTERECTOMY     JOINT REPLACEMENT     MASTECTOMY     ORIF ELBOW FRACTURE Left 07/01/2017   Procedure: OPEN REDUCTION INTERNAL FIXATION (ORIF) ELBOW/OLECRANON FRACTURE;  Surgeon: Roby Lofts, MD;  Location: [facility] OR;  Service: Orthopedics;  Laterality: Left;   WRIST SURGERY      Social History   Socioeconomic History   Marital status: Widowed    Spouse name: Not on file   Number of children: 3   Years of education: Not on file   Highest education level: Not on file  Occupational History   Occupation: retired  Tobacco Use   Smoking status: Never   Smokeless tobacco: Never  Vaping Use   Vaping Use: Never used  Substance and Sexual Activity   Alcohol use: No   Drug use: No   Sexual activity: Never  Other Topics Concern   Not on file  Social History Narrative   Not on file   Social Determinants of Health   Financial Resource Strain: Low Risk    Difficulty of Paying Living Expenses: Not hard at all  Food Insecurity: No Food Insecurity   Worried About Programme researcher, broadcasting/film/video in the Last Year: Never true   Ran Out of Food in the Last Year: Never true  Transportation Needs: No Transportation Needs   Lack of Transportation (Medical): No   Lack of Transportation (Non-Medical): No  Physical Activity: Inactive   Days of Exercise per Week: 0 days   Minutes of Exercise per Session: 0 min  Stress: No Stress  Concern Present   Feeling of Stress : Not at all  Social Connections: Moderately Isolated   Frequency of Communication with Friends and Family: More than three times a week   Frequency of Social Gatherings with Friends and Family: More than three times a week   Attends Religious Services: More than 4 times per year   Active Member of [employer] or Organizations: No   Attends Banker Meetings: Never   Marital Status: Widowed    Family History  Problem Relation Age of Onset   Parkinson's disease Mother    Osteoporosis Mother    Cancer Father    Heart attack Father    Alzheimer's disease Sister    Dementia Sister    Obesity Grandchild     Outpatient Encounter Medications as of 09/21/2021  Medication Sig   metFORMIN (GLUCOPHAGE) 500 MG tablet Take 1 tablet (500 mg total) by mouth 2 (two) times daily with a meal.   aspirin EC 81 MG tablet Take  81 mg by mouth daily.   bisacodyl (DULCOLAX) 5 MG EC tablet Take 1 tablet (5 mg total) by mouth daily as needed for moderate constipation.   blood glucose meter kit and supplies KIT Use up to 4x daily as directed---diagnosis code E11.9   Calcium Carb-Cholecalciferol (CALCIUM 500+D PO) Calcium 500 + D   carbidopa-levodopa (SINEMET IR) 25-100 MG tablet Take 1 tablet by mouth 3 (three) times daily. Take at 7 AM, 11 AM and 4 PM daily.   CONTOUR NEXT TEST test strip    [employer] COQ-10 200 MG capsule TAKE 1 CAPSULE BY MOUTH EVERY DAY   escitalopram (LEXAPRO) 20 MG tablet TAKE 1 TABLET BY MOUTH EVERYDAY AT BEDTIME   glipiZIDE (GLUCOTROL XL) 5 MG 24 hr tablet Take 1 tablet (5 mg total) by mouth daily with breakfast.   JANUVIA 50 MG tablet TAKE 1 TABLET BY MOUTH EVERY DAY   lisinopril-hydrochlorothiazide (ZESTORETIC) 20-12.5 MG tablet TAKE 1 TABLET BY MOUTH EVERY DAY IN THE MORNING   LITE TOUCH LANCETS MISC    LORazepam (ATIVAN) 0.5 MG tablet Take 1 tablet (0.5 mg total) by mouth 2 (two) times daily. for anxiety   Multiple Vitamin (MULTIVITAMIN WITH  MINERALS) TABS tablet Take 1 tablet by mouth daily.   nitrofurantoin, macrocrystal-monohydrate, (MACROBID) 100 MG capsule Take 1 capsule (100 mg total) by mouth 2 (two) times daily. (Patient not taking: Reported on 09/21/2021)   simvastatin (ZOCOR) 10 MG tablet TAKE 1 TABLET BY MOUTH EVERY DAY   UNABLE TO FIND nitrofurantoin monohydrate/macrocrystals 100 mg capsule (Patient not taking: Reported on 09/04/2021)   Vitamin D, Ergocalciferol, (DRISDOL) 1.25 MG (50000 UNIT) CAPS capsule Take 1 capsule (50,000 Units total) by mouth every 7 (seven) days. (Patient not taking: Reported on 09/04/2021)   [DISCONTINUED] fluticasone (FLONASE) 50 MCG/ACT nasal spray Place 2 sprays into both nostrils daily. (Patient not taking: Reported on 09/04/2021)   [DISCONTINUED] metFORMIN (GLUCOPHAGE) 1000 MG tablet TAKE 1 TABLET BY MOUTH TWICE A DAY   [DISCONTINUED] Multiple Vitamins-Minerals (PRESERVISION AREDS 2+MULTI VIT) CAPS Take 2 capsules by mouth daily. (Patient not taking: Reported on 09/04/2021)   [DISCONTINUED] pioglitazone (ACTOS) 30 MG tablet Take 1 tablet (30 mg total) by mouth daily.   No facility-administered encounter medications on file as of 09/21/2021.    ALLERGIES: Allergies  Allergen Reactions   Morphine And Related Other (See Comments)    Confusion and agitation   Penicillins Itching    VACCINATION STATUS: Immunization History  Administered Date(s) Administered   Influenza,inj,Quad PF,6+ Mos 05/02/2020   Influenza,inj,quad, With Preservative 04/08/2015   Influenza-Unspecified 04/06/2016, 04/13/2018   PFIZER(Purple Top)SARS-COV-2 Vaccination 08/13/2019, 09/08/2019, 04/17/2020   Tdap 07/05/2013   Zoster, Live 07/05/2013    Diabetes She presents for her initial diabetic visit. She has type 2 diabetes mellitus. Onset time: She was diagnosed at age 65 approximately. Her disease course has been worsening. There are no hypoglycemic associated symptoms. Pertinent negatives for hypoglycemia include no  confusion, headaches, pallor or seizures. Associated symptoms include fatigue, polydipsia and polyuria. Pertinent negatives for diabetes include no chest pain and no polyphagia. There are no hypoglycemic complications. Symptoms are worsening. Risk factors for coronary artery disease include dyslipidemia, diabetes mellitus, family history, hypertension, post-menopausal and sedentary lifestyle. Current diabetic treatments: She is currently on metformin 1000 mg p.o. twice daily, glipizide 5 mg p.o. daily, Actos 30 mg p.o. daily, Januvia 50 mg p.o. daily. Her weight is decreasing steadily. She is following a generally unhealthy diet. When asked about meal planning, she reported  none. Her home blood glucose trend is fluctuating minimally. Her breakfast blood glucose range is generally 140-180 mg/dl. Her overall blood glucose range is >200 mg/dl. (She brought a log showing fluctuating glycemic profile.  Her recent A1c was 9.1%.  She would like to avoid injectable treatments for diabetes.) An ACE inhibitor/angiotensin II receptor blocker is being taken.  Hypertension This is a chronic problem. The problem is controlled. Pertinent negatives include no chest pain, headaches, palpitations or shortness of breath. Risk factors for coronary artery disease include diabetes mellitus, dyslipidemia, sedentary lifestyle, post-menopausal state and family history. Past treatments include ACE inhibitors.  Hyperlipidemia This is a chronic problem. The problem is controlled. Exacerbating diseases include diabetes. Pertinent negatives include no chest pain, myalgias or shortness of breath. Current antihyperlipidemic treatment includes statins. Risk factors for coronary artery disease include diabetes mellitus, dyslipidemia, family history, hypertension, a sedentary lifestyle and post-menopausal.    Review of Systems  Constitutional:  Positive for fatigue. Negative for chills, fever and unexpected weight change.  HENT:  Negative  for trouble swallowing and voice change.   Eyes:  Negative for visual disturbance.  Respiratory:  Negative for cough, shortness of breath and wheezing.   Cardiovascular:  Negative for chest pain, palpitations and leg swelling.  Gastrointestinal:  Negative for diarrhea, nausea and vomiting.  Endocrine: Positive for polydipsia and polyuria. Negative for cold intolerance, heat intolerance and polyphagia.  Musculoskeletal:  Negative for arthralgias and myalgias.  Skin:  Negative for color change, pallor, rash and wound.  Neurological:  Negative for seizures and headaches.  Psychiatric/Behavioral:  Negative for confusion and suicidal ideas.    Objective:    Vitals with BMI 09/21/2021 09/04/2021 09/04/2021  Height 5\' 2"  - 5\' 2"   Weight 122 lbs - 121 lbs 1 oz  BMI 22.31 - 22.13  Systolic 116 126 -  Diastolic 68 73 -  Pulse 72 66 -    BP 116/68   Pulse 72   Ht 5\' 2"  (1.575 m)   Wt 122 lb (55.3 kg)   BMI 22.31 kg/m   Wt Readings from Last 3 Encounters:  09/21/21 122 lb (55.3 kg)  09/04/21 121 lb 0.5 oz (54.9 kg)  09/04/21 121 lb (54.9 kg)     Physical Exam Constitutional:      Appearance: She is well-developed.  HENT:     Head: Normocephalic and atraumatic.  Neck:     Thyroid: No thyromegaly.     Trachea: No tracheal deviation.  Cardiovascular:     Rate and Rhythm: Normal rate and regular rhythm.  Pulmonary:     Effort: Pulmonary effort is normal.  Abdominal:     Tenderness: There is no abdominal tenderness. There is no guarding.  Musculoskeletal:        General: Swelling present. Normal range of motion.     Cervical back: Normal range of motion and neck supple.  Skin:    General: Skin is warm and dry.     Coloration: Skin is not pale.     Findings: No erythema or rash.  Neurological:     Mental Status: She is alert and oriented to person, place, and time.     Cranial Nerves: No cranial nerve deficit.     Coordination: Coordination normal.     Deep Tendon Reflexes:  Reflexes are normal and symmetric.  Psychiatric:        Judgment: Judgment normal.      CMP ( most recent) CMP     Component Value Date/Time  NA 137 09/04/2021 1443   K 4.8 09/04/2021 1443   CL 98 09/04/2021 1443   CO2 32 09/04/2021 1443   GLUCOSE 288 (H) 09/04/2021 1443   BUN 33 (H) 09/04/2021 1443   CREATININE 0.95 09/04/2021 1443   CREATININE 0.93 (H) 03/03/2020 0927   CALCIUM 9.5 09/04/2021 1443   PROT 7.2 09/04/2021 1443   ALBUMIN 4.4 09/04/2021 1443   AST 17 09/04/2021 1443   ALT 13 09/04/2021 1443   ALKPHOS 49 09/04/2021 1443   BILITOT 0.5 09/04/2021 1443   GFRNONAA >60 07/04/2017 0631   GFRAA >60 07/04/2017 0631     Diabetic Labs (most recent): Lab Results  Component Value Date   HGBA1C 9.1 (H) 09/04/2021   HGBA1C 8.2 (H) 06/11/2021   HGBA1C 7.8 (H) 03/10/2021     Lipid Panel ( most recent) Lipid Panel     Component Value Date/Time   CHOL 141 03/10/2021 1142   TRIG 68.0 03/10/2021 1142   HDL 75.40 03/10/2021 1142   CHOLHDL 2 03/10/2021 1142   VLDL 13.6 03/10/2021 1142   LDLCALC 52 03/10/2021 1142   LDLCALC 54 03/03/2020 0927      Lab Results  Component Value Date   TSH 0.80 06/11/2021   TSH 1.31 03/10/2021   TSH 1.14 09/10/2020   TSH 0.89 03/03/2020   TSH 0.70 09/04/2019   TSH 0.94 05/25/2018           Assessment & Plan:   1. Poorly controlled type 2 diabetes mellitus (HCC)    - Melanie Hughes has currently uncontrolled symptomatic type 2 DM since   86 years of age,  with most recent A1c of 9.1 %. Recent labs reviewed. - I had a long discussion with her about the progressive nature of diabetes and the pathology behind its complications. -She does not respond reported coronary artery disease, CVA, CKD, however she remains at a high risk for more acute and chronic complications which include CAD, CVA, CKD, retinopathy, and neuropathy. These are all discussed in detail with her.  - I discussed all available options of managing her  diabetes including de-escalation of medications. I have counseled her on diet  and weight management  by adopting a Whole Food , Plant Predominant  ( WFPP) nutrition as recommended by [employer] of Lifestyle Medicine. Patient is encouraged to switch to  unprocessed or minimally processed  complex starch, adequate protein intake (mainly plant source), minimal liquid fat ( mainly vegetable oils), plenty of fruits, and vegetables. -  she is advised to stick to a routine mealtimes to eat 3 complete meals a day and snack only when necessary ( to snack only to correct hypoglycemia BG <70 day time or <100 at night).   - she acknowledges that there is a room for improvement in her food and drink choices. - Further Specific Suggestion is made for her to avoid simple carbohydrates  from her diet including Cakes, Sweet Desserts, Ice Cream, Soda (diet and regular), Sweet Tea, Candies, Chips, Cookies, Store Bought Juices, Alcohol ,  Artificial Sweeteners,  Coffee Creamer, and "Sugar-free" Products. This will help patient to have more stable blood glucose profile and potentially avoid unintended weight gain.  The following Lifestyle Medicine recommendations according to American College of Lifestyle Medicine [facility]) were discussed and offered to patient and she agrees to start the journey:  A. Whole Foods, Plant-based plate comprising of fruits and vegetables, plant-based proteins, whole-grain carbohydrates was discussed in detail with the patient.   A list for  source of those nutrients were also provided to the patient.  Patient will use only water or unsweetened tea for hydration. B.  The need to stay away from risky substances including alcohol, smoking; obtaining 7 to 9 hours of restorative sleep, at least 150 minutes of moderate intensity exercise weekly, the importance of healthy social connections,  and stress reduction techniques were discussed. C.  A full color page of  Calorie density of various food  groups per pound showing examples of each food groups was provided to the patient.  - she will be scheduled with Norm Salt, RDN, CDE for individualized diabetes education.  - I have approached her with the following plan to manage  her diabetes and patient agrees:   -She has good support in her daughter.  She wishes to avoid injectable treatment for treating diabetes.   -She is willing to start monitoring blood glucose.  She is approached to start monitoring blood glucose 4 times a day -before meals and at bedtime -and return in 10 days for evaluation.    In the meantime, she is advised to continue metformin at 500 mg p.o. twice daily, Januvia 50 mg p.o. once a day, continue glipizide 5 mg XL p.o. daily at breakfast.  She is however advised to discontinue her Actos in light of her peripheral edema.   - she is encouraged to call clinic for blood glucose levels less than 70 or above 200 mg /dl. She is not ideal candidate for GLP-1 receptor agonist nor SGLT2 inhibitors.  - Specific targets for  A1c;  LDL, HDL,  and Triglycerides were discussed with the patient.  2) Blood Pressure /Hypertension:  her blood pressure is  controlled to target.   she is advised to continue her current medications including lisinopril/HCTZ 20/12.5 mg p.o. daily with breakfast . 3) Lipids/Hyperlipidemia:   Review of her recent lipid panel showed  controlled  LDL at 52 .  she  is advised to continue    simvastatin 10 mg daily at bedtime.  Side effects and precautions discussed with her.  4)  Weight/Diet:  Body mass index is 22.31 kg/m.  -   she is not a candidate for weight loss.  Moderate exercise was recommended.  5) Chronic Care/Health Maintenance:  -she  is on ACEI/ARB and Statin medications and  is encouraged to initiate and continue to follow up with Ophthalmology, Dentist,  Podiatrist at least yearly or according to recommendations, and advised to   stay away from smoking. I have recommended yearly flu  vaccine and pneumonia vaccine at least every 5 years; moderate intensity exercise for up to 150 minutes weekly; and  sleep for 7- 9 hours a day.  - she is  advised to maintain close follow up with Pcp, No for primary care needs, as well as her other providers for optimal and coordinated care.   I spent 62 minutes in the care of the patient today including review of labs from CMP, Lipids, Thyroid Function, Hematology (current and previous including abstractions from other facilities); face-to-face time discussing  her blood glucose readings/logs, discussing hypoglycemia and hyperglycemia episodes and symptoms, medications doses, her options of short and long term treatment based on the latest standards of care / guidelines;  discussion about incorporating lifestyle medicine;  and documenting the encounter.     Please refer to Patient Instructions for Blood Glucose Monitoring and Insulin/Medications Dosing Guide"  in media tab for additional information. Please  also refer to " Patient Self Inventory" in  the Media  tab for reviewed elements of pertinent patient history.  Melanie Hughes participated in the discussions, expressed understanding, and voiced agreement with the above plans.  All questions were answered to her satisfaction. she is encouraged to contact clinic should she have any questions or concerns prior to her return visit.   Follow up plan: - Return in about 10 days (around 10/01/2021) for F/U with Meter and Logs Only - no Labs.  Marquis Lunch, MD [facility] [address] [place], [place] [address] Phone: [phone]  Fax: [phone]    09/21/2021, 12:58 PM  This note was partially dictated with voice recognition software. Similar sounding words can be transcribed inadequately or may not  be corrected upon review.

## 2021-10-01 ENCOUNTER — Encounter: Payer: Self-pay | Admitting: "Endocrinology

## 2021-10-01 ENCOUNTER — Ambulatory Visit: Payer: Medicare Other | Admitting: "Endocrinology

## 2021-10-01 VITALS — BP 122/78 | HR 72 | Ht 62.0 in | Wt 118.2 lb

## 2021-10-01 DIAGNOSIS — E782 Mixed hyperlipidemia: Secondary | ICD-10-CM | POA: Diagnosis not present

## 2021-10-01 DIAGNOSIS — I10 Essential (primary) hypertension: Secondary | ICD-10-CM

## 2021-10-01 DIAGNOSIS — E1165 Type 2 diabetes mellitus with hyperglycemia: Secondary | ICD-10-CM

## 2021-10-01 MED ORDER — CONTOUR NEXT TEST VI STRP: ORAL_STRIP | 2 refills | Status: AC

## 2021-10-01 NOTE — Progress Notes (Signed)
? ?                                              ?     10/01/2021, 1:06 PM ? ?Endocrinology follow-up note ? ? ?Subjective:  ? ? Patient ID: Melanie Hughes, female    DOB: 1935-09-19.  ?Melanie Hughes is being seen in follow-up after she was seen in consultation for management of currently uncontrolled symptomatic diabetes requested by  Jeri Modena, NP. ? ? ?Past Medical History:  ?Diagnosis Date  ? Cancer [facility])   ? breast  ? Diabetes mellitus without complication ([place])   ? High blood cholesterol   ? Hypertension   ? Osteoporosis   ? ? ?Past Surgical History:  ?Procedure Laterality Date  ? ABDOMINAL HYSTERECTOMY    ? JOINT REPLACEMENT    ? MASTECTOMY    ? ORIF ELBOW FRACTURE Left 07/01/2017  ? Procedure: OPEN REDUCTION INTERNAL FIXATION (ORIF) ELBOW/OLECRANON FRACTURE;  Surgeon: Shona Needles, MD;  Location: [place];  Service: Orthopedics;  Laterality: Left;  ? WRIST SURGERY    ? ? ?Social History  ? ?Socioeconomic History  ? Marital status: Widowed  ?  Spouse name: Not on file  ? Number of children: 3  ? Years of education: Not on file  ? Highest education level: Not on file  ?Occupational History  ? Occupation: retired  ?Tobacco Use  ? Smoking status: Never  ? Smokeless tobacco: Never  ?Vaping Use  ? Vaping Use: Never used  ?Substance and Sexual Activity  ? Alcohol use: No  ? Drug use: No  ? Sexual activity: Never  ?Other Topics Concern  ? Not on file  ?Social History Narrative  ? Not on file  ? ?Social Determinants of Health  ? ?Financial Resource Strain: Low Risk   ? Difficulty of Paying Living Expenses: Not hard at all  ?Food Insecurity: No Food Insecurity  ? Worried About Charity fundraiser in the Last Year: Never true  ? Ran Out of Food in the Last Year: Never true  ?Transportation Needs: No Transportation Needs  ? Lack of Transportation (Medical): No  ? Lack of Transportation (Non-Medical): No  ?Physical Activity: Inactive  ? Days of Exercise per Week: 0 days  ? Minutes of Exercise per  Session: 0 min  ?Stress: No Stress Concern Present  ? Feeling of Stress : Not at all  ?Social Connections: Moderately Isolated  ? Frequency of Communication with Friends and Family: More than three times a week  ? Frequency of Social Gatherings with Friends and Family: More than three times a week  ? Attends Religious Services: More than 4 times per year  ? Active Member of Clubs or Organizations: No  ? Attends Archivist Meetings: Never  ? Marital Status: Widowed  ? ? ?Family History  ?Problem Relation Age of Onset  ? Parkinson's disease Mother   ? Osteoporosis Mother   ? Cancer Father   ? Heart attack Father   ? Alzheimer's disease Sister   ? Dementia Sister   ? Obesity Grandchild   ? ? ?Outpatient Encounter Medications as of 10/01/2021  ?Medication Sig  ? aspirin EC 81 MG tablet Take 81 mg by mouth daily.  ? bisacodyl (DULCOLAX) 5 MG EC tablet Take 1 tablet (5 mg total) by mouth daily as needed for moderate constipation.  ? blood  glucose meter kit and supplies KIT Use up to 4x daily as directed---diagnosis code E11.9  ? Calcium Carb-Cholecalciferol (CALCIUM 500+D PO) Calcium 500 + D  ? carbidopa-levodopa (SINEMET IR) 25-100 MG tablet Take 1 tablet by mouth 3 (three) times daily. Take at 7 AM, 11 AM and 4 PM daily.  ? CONTOUR NEXT TEST test strip Use to test glucose 3 times daily  ? [facility] COQ-10 200 MG capsule TAKE 1 CAPSULE BY MOUTH EVERY DAY  ? escitalopram (LEXAPRO) 20 MG tablet TAKE 1 TABLET BY MOUTH EVERYDAY AT BEDTIME  ? glipiZIDE (GLUCOTROL XL) 5 MG 24 hr tablet Take 1 tablet (5 mg total) by mouth daily with breakfast.  ? JANUVIA 50 MG tablet TAKE 1 TABLET BY MOUTH EVERY DAY  ? lisinopril-hydrochlorothiazide (ZESTORETIC) 20-12.5 MG tablet TAKE 1 TABLET BY MOUTH EVERY DAY IN THE MORNING  ? LITE TOUCH LANCETS MISC   ? LORazepam (ATIVAN) 0.5 MG tablet Take 1 tablet (0.5 mg total) by mouth 2 (two) times daily. for anxiety  ? metFORMIN (GLUCOPHAGE) 500 MG tablet Take 1 tablet (500 mg total) by mouth 2  (two) times daily with a meal.  ? Multiple Vitamin (MULTIVITAMIN WITH MINERALS) TABS tablet Take 1 tablet by mouth daily.  ? nitrofurantoin, macrocrystal-monohydrate, (MACROBID) 100 MG capsule Take 1 capsule (100 mg total) by mouth 2 (two) times daily. (Patient not taking: Reported on 09/21/2021)  ? simvastatin (ZOCOR) 10 MG tablet TAKE 1 TABLET BY MOUTH EVERY DAY  ? UNABLE TO FIND nitrofurantoin monohydrate/macrocrystals 100 mg capsule (Patient not taking: Reported on 09/04/2021)  ? Vitamin D, Ergocalciferol, (DRISDOL) 1.25 MG (50000 UNIT) CAPS capsule Take 1 capsule (50,000 Units total) by mouth every 7 (seven) days. (Patient not taking: Reported on 09/04/2021)  ? [DISCONTINUED] CONTOUR NEXT TEST test strip   ? ?No facility-administered encounter medications on file as of 10/01/2021.  ? ? ?ALLERGIES: ?Allergies  ?Allergen Reactions  ? Morphine And Related Other (See Comments)  ?  Confusion and agitation  ? Penicillins Itching  ? ? ?VACCINATION STATUS: ?Immunization History  ?Administered Date(s) Administered  ? Influenza,inj,Quad PF,6+ Mos 05/02/2020  ? Influenza,inj,quad, With Preservative 04/08/2015  ? Influenza-Unspecified 04/06/2016, 04/13/2018  ? PFIZER(Purple Top)SARS-COV-2 Vaccination 08/13/2019, 09/08/2019, 04/17/2020  ? Tdap 07/05/2013  ? Zoster, Live 07/05/2013  ? ? ?Diabetes ?She presents for her follow-up diabetic visit. She has type 2 diabetes mellitus. Onset time: She was diagnosed at age 46 approximately. Her disease course has been improving. There are no hypoglycemic associated symptoms. Pertinent negatives for hypoglycemia include no confusion, headaches, pallor or seizures. Associated symptoms include fatigue. Pertinent negatives for diabetes include no chest pain, no polydipsia, no polyphagia and no polyuria. There are no hypoglycemic complications. Symptoms are improving. Risk factors for coronary artery disease include dyslipidemia, diabetes mellitus, family history, hypertension, post-menopausal  and sedentary lifestyle. Current diabetic treatments: She is currently on metformin 1000 mg p.o. twice daily, glipizide 5 mg p.o. daily, Actos 30 mg p.o. daily, Januvia 50 mg p.o. daily. Her weight is decreasing steadily. She is following a generally unhealthy diet. When asked about meal planning, she reported none. Her home blood glucose trend is fluctuating minimally. Her breakfast blood glucose range is generally 140-180 mg/dl. Her bedtime blood glucose range is generally 180-200 mg/dl. Her overall blood glucose range is 180-200 mg/dl. (Melanie Hughes presents accompanied by her daughter.  She presents her logs and meter showing significant improvement in her glycemic profile.  Her average blood glucose is 203 for the last 14 days.  No hypoglycemia detected or documented.   ?) An ACE inhibitor/angiotensin II receptor blocker is being taken.  ?Hypertension ?This is a chronic problem. The problem is controlled. Pertinent negatives include no chest pain, headaches, palpitations or shortness of breath. Risk factors for coronary artery disease include diabetes mellitus, dyslipidemia, sedentary lifestyle, post-menopausal state and family history. Past treatments include ACE inhibitors.  ?Hyperlipidemia ?This is a chronic problem. The problem is controlled. Exacerbating diseases include diabetes. Pertinent negatives include no chest pain, myalgias or shortness of breath. Current antihyperlipidemic treatment includes statins. Risk factors for coronary artery disease include diabetes mellitus, dyslipidemia, family history, hypertension, a sedentary lifestyle and post-menopausal.  ? ? ?Review of Systems  ?Constitutional:  Positive for fatigue. Negative for chills, fever and unexpected weight change.  ?HENT:  Negative for trouble swallowing and voice change.   ?Eyes:  Negative for visual disturbance.  ?Respiratory:  Negative for cough, shortness of breath and wheezing.   ?Cardiovascular:  Negative for chest pain, palpitations  and leg swelling.  ?Gastrointestinal:  Negative for diarrhea, nausea and vomiting.  ?Endocrine: Negative for cold intolerance, heat intolerance, polydipsia, polyphagia and polyuria.  ?Musculoskeletal:  Evelena Leyden

## 2021-10-01 NOTE — Patient Instructions (Signed)

## 2021-10-19 ENCOUNTER — Ambulatory Visit: Payer: Medicare Other | Admitting: Neurology

## 2021-11-09 ENCOUNTER — Other Ambulatory Visit: Payer: Self-pay | Admitting: Family

## 2021-11-24 ENCOUNTER — Other Ambulatory Visit: Payer: Self-pay | Admitting: Family

## 2021-12-15 ENCOUNTER — Ambulatory Visit: Payer: Medicare Other | Admitting: "Endocrinology

## 2023-05-06 DEATH — deceased

## 2023-12-20 IMAGING — CT CT HEAD W/O CM
3 series · 16 of 47 positions shown, 19 images · non-contrast
Comparison: 06/28/2017

CLINICAL DATA: Head trauma status post fall yesterday



[Series 2: head wo · axial · 0.41mm/px · z∈[-179,-49]mm · 10 of 32 slices shown, 13 images]
[im 3/32  brain]
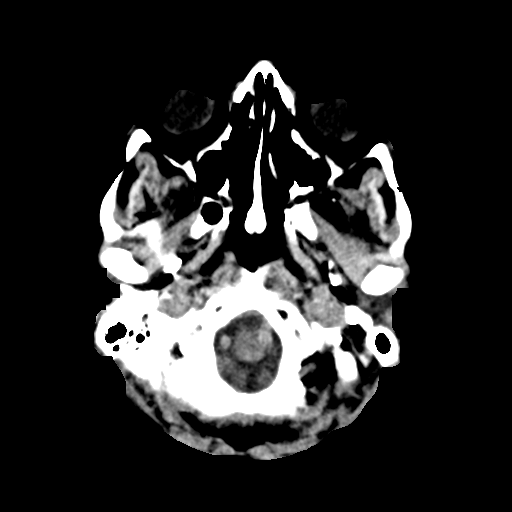
[im 3/32  bone]
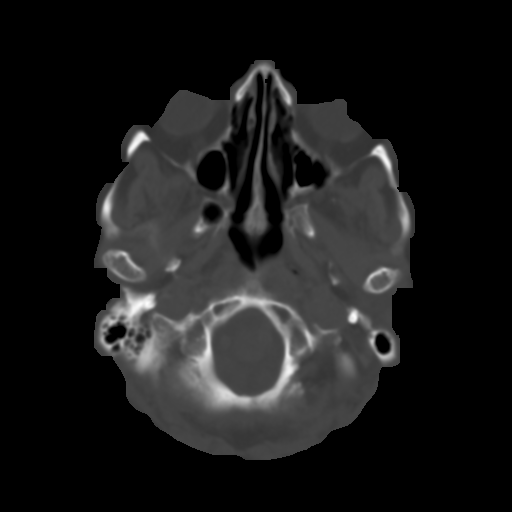
[im 6/32  brain]
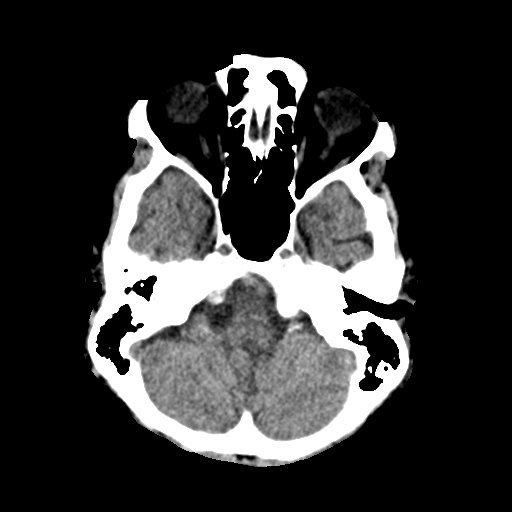
[im 9/32  brain]
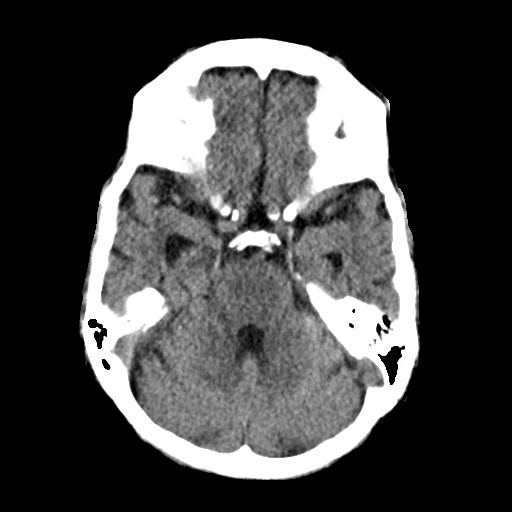
[im 11/32  brain]
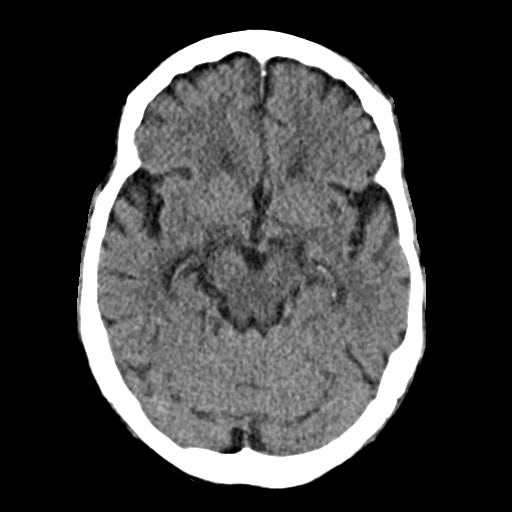
[im 14/32  brain]
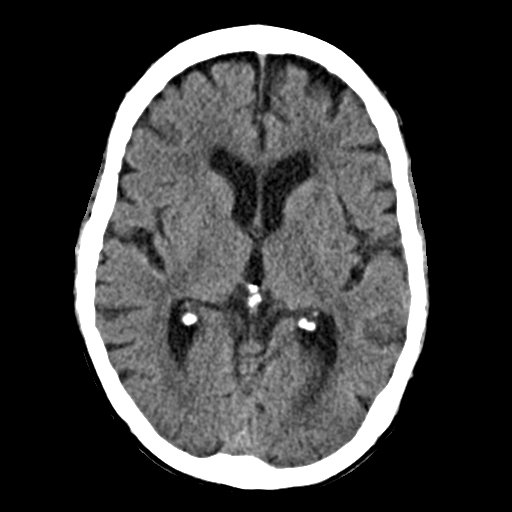
[im 14/32  bone]
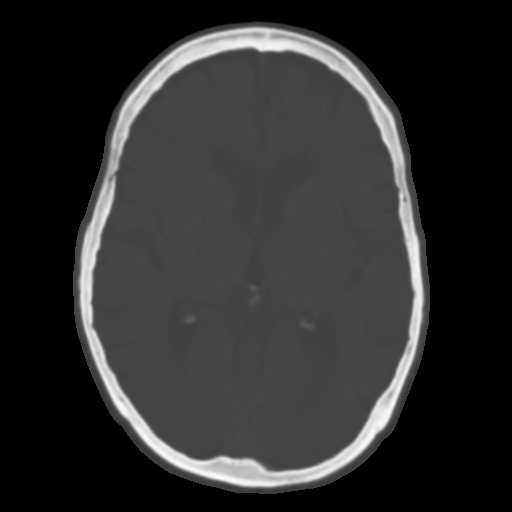
[im 18/32  brain]
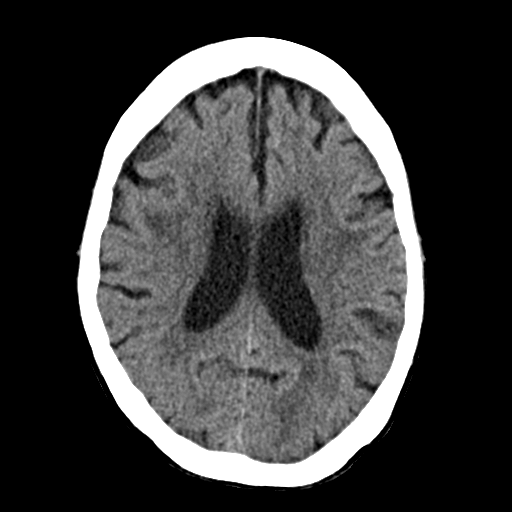
[im 21/32  brain]
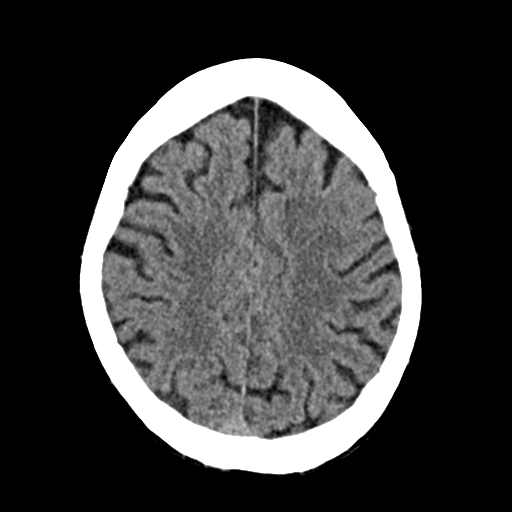
[im 24/32  brain]
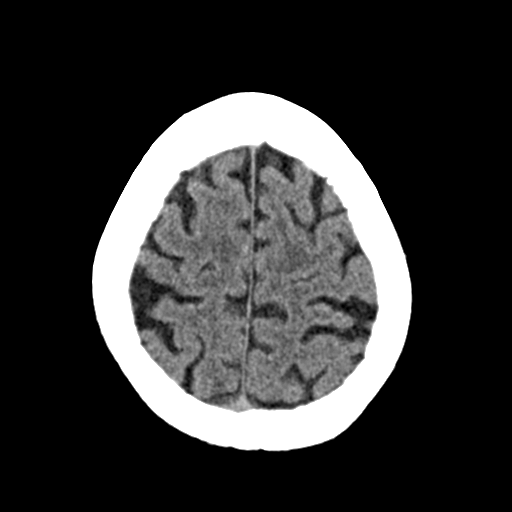
[im 26/32  brain]
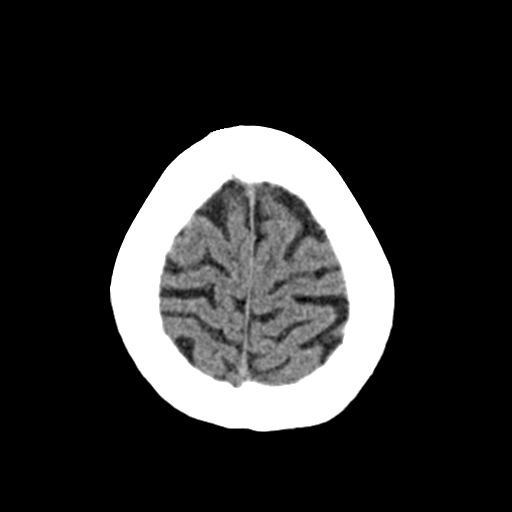
[im 26/32  bone]
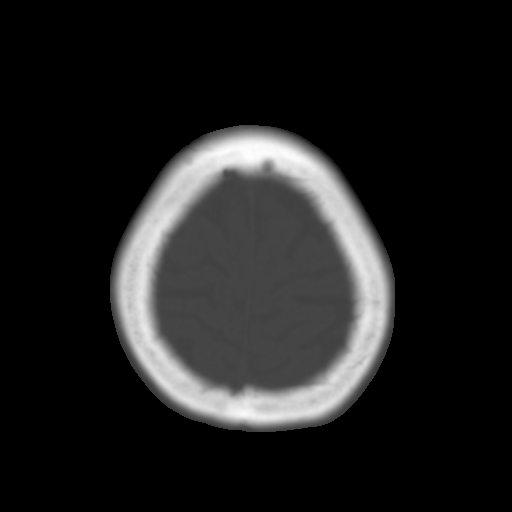
[im 29/32  brain]
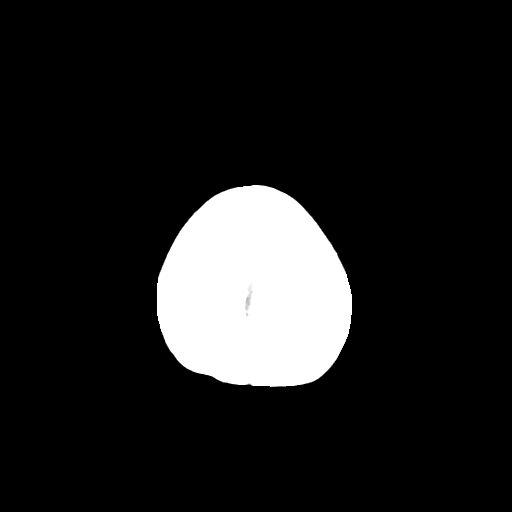

[Series 4: coronal soft · coronal · 0.32mm/px · 3 of 66 slices shown]
[im 22/66  brain]
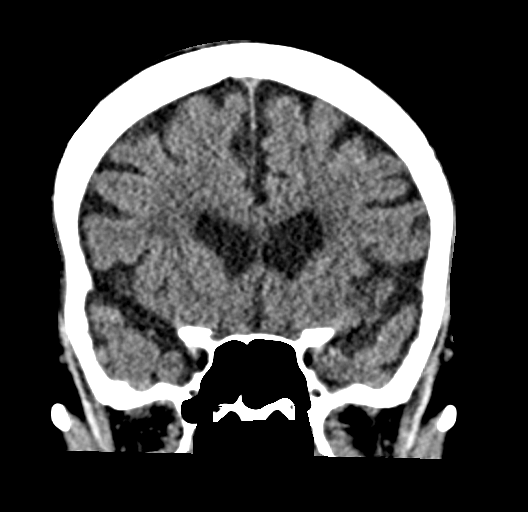
[im 29/66  brain]
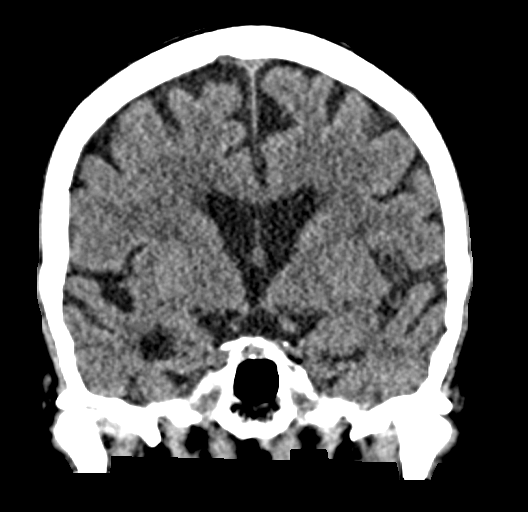
[im 37/66  brain]
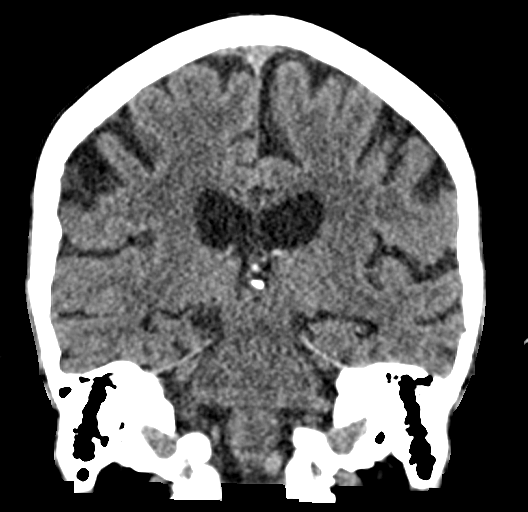

[Series 5: sag soft · sagittal · 0.32mm/px · 3 of 56 slices shown]
[im 19/56  brain]
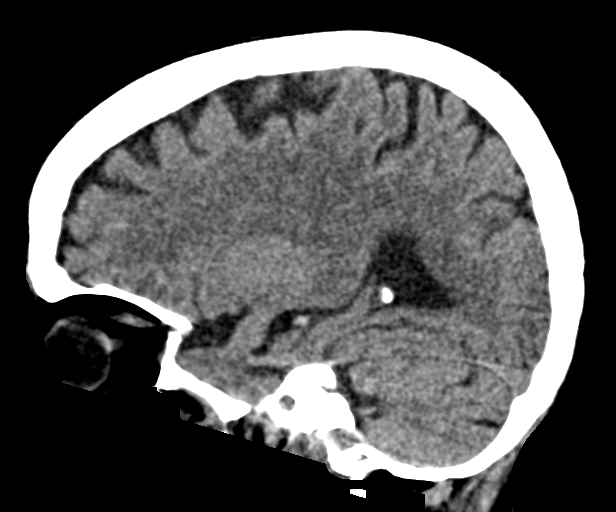
[im 28/56  brain]
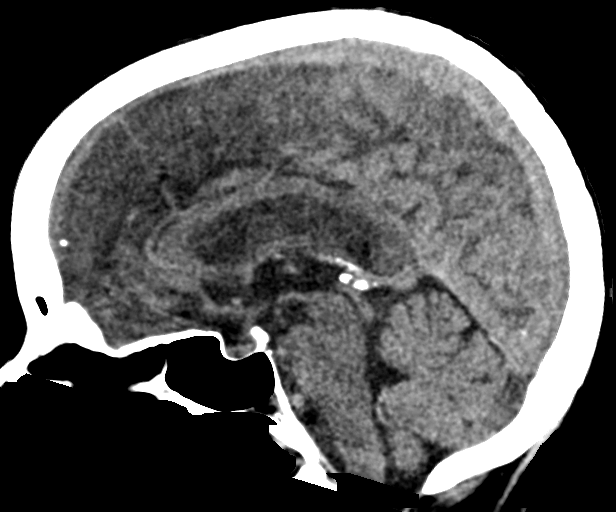
[im 37/56  brain]
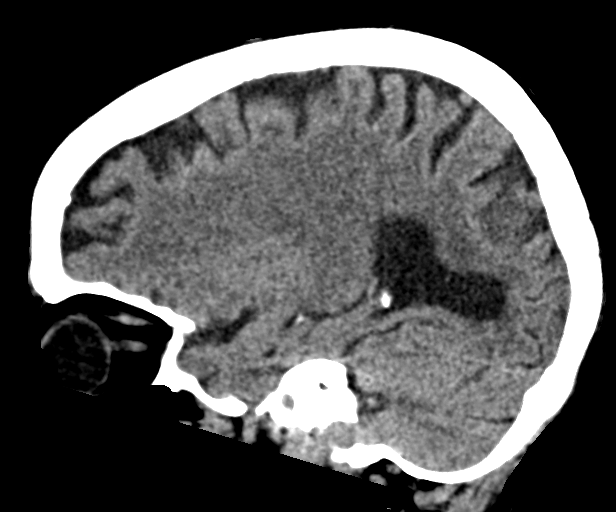

[16 of 47 positions shown; findings below may reference images not displayed]

FINDINGS: Brain: No evidence of acute infarction, hemorrhage, extra-axial
collection, ventriculomegaly, or mass effect. Generalized cerebral
atrophy. Periventricular white matter low attenuation likely
secondary to microangiopathy.

Vascular: Cerebrovascular atherosclerotic calcifications are noted.

Skull: Negative for fracture or focal lesion.

Sinuses/Orbits: Visualized portions of the orbits are unremarkable.
Visualized portions of the paranasal sinuses are unremarkable.
Visualized portions of the mastoid air cells are unremarkable.

Other: None.
IMPRESSION: 1. No acute intracranial findings.
2. Chronic small vessel ischemic disease.
